# Patient Record
Sex: Male | Born: 1935 | Race: White | Hispanic: No | Marital: Married | Smoking: Former smoker
Health system: Southern US, Community
[De-identification: ages and names within clinical notes are randomized; demographics above are authoritative.]

## PROBLEM LIST (undated history)

## (undated) DIAGNOSIS — H5440 Blindness, one eye, unspecified eye: Secondary | ICD-10-CM

## (undated) DIAGNOSIS — I10 Essential (primary) hypertension: Secondary | ICD-10-CM

## (undated) DIAGNOSIS — I2510 Atherosclerotic heart disease of native coronary artery without angina pectoris: Secondary | ICD-10-CM

## (undated) DIAGNOSIS — S065X9A Traumatic subdural hemorrhage with loss of consciousness of unspecified duration, initial encounter: Secondary | ICD-10-CM

## (undated) DIAGNOSIS — R51 Headache: Secondary | ICD-10-CM

## (undated) DIAGNOSIS — D649 Anemia, unspecified: Secondary | ICD-10-CM

## (undated) DIAGNOSIS — R569 Unspecified convulsions: Secondary | ICD-10-CM

## (undated) DIAGNOSIS — S065XAA Traumatic subdural hemorrhage with loss of consciousness status unknown, initial encounter: Secondary | ICD-10-CM

## (undated) DIAGNOSIS — E785 Hyperlipidemia, unspecified: Secondary | ICD-10-CM

## (undated) DIAGNOSIS — M1990 Unspecified osteoarthritis, unspecified site: Secondary | ICD-10-CM

## (undated) HISTORY — PX: SHOULDER ARTHROSCOPY: SHX128

## (undated) HISTORY — PX: APPENDECTOMY: SHX54

## (undated) HISTORY — PX: REPLACEMENT TOTAL KNEE BILATERAL: SUR1225

---

## 1974-01-31 HISTORY — PX: OTHER SURGICAL HISTORY: SHX169

## 1979-02-01 HISTORY — PX: KNEE ARTHROSCOPY: SUR90

## 1987-02-01 HISTORY — PX: RETINAL DETACHMENT SURGERY: SHX105

## 1993-01-31 HISTORY — PX: CATARACT EXTRACTION: SUR2

## 1996-02-01 HISTORY — PX: KNEE ARTHROSCOPY: SUR90

## 1996-02-01 HISTORY — PX: KNEE SURGERY: SHX244

## 1997-05-01 ENCOUNTER — Encounter
Admission: RE | Admit: 1997-05-01 | Discharge: 1997-07-30 | Payer: Self-pay | Admitting: Physical Medicine and Rehabilitation

## 1998-10-10 ENCOUNTER — Ambulatory Visit (HOSPITAL_COMMUNITY): Admission: RE | Admit: 1998-10-10 | Discharge: 1998-10-10 | Payer: Self-pay | Admitting: *Deleted

## 1998-10-10 ENCOUNTER — Encounter: Payer: Self-pay | Admitting: *Deleted

## 1998-12-28 ENCOUNTER — Ambulatory Visit (HOSPITAL_BASED_OUTPATIENT_CLINIC_OR_DEPARTMENT_OTHER): Admission: RE | Admit: 1998-12-28 | Discharge: 1998-12-28 | Payer: Self-pay | Admitting: *Deleted

## 1999-02-01 HISTORY — PX: OTHER SURGICAL HISTORY: SHX169

## 1999-02-18 ENCOUNTER — Encounter: Admission: RE | Admit: 1999-02-18 | Discharge: 1999-03-15 | Payer: Self-pay | Admitting: *Deleted

## 1999-07-23 ENCOUNTER — Ambulatory Visit (HOSPITAL_COMMUNITY): Admission: RE | Admit: 1999-07-23 | Discharge: 1999-07-23 | Payer: Self-pay | Admitting: Cardiovascular Disease

## 1999-07-23 ENCOUNTER — Encounter: Payer: Self-pay | Admitting: Cardiovascular Disease

## 1999-12-24 ENCOUNTER — Encounter: Payer: Self-pay | Admitting: Orthopedic Surgery

## 1999-12-24 ENCOUNTER — Ambulatory Visit (HOSPITAL_COMMUNITY): Admission: RE | Admit: 1999-12-24 | Discharge: 1999-12-24 | Payer: Self-pay | Admitting: Orthopedic Surgery

## 2001-01-31 HISTORY — PX: KNEE SURGERY: SHX244

## 2001-07-30 ENCOUNTER — Inpatient Hospital Stay (HOSPITAL_COMMUNITY): Admission: RE | Admit: 2001-07-30 | Discharge: 2001-08-03 | Payer: Self-pay | Admitting: Orthopedic Surgery

## 2001-07-30 ENCOUNTER — Encounter: Payer: Self-pay | Admitting: Orthopedic Surgery

## 2003-02-01 HISTORY — PX: PENILE PROSTHESIS IMPLANT: SHX240

## 2003-04-28 ENCOUNTER — Observation Stay (HOSPITAL_COMMUNITY): Admission: RE | Admit: 2003-04-28 | Discharge: 2003-04-29 | Payer: Self-pay | Admitting: Urology

## 2003-11-12 ENCOUNTER — Observation Stay (HOSPITAL_COMMUNITY): Admission: RE | Admit: 2003-11-12 | Discharge: 2003-11-13 | Payer: Self-pay | Admitting: Orthopedic Surgery

## 2004-02-11 ENCOUNTER — Observation Stay (HOSPITAL_COMMUNITY): Admission: RE | Admit: 2004-02-11 | Discharge: 2004-02-12 | Payer: Self-pay | Admitting: Orthopedic Surgery

## 2004-11-26 ENCOUNTER — Encounter: Admission: RE | Admit: 2004-11-26 | Discharge: 2004-11-26 | Payer: Self-pay | Admitting: Oral Surgery

## 2005-01-04 ENCOUNTER — Ambulatory Visit (HOSPITAL_BASED_OUTPATIENT_CLINIC_OR_DEPARTMENT_OTHER): Admission: RE | Admit: 2005-01-04 | Discharge: 2005-01-04 | Payer: Self-pay | Admitting: Oral Surgery

## 2005-01-04 ENCOUNTER — Ambulatory Visit (HOSPITAL_COMMUNITY): Admission: RE | Admit: 2005-01-04 | Discharge: 2005-01-04 | Payer: Self-pay | Admitting: Oral Surgery

## 2006-05-29 ENCOUNTER — Ambulatory Visit: Payer: Self-pay | Admitting: Gastroenterology

## 2006-05-29 LAB — CONVERTED CEMR LAB
Basophils Absolute: 0 10*3/uL (ref 0.0–0.1)
Hemoglobin: 11 g/dL — ABNORMAL LOW (ref 13.0–17.0)
MCHC: 34.1 g/dL (ref 30.0–36.0)
Monocytes Absolute: 0.7 10*3/uL (ref 0.2–0.7)
Monocytes Relative: 10.5 % (ref 3.0–11.0)
RDW: 14.2 % (ref 11.5–14.6)
Transferrin: 371.6 mg/dL — ABNORMAL HIGH (ref >=212.0)
Vitamin B-12: 57 pg/mL — ABNORMAL LOW (ref 211–911)

## 2006-06-05 ENCOUNTER — Ambulatory Visit: Payer: Self-pay | Admitting: Gastroenterology

## 2006-06-12 ENCOUNTER — Ambulatory Visit: Payer: Self-pay | Admitting: Gastroenterology

## 2006-06-16 ENCOUNTER — Ambulatory Visit: Payer: Self-pay | Admitting: Gastroenterology

## 2006-06-19 ENCOUNTER — Ambulatory Visit: Payer: Self-pay | Admitting: Gastroenterology

## 2006-06-27 ENCOUNTER — Ambulatory Visit: Payer: Self-pay | Admitting: Gastroenterology

## 2006-07-13 ENCOUNTER — Encounter (INDEPENDENT_AMBULATORY_CARE_PROVIDER_SITE_OTHER): Payer: Self-pay | Admitting: Gastroenterology

## 2006-07-13 ENCOUNTER — Ambulatory Visit: Payer: Self-pay | Admitting: Gastroenterology

## 2006-07-28 ENCOUNTER — Ambulatory Visit: Payer: Self-pay | Admitting: Gastroenterology

## 2006-08-25 ENCOUNTER — Ambulatory Visit: Payer: Self-pay | Admitting: Internal Medicine

## 2006-09-25 ENCOUNTER — Ambulatory Visit: Payer: Self-pay | Admitting: Internal Medicine

## 2007-05-03 DIAGNOSIS — J301 Allergic rhinitis due to pollen: Secondary | ICD-10-CM

## 2007-05-03 DIAGNOSIS — J4489 Other specified chronic obstructive pulmonary disease: Secondary | ICD-10-CM | POA: Insufficient documentation

## 2007-05-03 DIAGNOSIS — J45909 Unspecified asthma, uncomplicated: Secondary | ICD-10-CM | POA: Insufficient documentation

## 2007-05-03 DIAGNOSIS — K449 Diaphragmatic hernia without obstruction or gangrene: Secondary | ICD-10-CM | POA: Insufficient documentation

## 2007-05-03 DIAGNOSIS — D649 Anemia, unspecified: Secondary | ICD-10-CM

## 2007-05-03 DIAGNOSIS — K222 Esophageal obstruction: Secondary | ICD-10-CM

## 2007-05-03 DIAGNOSIS — J449 Chronic obstructive pulmonary disease, unspecified: Secondary | ICD-10-CM

## 2007-05-03 DIAGNOSIS — E119 Type 2 diabetes mellitus without complications: Secondary | ICD-10-CM | POA: Insufficient documentation

## 2007-05-03 DIAGNOSIS — K5730 Diverticulosis of large intestine without perforation or abscess without bleeding: Secondary | ICD-10-CM | POA: Insufficient documentation

## 2007-05-03 DIAGNOSIS — R32 Unspecified urinary incontinence: Secondary | ICD-10-CM

## 2007-09-27 ENCOUNTER — Encounter: Payer: Self-pay | Admitting: Emergency Medicine

## 2007-09-27 ENCOUNTER — Inpatient Hospital Stay (HOSPITAL_COMMUNITY): Admission: AD | Admit: 2007-09-27 | Discharge: 2007-10-01 | Payer: Self-pay | Admitting: Cardiovascular Disease

## 2007-11-08 ENCOUNTER — Encounter (HOSPITAL_COMMUNITY): Admission: RE | Admit: 2007-11-08 | Discharge: 2008-01-30 | Payer: Self-pay | Admitting: Cardiovascular Disease

## 2008-02-01 ENCOUNTER — Encounter (HOSPITAL_COMMUNITY): Admission: RE | Admit: 2008-02-01 | Discharge: 2008-02-15 | Payer: Self-pay | Admitting: Cardiovascular Disease

## 2008-03-26 ENCOUNTER — Inpatient Hospital Stay (HOSPITAL_COMMUNITY): Admission: AD | Admit: 2008-03-26 | Discharge: 2008-04-14 | Payer: Self-pay | Admitting: Neurosurgery

## 2008-03-26 ENCOUNTER — Ambulatory Visit: Payer: Self-pay | Admitting: Critical Care Medicine

## 2008-03-26 ENCOUNTER — Encounter: Payer: Self-pay | Admitting: Emergency Medicine

## 2008-04-03 ENCOUNTER — Encounter (INDEPENDENT_AMBULATORY_CARE_PROVIDER_SITE_OTHER): Payer: Self-pay | Admitting: Neurosurgery

## 2008-04-10 ENCOUNTER — Ambulatory Visit: Payer: Self-pay | Admitting: Physical Medicine & Rehabilitation

## 2008-04-14 ENCOUNTER — Ambulatory Visit: Payer: Self-pay | Admitting: Physical Medicine & Rehabilitation

## 2008-04-14 ENCOUNTER — Inpatient Hospital Stay (HOSPITAL_COMMUNITY)
Admission: RE | Admit: 2008-04-14 | Discharge: 2008-04-30 | Payer: Self-pay | Admitting: Physical Medicine & Rehabilitation

## 2008-12-05 ENCOUNTER — Inpatient Hospital Stay (HOSPITAL_COMMUNITY): Admission: EM | Admit: 2008-12-05 | Discharge: 2008-12-09 | Payer: Self-pay | Admitting: Emergency Medicine

## 2008-12-05 ENCOUNTER — Encounter (INDEPENDENT_AMBULATORY_CARE_PROVIDER_SITE_OTHER): Payer: Self-pay | Admitting: Internal Medicine

## 2008-12-05 ENCOUNTER — Ambulatory Visit: Payer: Self-pay | Admitting: Vascular Surgery

## 2009-07-09 ENCOUNTER — Encounter (INDEPENDENT_AMBULATORY_CARE_PROVIDER_SITE_OTHER): Payer: Self-pay | Admitting: *Deleted

## 2010-03-02 NOTE — Letter (Signed)
Summary: Colonoscopy Letter  [facility]  [address] [place], [place] [address]   Phone: [phone]  Fax: [phone]      July 09, 2009 MRN: [id number]   [name] Walker [address] [place], [place]  [address]   Dear Mr. STOLZ,   According to your medical record, it is time for you to schedule a Colonoscopy. The American Cancer Society recommends this procedure as a method to detect early colon cancer. Patients with a family history of colon cancer, or a personal history of colon polyps or inflammatory bowel disease are at increased risk.  This letter has beeen generated based on the recommendations made at the time of your procedure. If you feel that in your particular situation this may no longer apply, please contact our office.  Please call our office at [phone] to schedule this appointment or to update your records at your earliest convenience.  Thank you for cooperating with [place] to provide you with the very best care possible.   Sincerely,   Iva Boop, M.D.  [facility] Gastroenterology Division [phone]

## 2010-03-16 IMAGING — CR DG THORACIC SPINE 2V
4 series · 4 of 4 positions shown · non-contrast
Comparison: CT chest 11/24/2008.

CLINICAL DATA: Seizure.  Pain.  Question fracture.

THORACIC SPINE - 2 VIEW

[t t-spine a.p.]
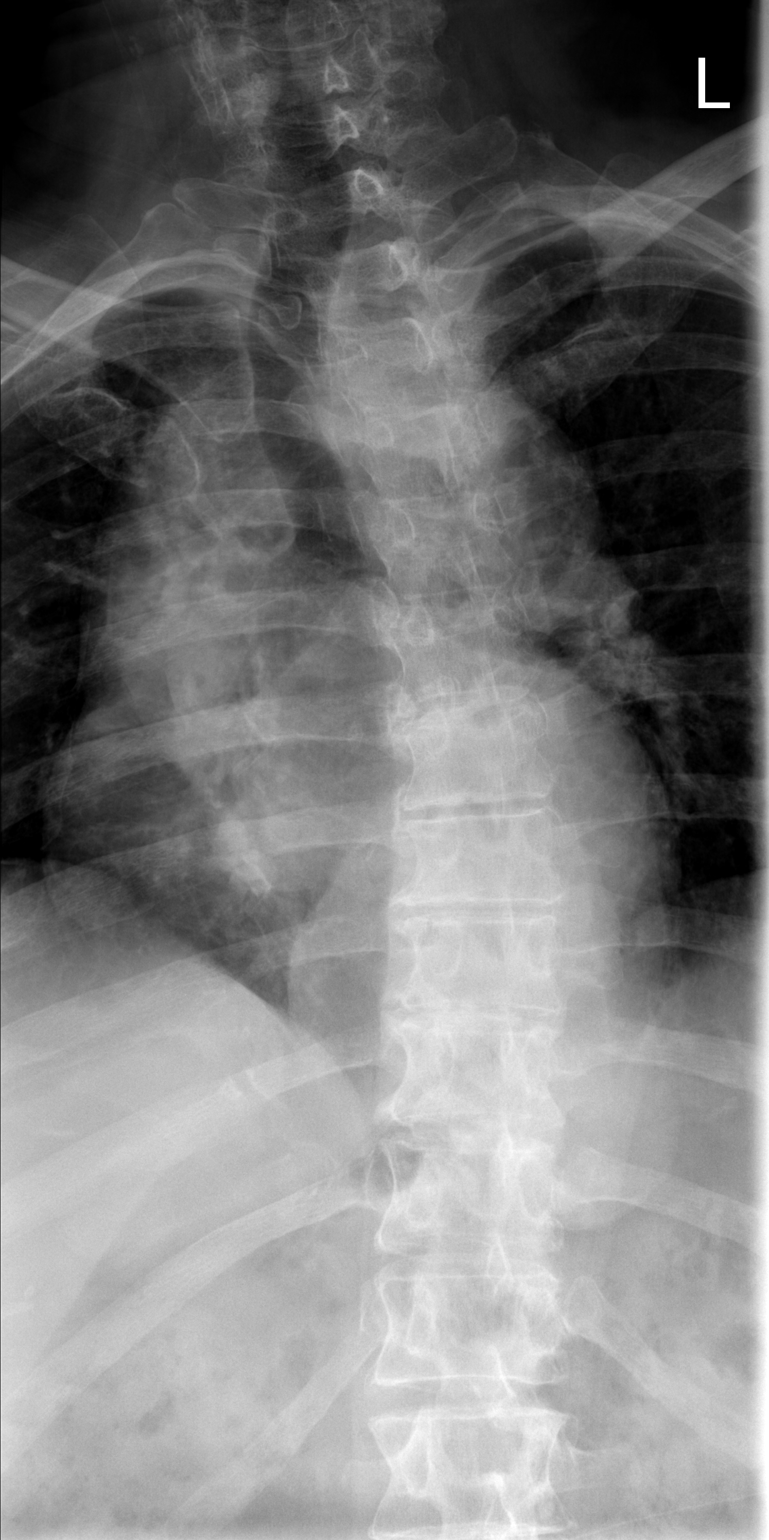

[t t-spine lat * (1 of 2)]
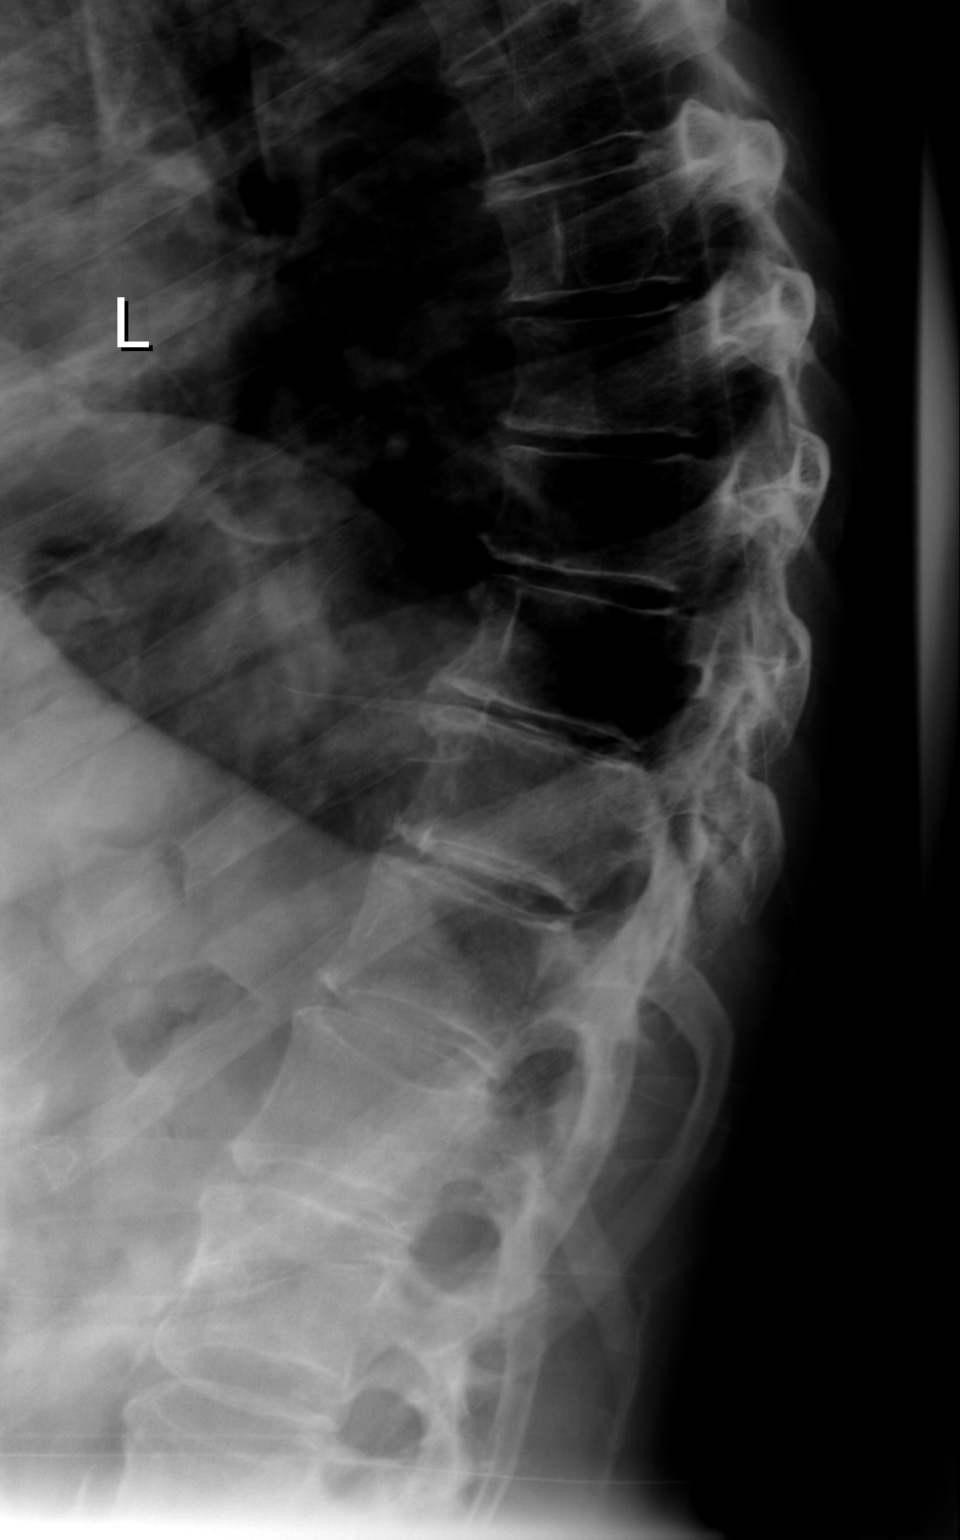

[t t-spine lat * (2 of 2)]
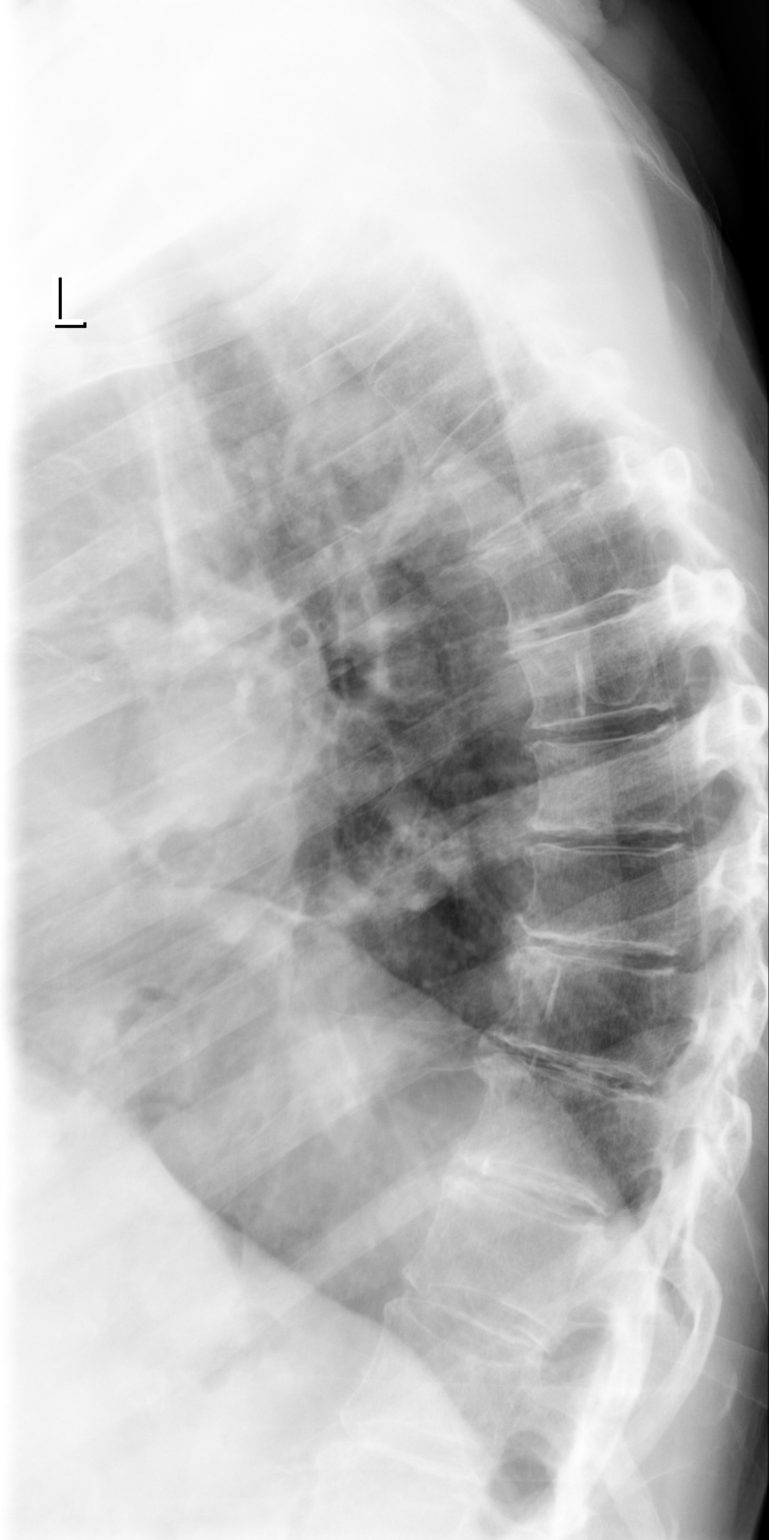

[t swimmers *]
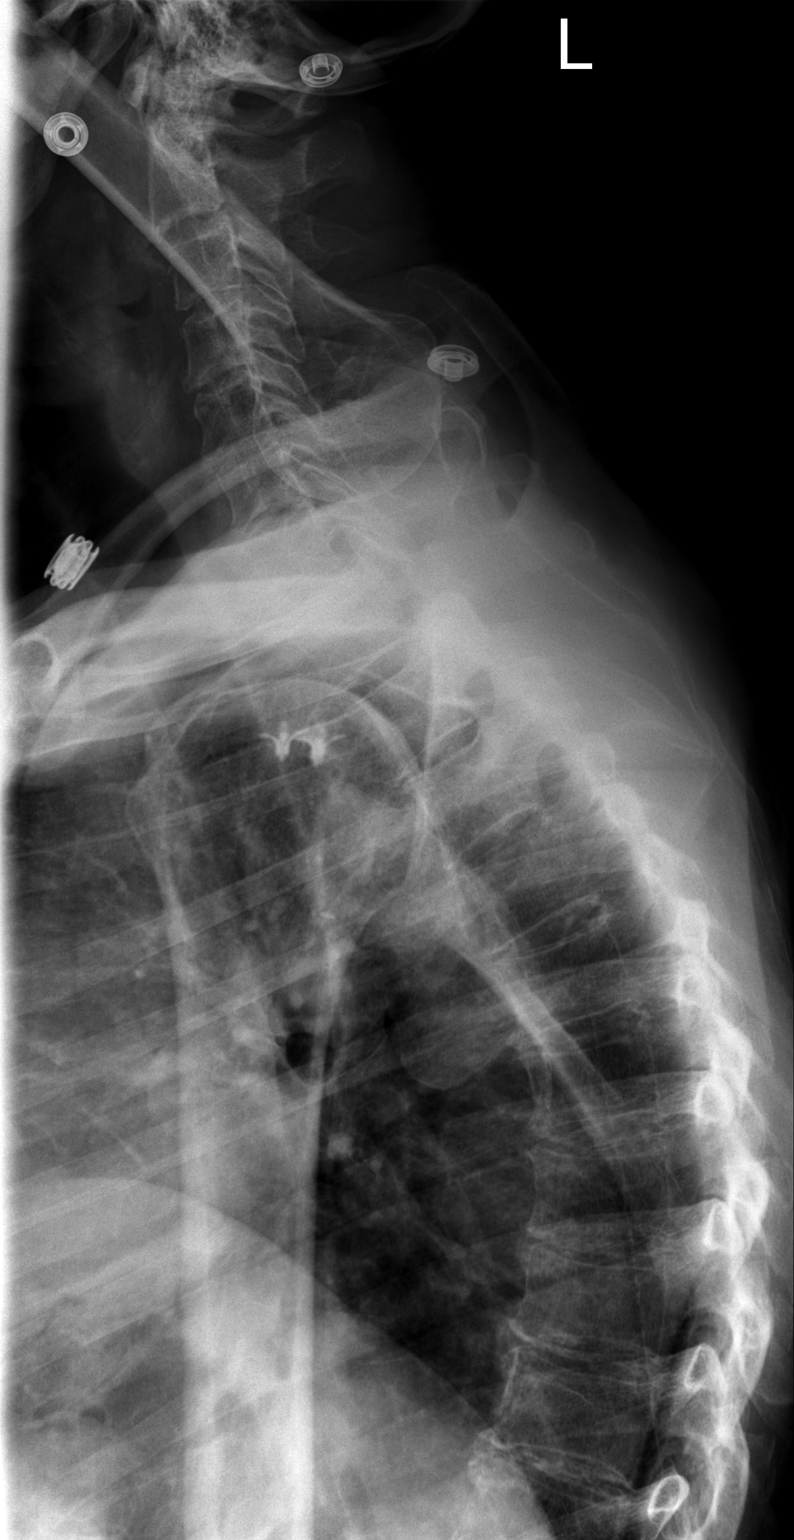

[4 of 4 positions shown; findings below may reference images not displayed]

FINDINGS: Scoliosis is noted.  There is no fracture or subluxation.
Multilevel anterior endplate spurring compatible with degenerative
disease noted.
IMPRESSION: 1.  Negative for fracture or focal abnormality.
2.  Scoliosis and spondylosis.

## 2010-05-05 LAB — COMPREHENSIVE METABOLIC PANEL
ALT: 13 U/L (ref 0–53)
ALT: 15 U/L (ref 0–53)
AST: 22 U/L (ref 0–37)
Albumin: 3.6 g/dL (ref 3.5–5.2)
Albumin: 4.1 g/dL (ref 3.5–5.2)
Alkaline Phosphatase: 51 U/L (ref 39–117)
Alkaline Phosphatase: 59 U/L (ref 39–117)
BUN: 13 mg/dL (ref 6–23)
CO2: 16 mEq/L — ABNORMAL LOW (ref 19–32)
Chloride: 102 mEq/L (ref 96–112)
Chloride: 95 mEq/L — ABNORMAL LOW (ref 96–112)
GFR calc Af Amer: >60 mL/min (ref >60)
GFR calc non Af Amer: >60 mL/min (ref >60)
Glucose, Bld: 590 mg/dL (ref 70–99)
Potassium: 3.9 mEq/L (ref 3.5–5.1)
Potassium: 4.8 mEq/L (ref 3.5–5.1)
Sodium: 132 mEq/L — ABNORMAL LOW (ref 135–145)
Total Bilirubin: 1.1 mg/dL (ref 0.3–1.2)
Total Bilirubin: 1.8 mg/dL — ABNORMAL HIGH (ref 0.3–1.2)

## 2010-05-05 LAB — POCT I-STAT, CHEM 8
BUN: 14 mg/dL (ref 6–23)
Calcium, Ion: 1.17 mmol/L (ref 1.12–1.32)
Glucose, Bld: 586 mg/dL (ref 70–99)
TCO2: 14 mmol/L (ref 0–100)

## 2010-05-05 LAB — URINE MICROSCOPIC-ADD ON

## 2010-05-05 LAB — GLUCOSE, CAPILLARY
Glucose-Capillary: 123 mg/dL — ABNORMAL HIGH (ref 70–99)
Glucose-Capillary: 124 mg/dL — ABNORMAL HIGH (ref 70–99)
Glucose-Capillary: 131 mg/dL — ABNORMAL HIGH (ref 70–99)
Glucose-Capillary: 147 mg/dL — ABNORMAL HIGH (ref 70–99)
Glucose-Capillary: 152 mg/dL — ABNORMAL HIGH (ref 70–99)
Glucose-Capillary: 164 mg/dL — ABNORMAL HIGH (ref 70–99)
Glucose-Capillary: 165 mg/dL — ABNORMAL HIGH (ref 70–99)
Glucose-Capillary: 202 mg/dL — ABNORMAL HIGH (ref 70–99)
Glucose-Capillary: 216 mg/dL — ABNORMAL HIGH (ref 70–99)
Glucose-Capillary: 254 mg/dL — ABNORMAL HIGH (ref 70–99)
Glucose-Capillary: 325 mg/dL — ABNORMAL HIGH (ref 70–99)
Glucose-Capillary: 339 mg/dL — ABNORMAL HIGH (ref 70–99)
Glucose-Capillary: 365 mg/dL — ABNORMAL HIGH (ref 70–99)
Glucose-Capillary: 565 mg/dL (ref 70–99)

## 2010-05-05 LAB — URINALYSIS, ROUTINE W REFLEX MICROSCOPIC
Leukocytes, UA: NEGATIVE
Nitrite: NEGATIVE
Specific Gravity, Urine: 1.035 — ABNORMAL HIGH (ref 1.005–1.030)
Urobilinogen, UA: 0.2 mg/dL (ref 0.0–1.0)

## 2010-05-05 LAB — BASIC METABOLIC PANEL
BUN: 7 mg/dL (ref 6–23)
CO2: 26 mEq/L (ref 19–32)
Chloride: 109 mEq/L (ref 96–112)
Creatinine, Ser: 0.92 mg/dL (ref 0.4–1.5)
GFR calc Af Amer: >60 mL/min (ref >60)

## 2010-05-05 LAB — APTT: aPTT: 26 seconds (ref 24–37)

## 2010-05-05 LAB — DIFFERENTIAL
Basophils Absolute: 0 10*3/uL (ref 0.0–0.1)
Basophils Relative: 0 % (ref 0–1)
Eosinophils Absolute: 0 10*3/uL (ref 0.0–0.7)
Monocytes Absolute: 1.1 10*3/uL — ABNORMAL HIGH (ref 0.1–1.0)
Neutro Abs: 9.6 10*3/uL — ABNORMAL HIGH (ref 1.7–7.7)
Neutrophils Relative %: 85 % — ABNORMAL HIGH (ref 43–77)

## 2010-05-05 LAB — CBC
HCT: 32.4 % — ABNORMAL LOW (ref 39.0–52.0)
HCT: 37.9 % — ABNORMAL LOW (ref 39.0–52.0)
Hemoglobin: 13.1 g/dL (ref 13.0–17.0)
MCHC: 34.8 g/dL (ref 30.0–36.0)
MCV: 93.9 fL (ref 78.0–100.0)
MCV: 94.5 fL (ref 78.0–100.0)
Platelets: 311 10*3/uL (ref 150–400)
RBC: 3.43 MIL/uL — ABNORMAL LOW (ref 4.22–5.81)
RBC: 3.76 MIL/uL — ABNORMAL LOW (ref 4.22–5.81)
WBC: 10.8 10*3/uL — ABNORMAL HIGH (ref 4.0–10.5)
WBC: 11.4 10*3/uL — ABNORMAL HIGH (ref 4.0–10.5)

## 2010-05-05 LAB — URINE CULTURE

## 2010-05-13 LAB — BASIC METABOLIC PANEL
BUN: 6 mg/dL (ref 6–23)
BUN: 11 mg/dL (ref 6–23)
BUN: 7 mg/dL (ref 6–23)
BUN: 8 mg/dL (ref 6–23)
BUN: 8 mg/dL (ref 6–23)
BUN: 9 mg/dL (ref 6–23)
CO2: 22 mEq/L (ref 19–32)
CO2: 18 mEq/L — ABNORMAL LOW (ref 19–32)
CO2: 21 mEq/L (ref 19–32)
CO2: 22 mEq/L (ref 19–32)
Calcium: 8.7 mg/dL (ref 8.4–10.5)
Calcium: 8.2 mg/dL — ABNORMAL LOW (ref 8.4–10.5)
Calcium: 8.5 mg/dL (ref 8.4–10.5)
Calcium: 8.6 mg/dL (ref 8.4–10.5)
Calcium: 8.7 mg/dL (ref 8.4–10.5)
Calcium: 9.3 mg/dL (ref 8.4–10.5)
Chloride: 102 mEq/L (ref 96–112)
Chloride: 105 mEq/L (ref 96–112)
Creatinine, Ser: 0.65 mg/dL (ref 0.4–1.5)
Creatinine, Ser: 0.96 mg/dL (ref 0.4–1.5)
Creatinine, Ser: 0.97 mg/dL (ref 0.4–1.5)
GFR calc Af Amer: >60 mL/min (ref >60)
GFR calc Af Amer: >60 mL/min (ref >60)
GFR calc Af Amer: >60 mL/min (ref >60)
GFR calc Af Amer: >60 mL/min (ref >60)
GFR calc Af Amer: >60 mL/min (ref >60)
GFR calc non Af Amer: >60 mL/min (ref >60)
GFR calc non Af Amer: >60 mL/min (ref >60)
GFR calc non Af Amer: >60 mL/min (ref >60)
GFR calc non Af Amer: >60 mL/min (ref >60)
GFR calc non Af Amer: >60 mL/min (ref >60)
GFR calc non Af Amer: >60 mL/min (ref >60)
GFR calc non Af Amer: >60 mL/min (ref >60)
GFR calc non Af Amer: >60 mL/min (ref >60)
GFR calc non Af Amer: >60 mL/min (ref >60)
Glucose, Bld: 167 mg/dL — ABNORMAL HIGH (ref 70–99)
Glucose, Bld: 192 mg/dL — ABNORMAL HIGH (ref 70–99)
Glucose, Bld: 100 mg/dL — ABNORMAL HIGH (ref 70–99)
Glucose, Bld: 142 mg/dL — ABNORMAL HIGH (ref 70–99)
Glucose, Bld: 164 mg/dL — ABNORMAL HIGH (ref 70–99)
Glucose, Bld: 199 mg/dL — ABNORMAL HIGH (ref 70–99)
Glucose, Bld: 232 mg/dL — ABNORMAL HIGH (ref 70–99)
Glucose, Bld: 239 mg/dL — ABNORMAL HIGH (ref 70–99)
Potassium: 3.6 mEq/L (ref 3.5–5.1)
Potassium: 4.3 mEq/L (ref 3.5–5.1)
Potassium: 3.1 mEq/L — ABNORMAL LOW (ref 3.5–5.1)
Potassium: 3.2 mEq/L — ABNORMAL LOW (ref 3.5–5.1)
Potassium: 3.5 mEq/L (ref 3.5–5.1)
Potassium: 3.5 mEq/L (ref 3.5–5.1)
Potassium: 3.9 mEq/L (ref 3.5–5.1)
Sodium: 137 mEq/L (ref 135–145)
Sodium: 135 mEq/L (ref 135–145)
Sodium: 136 mEq/L (ref 135–145)
Sodium: 140 mEq/L (ref 135–145)
Sodium: 141 mEq/L (ref 135–145)

## 2010-05-13 LAB — COMPREHENSIVE METABOLIC PANEL
ALT: 95 U/L — ABNORMAL HIGH (ref 0–53)
ALT: 21 U/L (ref 0–53)
AST: 85 U/L — ABNORMAL HIGH (ref 0–37)
AST: 22 U/L (ref 0–37)
Albumin: 3 g/dL — ABNORMAL LOW (ref 3.5–5.2)
Alkaline Phosphatase: 40 U/L (ref 39–117)
BUN: 9 mg/dL (ref 6–23)
CO2: 21 mEq/L (ref 19–32)
CO2: 15 mEq/L — ABNORMAL LOW (ref 19–32)
Calcium: 9.2 mg/dL (ref 8.4–10.5)
Chloride: 106 mEq/L (ref 96–112)
Chloride: 109 mEq/L (ref 96–112)
Creatinine, Ser: 0.87 mg/dL (ref 0.4–1.5)
GFR calc Af Amer: >60 mL/min (ref >60)
GFR calc Af Amer: >60 mL/min (ref >60)
GFR calc non Af Amer: >60 mL/min (ref >60)
GFR calc non Af Amer: >60 mL/min (ref >60)
Glucose, Bld: 238 mg/dL — ABNORMAL HIGH (ref 70–99)
Potassium: 4.7 mEq/L (ref 3.5–5.1)
Sodium: 140 mEq/L (ref 135–145)
Sodium: 137 mEq/L (ref 135–145)
Total Bilirubin: 0.8 mg/dL (ref 0.3–1.2)
Total Bilirubin: 1.1 mg/dL (ref 0.3–1.2)
Total Protein: 6.7 g/dL (ref 6.0–8.3)

## 2010-05-13 LAB — GLUCOSE, CAPILLARY
Glucose-Capillary: 114 mg/dL — ABNORMAL HIGH (ref 70–99)
Glucose-Capillary: 120 mg/dL — ABNORMAL HIGH (ref 70–99)
Glucose-Capillary: 126 mg/dL — ABNORMAL HIGH (ref 70–99)
Glucose-Capillary: 132 mg/dL — ABNORMAL HIGH (ref 70–99)
Glucose-Capillary: 133 mg/dL — ABNORMAL HIGH (ref 70–99)
Glucose-Capillary: 136 mg/dL — ABNORMAL HIGH (ref 70–99)
Glucose-Capillary: 137 mg/dL — ABNORMAL HIGH (ref 70–99)
Glucose-Capillary: 139 mg/dL — ABNORMAL HIGH (ref 70–99)
Glucose-Capillary: 143 mg/dL — ABNORMAL HIGH (ref 70–99)
Glucose-Capillary: 144 mg/dL — ABNORMAL HIGH (ref 70–99)
Glucose-Capillary: 149 mg/dL — ABNORMAL HIGH (ref 70–99)
Glucose-Capillary: 149 mg/dL — ABNORMAL HIGH (ref 70–99)
Glucose-Capillary: 151 mg/dL — ABNORMAL HIGH (ref 70–99)
Glucose-Capillary: 162 mg/dL — ABNORMAL HIGH (ref 70–99)
Glucose-Capillary: 164 mg/dL — ABNORMAL HIGH (ref 70–99)
Glucose-Capillary: 170 mg/dL — ABNORMAL HIGH (ref 70–99)
Glucose-Capillary: 170 mg/dL — ABNORMAL HIGH (ref 70–99)
Glucose-Capillary: 172 mg/dL — ABNORMAL HIGH (ref 70–99)
Glucose-Capillary: 174 mg/dL — ABNORMAL HIGH (ref 70–99)
Glucose-Capillary: 183 mg/dL — ABNORMAL HIGH (ref 70–99)
Glucose-Capillary: 185 mg/dL — ABNORMAL HIGH (ref 70–99)
Glucose-Capillary: 186 mg/dL — ABNORMAL HIGH (ref 70–99)
Glucose-Capillary: 187 mg/dL — ABNORMAL HIGH (ref 70–99)
Glucose-Capillary: 195 mg/dL — ABNORMAL HIGH (ref 70–99)
Glucose-Capillary: 196 mg/dL — ABNORMAL HIGH (ref 70–99)
Glucose-Capillary: 201 mg/dL — ABNORMAL HIGH (ref 70–99)
Glucose-Capillary: 253 mg/dL — ABNORMAL HIGH (ref 70–99)
Glucose-Capillary: 50 mg/dL — ABNORMAL LOW (ref 70–99)
Glucose-Capillary: 67 mg/dL — ABNORMAL LOW (ref 70–99)
Glucose-Capillary: 71 mg/dL (ref 70–99)
Glucose-Capillary: 76 mg/dL (ref 70–99)
Glucose-Capillary: 80 mg/dL (ref 70–99)
Glucose-Capillary: 95 mg/dL (ref 70–99)
Glucose-Capillary: 112 mg/dL — ABNORMAL HIGH (ref 70–99)
Glucose-Capillary: 113 mg/dL — ABNORMAL HIGH (ref 70–99)
Glucose-Capillary: 115 mg/dL — ABNORMAL HIGH (ref 70–99)
Glucose-Capillary: 115 mg/dL — ABNORMAL HIGH (ref 70–99)
Glucose-Capillary: 116 mg/dL — ABNORMAL HIGH (ref 70–99)
Glucose-Capillary: 116 mg/dL — ABNORMAL HIGH (ref 70–99)
Glucose-Capillary: 116 mg/dL — ABNORMAL HIGH (ref 70–99)
Glucose-Capillary: 117 mg/dL — ABNORMAL HIGH (ref 70–99)
Glucose-Capillary: 117 mg/dL — ABNORMAL HIGH (ref 70–99)
Glucose-Capillary: 118 mg/dL — ABNORMAL HIGH (ref 70–99)
Glucose-Capillary: 122 mg/dL — ABNORMAL HIGH (ref 70–99)
Glucose-Capillary: 122 mg/dL — ABNORMAL HIGH (ref 70–99)
Glucose-Capillary: 124 mg/dL — ABNORMAL HIGH (ref 70–99)
Glucose-Capillary: 125 mg/dL — ABNORMAL HIGH (ref 70–99)
Glucose-Capillary: 125 mg/dL — ABNORMAL HIGH (ref 70–99)
Glucose-Capillary: 125 mg/dL — ABNORMAL HIGH (ref 70–99)
Glucose-Capillary: 126 mg/dL — ABNORMAL HIGH (ref 70–99)
Glucose-Capillary: 126 mg/dL — ABNORMAL HIGH (ref 70–99)
Glucose-Capillary: 127 mg/dL — ABNORMAL HIGH (ref 70–99)
Glucose-Capillary: 128 mg/dL — ABNORMAL HIGH (ref 70–99)
Glucose-Capillary: 128 mg/dL — ABNORMAL HIGH (ref 70–99)
Glucose-Capillary: 129 mg/dL — ABNORMAL HIGH (ref 70–99)
Glucose-Capillary: 131 mg/dL — ABNORMAL HIGH (ref 70–99)
Glucose-Capillary: 134 mg/dL — ABNORMAL HIGH (ref 70–99)
Glucose-Capillary: 134 mg/dL — ABNORMAL HIGH (ref 70–99)
Glucose-Capillary: 134 mg/dL — ABNORMAL HIGH (ref 70–99)
Glucose-Capillary: 137 mg/dL — ABNORMAL HIGH (ref 70–99)
Glucose-Capillary: 137 mg/dL — ABNORMAL HIGH (ref 70–99)
Glucose-Capillary: 138 mg/dL — ABNORMAL HIGH (ref 70–99)
Glucose-Capillary: 139 mg/dL — ABNORMAL HIGH (ref 70–99)
Glucose-Capillary: 141 mg/dL — ABNORMAL HIGH (ref 70–99)
Glucose-Capillary: 142 mg/dL — ABNORMAL HIGH (ref 70–99)
Glucose-Capillary: 145 mg/dL — ABNORMAL HIGH (ref 70–99)
Glucose-Capillary: 147 mg/dL — ABNORMAL HIGH (ref 70–99)
Glucose-Capillary: 148 mg/dL — ABNORMAL HIGH (ref 70–99)
Glucose-Capillary: 151 mg/dL — ABNORMAL HIGH (ref 70–99)
Glucose-Capillary: 152 mg/dL — ABNORMAL HIGH (ref 70–99)
Glucose-Capillary: 153 mg/dL — ABNORMAL HIGH (ref 70–99)
Glucose-Capillary: 154 mg/dL — ABNORMAL HIGH (ref 70–99)
Glucose-Capillary: 154 mg/dL — ABNORMAL HIGH (ref 70–99)
Glucose-Capillary: 156 mg/dL — ABNORMAL HIGH (ref 70–99)
Glucose-Capillary: 158 mg/dL — ABNORMAL HIGH (ref 70–99)
Glucose-Capillary: 160 mg/dL — ABNORMAL HIGH (ref 70–99)
Glucose-Capillary: 161 mg/dL — ABNORMAL HIGH (ref 70–99)
Glucose-Capillary: 165 mg/dL — ABNORMAL HIGH (ref 70–99)
Glucose-Capillary: 166 mg/dL — ABNORMAL HIGH (ref 70–99)
Glucose-Capillary: 167 mg/dL — ABNORMAL HIGH (ref 70–99)
Glucose-Capillary: 167 mg/dL — ABNORMAL HIGH (ref 70–99)
Glucose-Capillary: 168 mg/dL — ABNORMAL HIGH (ref 70–99)
Glucose-Capillary: 168 mg/dL — ABNORMAL HIGH (ref 70–99)
Glucose-Capillary: 173 mg/dL — ABNORMAL HIGH (ref 70–99)
Glucose-Capillary: 174 mg/dL — ABNORMAL HIGH (ref 70–99)
Glucose-Capillary: 175 mg/dL — ABNORMAL HIGH (ref 70–99)
Glucose-Capillary: 178 mg/dL — ABNORMAL HIGH (ref 70–99)
Glucose-Capillary: 179 mg/dL — ABNORMAL HIGH (ref 70–99)
Glucose-Capillary: 180 mg/dL — ABNORMAL HIGH (ref 70–99)
Glucose-Capillary: 180 mg/dL — ABNORMAL HIGH (ref 70–99)
Glucose-Capillary: 182 mg/dL — ABNORMAL HIGH (ref 70–99)
Glucose-Capillary: 183 mg/dL — ABNORMAL HIGH (ref 70–99)
Glucose-Capillary: 183 mg/dL — ABNORMAL HIGH (ref 70–99)
Glucose-Capillary: 185 mg/dL — ABNORMAL HIGH (ref 70–99)
Glucose-Capillary: 187 mg/dL — ABNORMAL HIGH (ref 70–99)
Glucose-Capillary: 194 mg/dL — ABNORMAL HIGH (ref 70–99)
Glucose-Capillary: 199 mg/dL — ABNORMAL HIGH (ref 70–99)
Glucose-Capillary: 212 mg/dL — ABNORMAL HIGH (ref 70–99)
Glucose-Capillary: 215 mg/dL — ABNORMAL HIGH (ref 70–99)
Glucose-Capillary: 216 mg/dL — ABNORMAL HIGH (ref 70–99)
Glucose-Capillary: 218 mg/dL — ABNORMAL HIGH (ref 70–99)
Glucose-Capillary: 220 mg/dL — ABNORMAL HIGH (ref 70–99)
Glucose-Capillary: 223 mg/dL — ABNORMAL HIGH (ref 70–99)
Glucose-Capillary: 223 mg/dL — ABNORMAL HIGH (ref 70–99)
Glucose-Capillary: 226 mg/dL — ABNORMAL HIGH (ref 70–99)
Glucose-Capillary: 227 mg/dL — ABNORMAL HIGH (ref 70–99)
Glucose-Capillary: 227 mg/dL — ABNORMAL HIGH (ref 70–99)
Glucose-Capillary: 230 mg/dL — ABNORMAL HIGH (ref 70–99)
Glucose-Capillary: 232 mg/dL — ABNORMAL HIGH (ref 70–99)
Glucose-Capillary: 234 mg/dL — ABNORMAL HIGH (ref 70–99)
Glucose-Capillary: 237 mg/dL — ABNORMAL HIGH (ref 70–99)
Glucose-Capillary: 257 mg/dL — ABNORMAL HIGH (ref 70–99)
Glucose-Capillary: 270 mg/dL — ABNORMAL HIGH (ref 70–99)
Glucose-Capillary: 289 mg/dL — ABNORMAL HIGH (ref 70–99)
Glucose-Capillary: 385 mg/dL — ABNORMAL HIGH (ref 70–99)
Glucose-Capillary: 72 mg/dL (ref 70–99)
Glucose-Capillary: 79 mg/dL (ref 70–99)
Glucose-Capillary: 81 mg/dL (ref 70–99)
Glucose-Capillary: 87 mg/dL (ref 70–99)
Glucose-Capillary: 96 mg/dL (ref 70–99)
Glucose-Capillary: 98 mg/dL (ref 70–99)

## 2010-05-13 LAB — BLOOD GAS, ARTERIAL
Acid-base deficit: 10.5 mmol/L — ABNORMAL HIGH (ref 0.0–2.0)
Acid-base deficit: 4.6 mmol/L — ABNORMAL HIGH (ref 0.0–2.0)
Bicarbonate: 13.2 mEq/L — ABNORMAL LOW (ref 20.0–24.0)
Bicarbonate: 18.6 mEq/L — ABNORMAL LOW (ref 20.0–24.0)
Bicarbonate: 21.2 mEq/L (ref 20.0–24.0)
Drawn by: 31004
FIO2: 0.28 %
FIO2: 50 %
O2 Saturation: 97.8 %
O2 Saturation: 99.3 %
PEEP: 5 cmH2O
Patient temperature: 101.3
Patient temperature: 98.9
RATE: 14 resp/min
TCO2: 13.8 mmol/L (ref 0–100)
pCO2 arterial: 22.4 mmHg — ABNORMAL LOW (ref 35.0–45.0)
pCO2 arterial: 28.1 mmHg — ABNORMAL LOW (ref 35.0–45.0)
pCO2 arterial: 30.2 mmHg — ABNORMAL LOW (ref 35.0–45.0)
pH, Arterial: 7.395 (ref 7.350–7.450)
pH, Arterial: 7.461 — ABNORMAL HIGH (ref 7.350–7.450)
pO2, Arterial: 106 mmHg — ABNORMAL HIGH (ref 80.0–100.0)
pO2, Arterial: 153 mmHg — ABNORMAL HIGH (ref 80.0–100.0)

## 2010-05-13 LAB — CBC
HCT: 28.7 % — ABNORMAL LOW (ref 39.0–52.0)
HCT: 29 % — ABNORMAL LOW (ref 39.0–52.0)
HCT: 30.8 % — ABNORMAL LOW (ref 39.0–52.0)
HCT: 31.2 % — ABNORMAL LOW (ref 39.0–52.0)
HCT: 31.4 % — ABNORMAL LOW (ref 39.0–52.0)
HCT: 31.5 % — ABNORMAL LOW (ref 39.0–52.0)
Hemoglobin: 11.4 g/dL — ABNORMAL LOW (ref 13.0–17.0)
Hemoglobin: 10 g/dL — ABNORMAL LOW (ref 13.0–17.0)
Hemoglobin: 10.5 g/dL — ABNORMAL LOW (ref 13.0–17.0)
Hemoglobin: 10.7 g/dL — ABNORMAL LOW (ref 13.0–17.0)
Hemoglobin: 10.9 g/dL — ABNORMAL LOW (ref 13.0–17.0)
Hemoglobin: 11 g/dL — ABNORMAL LOW (ref 13.0–17.0)
MCHC: 34.2 g/dL (ref 30.0–36.0)
MCHC: 34.4 g/dL (ref 30.0–36.0)
MCHC: 34.4 g/dL (ref 30.0–36.0)
MCHC: 34.7 g/dL (ref 30.0–36.0)
MCV: 91.4 fL (ref 78.0–100.0)
MCV: 91.5 fL (ref 78.0–100.0)
MCV: 92.3 fL (ref 78.0–100.0)
MCV: 93.3 fL (ref 78.0–100.0)
Platelets: 358 10*3/uL (ref 150–400)
Platelets: 400 10*3/uL (ref 150–400)
Platelets: 435 10*3/uL — ABNORMAL HIGH (ref 150–400)
Platelets: 464 10*3/uL — ABNORMAL HIGH (ref 150–400)
Platelets: 556 10*3/uL — ABNORMAL HIGH (ref 150–400)
Platelets: 601 10*3/uL — ABNORMAL HIGH (ref 150–400)
RBC: 3.63 MIL/uL — ABNORMAL LOW (ref 4.22–5.81)
RBC: 3.16 MIL/uL — ABNORMAL LOW (ref 4.22–5.81)
RBC: 3.3 MIL/uL — ABNORMAL LOW (ref 4.22–5.81)
RBC: 3.38 MIL/uL — ABNORMAL LOW (ref 4.22–5.81)
RBC: 3.43 MIL/uL — ABNORMAL LOW (ref 4.22–5.81)
RBC: 3.46 MIL/uL — ABNORMAL LOW (ref 4.22–5.81)
RDW: 14.6 % (ref 11.5–15.5)
RDW: 13.7 % (ref 11.5–15.5)
RDW: 13.7 % (ref 11.5–15.5)
RDW: 13.8 % (ref 11.5–15.5)
RDW: 13.9 % (ref 11.5–15.5)
RDW: 14 % (ref 11.5–15.5)
RDW: 14.2 % (ref 11.5–15.5)
WBC: 4.4 10*3/uL (ref 4.0–10.5)
WBC: 9.5 10*3/uL (ref 4.0–10.5)
WBC: 11.7 10*3/uL — ABNORMAL HIGH (ref 4.0–10.5)
WBC: 5.6 10*3/uL (ref 4.0–10.5)
WBC: 5.9 10*3/uL (ref 4.0–10.5)
WBC: 6.3 10*3/uL (ref 4.0–10.5)
WBC: 7.2 10*3/uL (ref 4.0–10.5)
WBC: 7.5 10*3/uL (ref 4.0–10.5)

## 2010-05-13 LAB — DIFFERENTIAL
Basophils Absolute: 0 10*3/uL (ref 0.0–0.1)
Basophils Absolute: 0 10*3/uL (ref 0.0–0.1)
Basophils Absolute: 0 10*3/uL (ref 0.0–0.1)
Basophils Relative: 0 % (ref 0–1)
Basophils Relative: 1 % (ref 0–1)
Eosinophils Absolute: 0.4 10*3/uL (ref 0.0–0.7)
Eosinophils Absolute: 0.1 10*3/uL (ref 0.0–0.7)
Eosinophils Absolute: 0.3 10*3/uL (ref 0.0–0.7)
Eosinophils Relative: 4 % (ref 0–5)
Eosinophils Relative: 2 % (ref 0–5)
Eosinophils Relative: 4 % (ref 0–5)
Lymphocytes Relative: 15 % (ref 12–46)
Lymphocytes Relative: 18 % (ref 12–46)
Lymphs Abs: 1.1 10*3/uL (ref 0.7–4.0)
Lymphs Abs: 1.3 10*3/uL (ref 0.7–4.0)
Monocytes Absolute: 1 10*3/uL (ref 0.1–1.0)
Monocytes Absolute: 1 10*3/uL (ref 0.1–1.0)
Monocytes Relative: 13 % — ABNORMAL HIGH (ref 3–12)
Monocytes Relative: 14 % — ABNORMAL HIGH (ref 3–12)
Neutro Abs: 4.6 10*3/uL (ref 1.7–7.7)
Neutro Abs: 5.2 10*3/uL (ref 1.7–7.7)
Neutrophils Relative %: 64 % (ref 43–77)
Neutrophils Relative %: 70 % (ref 43–77)

## 2010-05-13 LAB — CROSSMATCH
ABO/RH(D): O NEG
Antibody Screen: NEGATIVE

## 2010-05-13 LAB — POCT I-STAT 7, (LYTES, BLD GAS, ICA,H+H)
Acid-base deficit: 10 mmol/L — ABNORMAL HIGH (ref 0.0–2.0)
Bicarbonate: 15.7 mEq/L — ABNORMAL LOW (ref 20.0–24.0)
Calcium, Ion: 1.21 mmol/L (ref 1.12–1.32)
HCT: 24 % — ABNORMAL LOW (ref 39.0–52.0)
Hemoglobin: 8.2 g/dL — ABNORMAL LOW (ref 13.0–17.0)
O2 Saturation: 100 %
Patient temperature: 36.6
Potassium: 4.9 mEq/L (ref 3.5–5.1)
Sodium: 138 mEq/L (ref 135–145)
TCO2: 17 mmol/L (ref 0–100)
pCO2 arterial: 33.5 mmHg — ABNORMAL LOW (ref 35.0–45.0)
pH, Arterial: 7.278 — ABNORMAL LOW (ref 7.350–7.450)
pO2, Arterial: 346 mmHg — ABNORMAL HIGH (ref 80.0–100.0)

## 2010-05-13 LAB — TYPE AND SCREEN
ABO/RH(D): O NEG
Antibody Screen: NEGATIVE

## 2010-05-13 LAB — PREPARE PLATELETS

## 2010-05-13 LAB — HEMOGLOBIN A1C
Hgb A1c MFr Bld: 6.8 % — ABNORMAL HIGH (ref 4.6–6.1)
Mean Plasma Glucose: 148 mg/dL

## 2010-05-13 LAB — POCT I-STAT GLUCOSE
Glucose, Bld: 216 mg/dL — ABNORMAL HIGH (ref 70–99)
Operator id: 153281

## 2010-05-13 LAB — SODIUM: Sodium: 136 mEq/L (ref 135–145)

## 2010-05-18 LAB — DIFFERENTIAL
Basophils Absolute: 0.1 10*3/uL (ref 0.0–0.1)
Basophils Relative: 1 % (ref 0–1)
Eosinophils Absolute: 0.2 10*3/uL (ref 0.0–0.7)
Eosinophils Absolute: 0 10*3/uL (ref 0.0–0.7)
Eosinophils Relative: 2 % (ref 0–5)
Eosinophils Relative: 0 % (ref 0–5)
Lymphocytes Relative: 9 % — ABNORMAL LOW (ref 12–46)
Lymphocytes Relative: 9 % — ABNORMAL LOW (ref 12–46)
Lymphs Abs: 1 10*3/uL (ref 0.7–4.0)
Lymphs Abs: 0.9 10*3/uL (ref 0.7–4.0)
Monocytes Absolute: 1.3 10*3/uL — ABNORMAL HIGH (ref 0.1–1.0)
Monocytes Absolute: 1.3 10*3/uL — ABNORMAL HIGH (ref 0.1–1.0)
Monocytes Relative: 11 % (ref 3–12)
Monocytes Relative: 14 % — ABNORMAL HIGH (ref 3–12)
Neutro Abs: 7.2 10*3/uL (ref 1.7–7.7)
Neutrophils Relative %: 76 % (ref 43–77)

## 2010-05-18 LAB — CBC
HCT: 31.9 % — ABNORMAL LOW (ref 39.0–52.0)
Hemoglobin: 11 g/dL — ABNORMAL LOW (ref 13.0–17.0)
MCHC: 33.4 g/dL (ref 30.0–36.0)
MCHC: 34.4 g/dL (ref 30.0–36.0)
MCV: 92.7 fL (ref 78.0–100.0)
MCV: 93.1 fL (ref 78.0–100.0)
Platelets: 278 10*3/uL (ref 150–400)
Platelets: 264 10*3/uL (ref 150–400)
RBC: 3.53 MIL/uL — ABNORMAL LOW (ref 4.22–5.81)
RBC: 3.43 MIL/uL — ABNORMAL LOW (ref 4.22–5.81)
RDW: 14.2 % (ref 11.5–15.5)
RDW: 14.1 % (ref 11.5–15.5)
WBC: 9.5 10*3/uL (ref 4.0–10.5)

## 2010-05-18 LAB — URINALYSIS, ROUTINE W REFLEX MICROSCOPIC
Glucose, UA: NEGATIVE mg/dL
Leukocytes, UA: NEGATIVE
Protein, ur: 100 mg/dL — AB
Specific Gravity, Urine: 1.024 (ref 1.005–1.030)
Urobilinogen, UA: 0.2 mg/dL (ref 0.0–1.0)

## 2010-05-18 LAB — BASIC METABOLIC PANEL
BUN: 6 mg/dL (ref 6–23)
BUN: 6 mg/dL (ref 6–23)
BUN: 6 mg/dL (ref 6–23)
CO2: 20 mEq/L (ref 19–32)
CO2: 20 mEq/L (ref 19–32)
CO2: 21 mEq/L (ref 19–32)
Calcium: 7.7 mg/dL — ABNORMAL LOW (ref 8.4–10.5)
Calcium: 7.8 mg/dL — ABNORMAL LOW (ref 8.4–10.5)
Calcium: 8.3 mg/dL — ABNORMAL LOW (ref 8.4–10.5)
Chloride: 101 mEq/L (ref 96–112)
Chloride: 97 mEq/L (ref 96–112)
Chloride: 99 mEq/L (ref 96–112)
Creatinine, Ser: 0.82 mg/dL (ref 0.4–1.5)
Creatinine, Ser: 0.83 mg/dL (ref 0.4–1.5)
Creatinine, Ser: 0.89 mg/dL (ref 0.4–1.5)
GFR calc Af Amer: >60 mL/min (ref >60)
GFR calc Af Amer: >60 mL/min (ref >60)
GFR calc Af Amer: >60 mL/min (ref >60)
GFR calc non Af Amer: >60 mL/min (ref >60)
GFR calc non Af Amer: >60 mL/min (ref >60)
GFR calc non Af Amer: >60 mL/min (ref >60)
Glucose, Bld: 187 mg/dL — ABNORMAL HIGH (ref 70–99)
Glucose, Bld: 207 mg/dL — ABNORMAL HIGH (ref 70–99)
Glucose, Bld: 220 mg/dL — ABNORMAL HIGH (ref 70–99)
Potassium: 4.2 mEq/L (ref 3.5–5.1)
Potassium: 4.2 mEq/L (ref 3.5–5.1)
Potassium: 4.2 mEq/L (ref 3.5–5.1)
Sodium: 127 mEq/L — ABNORMAL LOW (ref 135–145)
Sodium: 129 mEq/L — ABNORMAL LOW (ref 135–145)
Sodium: 129 mEq/L — ABNORMAL LOW (ref 135–145)

## 2010-05-18 LAB — GLUCOSE, CAPILLARY
Glucose-Capillary: 126 mg/dL — ABNORMAL HIGH (ref 70–99)
Glucose-Capillary: 135 mg/dL — ABNORMAL HIGH (ref 70–99)
Glucose-Capillary: 138 mg/dL — ABNORMAL HIGH (ref 70–99)
Glucose-Capillary: 144 mg/dL — ABNORMAL HIGH (ref 70–99)
Glucose-Capillary: 149 mg/dL — ABNORMAL HIGH (ref 70–99)
Glucose-Capillary: 151 mg/dL — ABNORMAL HIGH (ref 70–99)
Glucose-Capillary: 152 mg/dL — ABNORMAL HIGH (ref 70–99)
Glucose-Capillary: 154 mg/dL — ABNORMAL HIGH (ref 70–99)
Glucose-Capillary: 163 mg/dL — ABNORMAL HIGH (ref 70–99)
Glucose-Capillary: 163 mg/dL — ABNORMAL HIGH (ref 70–99)
Glucose-Capillary: 165 mg/dL — ABNORMAL HIGH (ref 70–99)
Glucose-Capillary: 169 mg/dL — ABNORMAL HIGH (ref 70–99)
Glucose-Capillary: 170 mg/dL — ABNORMAL HIGH (ref 70–99)
Glucose-Capillary: 171 mg/dL — ABNORMAL HIGH (ref 70–99)
Glucose-Capillary: 173 mg/dL — ABNORMAL HIGH (ref 70–99)
Glucose-Capillary: 174 mg/dL — ABNORMAL HIGH (ref 70–99)
Glucose-Capillary: 174 mg/dL — ABNORMAL HIGH (ref 70–99)
Glucose-Capillary: 180 mg/dL — ABNORMAL HIGH (ref 70–99)
Glucose-Capillary: 184 mg/dL — ABNORMAL HIGH (ref 70–99)
Glucose-Capillary: 185 mg/dL — ABNORMAL HIGH (ref 70–99)
Glucose-Capillary: 207 mg/dL — ABNORMAL HIGH (ref 70–99)
Glucose-Capillary: 210 mg/dL — ABNORMAL HIGH (ref 70–99)
Glucose-Capillary: 224 mg/dL — ABNORMAL HIGH (ref 70–99)
Glucose-Capillary: 252 mg/dL — ABNORMAL HIGH (ref 70–99)
Glucose-Capillary: 256 mg/dL — ABNORMAL HIGH (ref 70–99)

## 2010-05-18 LAB — COMPREHENSIVE METABOLIC PANEL
ALT: 13 U/L (ref 0–53)
AST: 20 U/L (ref 0–37)
Albumin: 3.7 g/dL (ref 3.5–5.2)
Calcium: 9.1 mg/dL (ref 8.4–10.5)
Creatinine, Ser: 0.89 mg/dL (ref 0.4–1.5)
GFR calc Af Amer: >60 mL/min (ref >60)
GFR calc non Af Amer: >60 mL/min (ref >60)
Sodium: 133 mEq/L — ABNORMAL LOW (ref 135–145)
Total Protein: 6.8 g/dL (ref 6.0–8.3)

## 2010-05-18 LAB — HEMOGLOBIN A1C
Hgb A1c MFr Bld: 6.8 % — ABNORMAL HIGH (ref 4.6–6.1)
Mean Plasma Glucose: 148 mg/dL

## 2010-05-18 LAB — ABO/RH: ABO/RH(D): O NEG

## 2010-05-18 LAB — PROTIME-INR: INR: 1 (ref 0.00–1.49)

## 2010-05-18 LAB — TYPE AND SCREEN
ABO/RH(D): O NEG
Antibody Screen: NEGATIVE

## 2010-05-18 LAB — APTT: aPTT: 30 seconds (ref 24–37)

## 2010-05-18 LAB — URINE MICROSCOPIC-ADD ON

## 2010-05-18 LAB — LIPASE, BLOOD: Lipase: 13 U/L (ref 11–59)

## 2010-06-15 NOTE — Consult Note (Signed)
NAME:  [name] [name], [name] [name]            ACCOUNT NO.:  [id number]   MEDICAL RECORD NO.:  [id number]          PATIENT TYPE:  INP   LOCATION:  3106                         FACILITY:  [facility]   PHYSICIAN:  Levert Feinstein, MD          DATE OF BIRTH:  Oct 17, 1931   DATE OF CONSULTATION:  DATE OF DISCHARGE:                                 CONSULTATION   CHIEF COMPLAINT:  Seizure.   HISTORY OF PRESENT ILLNESS:  The patient is a 77 year old male and was  admitted on March 26, 2008 to neurosurgeon, Dr. Earl Gala service,  for increased confusion, vomiting.  Apparently the patient had fell two  days prior to admission on March 24, 2008.  He had a history of  coronary artery disease, had suffered acute myocardial infarction on  August 2009, underwent angioplasty, stenting, placed on Effient 10 mg  every day.  It was antiplatelet agent, inhibit platelet activation and  aggravation through irreversible inhibition of platelets and adenosine  diphosphate (ADP receptor).   Upon admission, CAT scan has demonstrated right subdural hematoma, the  patient was under close monitoring in the following days, there  developed some mass effect, but relatively stable.  However, on April 03, 2008, the patient had waxing and waning mental status, underwent right  frontotemporal parietal craniotomy for subdural hematoma evacuation.  The patient apparently worsened on the following days, repeat CAT scan  has showed recurrent of subdural hematoma, underwent a second surgery on  April 05, 2008.   The patient has been remained intubated since first surgery April 03, 2008 and was noticed to have left facial, arm jerking, seizure activity  last about couple minutes.  Most recent episode was early this morning,  remain to the left face and arm, short lasting.   REVIEW OF SYSTEMS:  nonobtainable.   PAST MEDICAL HISTORY:  1. Coronary artery disease.  2. Hypertension.  3. Diabetes.   PAST SURGICAL HISTORY:  Bilateral  AKA, knee replacement, cataract  surgery, and right shoulder surgery.   ALLERGIES:  No known drug allergies.   CURRENT MEDICATIONS:  1. Keppra 500 mg q.8 h.  2. Norvasc.  3. Unasyn.  4. Klonopin.  5. Amaryl.  6. Lantus.  7. Metoprolol.  8. Protonix.  9. Zocor.   PHYSICAL EXAMINATION:  VITAL SIGNS:  Temperature 98.7, heart rate of 75,  blood pressure is 143/66.  CARDIAC:  Regular rate and rhythm.  PULMONARY:  Clear to auscultation.  NECK:  Supple.  No carotid bruits.  NEUROLOGIC:  He is intubated, in PRVC mode, on Diprivan, but stopped 2  minutes prior to current examination and he move his right arm and leg  spontaneously.  Grip with his right hand by commands.  Pupils were  irregular.  Doll's eye were present.  Bilateral corneal reflex were  present, breathing over the vent and has spontaneous right upper and  lower extremity movement, but dense left hemiparesis involving left  upper and lower extremity, deep tendon reflex hypoactive and symmetric.  Plantar responses were flexor.  CT of the brain without contrast  reviewed, most recent being on  April 06, 2008, status post evacuation of  the right subdural hematoma, there extra-axial pneumocephalus, and there  are minimum mass effect.   ASSESSMENT AND PLAN:  A 77 year old with partial seizure in the setting  of right subdural hematoma, increase Keppra dose to 1000 mg IV b.i.d.  and EEG.      Levert Feinstein, MD  Electronically Signed     YY/MEDQ  D:  04/07/2008  T:  04/08/2008  Job:  [id number]

## 2010-06-15 NOTE — Discharge Summary (Signed)
NAME:  [name] [name], [name] [name]            ACCOUNT NO.:  [id number]   MEDICAL RECORD NO.:  [id number]          PATIENT TYPE:  INP   LOCATION:  3003                         FACILITY:  [facility]   PHYSICIAN:  Hewitt Shorts, M.D.DATE OF BIRTH:  1931-04-27   DATE OF ADMISSION:  03/26/2008  DATE OF DISCHARGE:  04/14/2008                               DISCHARGE SUMMARY   ADMISSION HISTORY AND PHYSICAL EXAMINATION:  The patient is a 76-year-  old man who is transferred from the [facility] Emergency Room  with a diagnosis of an acute right hemispheric subdural hematoma.  History was notable for having increasing difficulties for several weeks  with subtle mental status changes for 2-3 weeks with some confusion,  beginning on March 23, 2008, and then on March 24, 2008, he fell  between his bed and the bedside table.  He noted some continued  confusion.  On the day of admission, he had some vomiting and he was  brought to the emergency room by EMS.  Dr. Effie Shy obtained the CT of the  brain, which showed a large right hemispheric acute subdural hematoma.  He was, therefore, transferred.   PAST MEDICAL HISTORY:  History is notable for being on an antiplatelet  aggregation medication called Effient, which causes permanent  irreversible platelet dysfunction.  History was notable for myocardial  infarction in August 2009.  He underwent angioplasty and stenting.  He  was placed on the Effient.  He also has history of diabetes and  hypertension.  Primary care physician is Dr. Geoffry Paradise.   PREVIOUS SURGERIES:  Bilateral knee replacements, penile implant, and  right shoulder surgeries and numerous surgeries in the left eye for  retinal detachment and further retinal problems.  He had a left eye  cataract removed as well.   ALLERGIES:  He had no allergies to medications.   MEDICATIONS:  At the time of admission include Effient, metformin,  simvastatin, glimepiride, clonazepam, aspirin,  metoprolol, and Norvasc.   PHYSICAL EXAMINATION:  GENERAL:  A well-developed, well-nourished man.  VITAL SIGNS:  Stable.  CARDIOPULMONARY:  Unremarkable.  ABDOMINAL:  Some ecchymosis over the left flank.  NEUROLOGIC:  He was oriented to his name, hospital in February 2010.  He  follows commands.  Speech was full and he had good comprehension.  Motor  exam showed 5/5 strength throughout the upper and lower extremities.  No  drift to the upper extremities.   HOSPITAL COURSE:  The patient was admitted.  The Effient was stopped.  His neurologic condition was felt to be stable and therefore we felt it  best to allow him to have an opportunity to produce functional platelets  and therefore he was closely monitored in the Intensive Care Unit.  Medical consultation was obtained from Dr. Geoffry Paradise who followed  the patient throughout the hospitalization.  Followup CT scan of the  brain showed the subdural hematoma was stable with mild midline shift.  Physical therapy and occupational therapy were consulted.  CT of the  brain was again obtained and again the subdural remained stable.  The  patient did have some hyponatremia that  resolved.  Dr. Jacky Kindle assisted  in the management of his diabetes and hypertension throughout the  hospitalization.  Despite, a reported history of extensive alcohol  consumption for years prior to hospitalization, the patient had no  withdrawal symptoms throughout the hospitalization.  He was continued on  his clonazepam now and after about 8 days, it was planned to take the  patient to surgery for evacuation of the subdural hematoma.  He became  less cooperative, not following commands but moving all 4 extremities.  He underwent a right frontotemporal-parietal craniectomy and evacuation  of the subdural hematoma.  A good evacuation was achieved.  The brain  was pulsating well.  There was no oozing and no evidence of platelet  dysfunction.  Following day, the  patient was opening his eyes, following  commands.  Dr. Lovell Sheehan, Dr. Franky Macho, and Dr. Jeral Fruit follow the patient  over a week and a half period while he is out of town.  Dr. Lovell Sheehan  obtained a CT for followup, which showed mild mass effect and shift.  The patient apparently, according to Dr. Lovell Sheehan, had some seizures and  he felt best to go ahead and have the patient return to the operating  room.  On the second postoperative day for evacuation of recurrent right  subdural hematoma, he did give the patient platelets at the time of that  surgery and on subsequent CT, he felt looked improved.  Critical care  medicine consultation was obtained subsequently in assist with the  patient's care in the ICU along with Dr. Lanell Matar assistance and Dr.  Terrace Arabia from Neurology was consulted regarding his seizure management.  He  had been started on Keppra 500 mg b.i.d. prophylactically.  He did have  some further seizures and Dr. Terrace Arabia increased the dose to 1000 mg b.i.d.  The patient has made steady progress.  Physical therapy and occupational  therapy were reconsulted.  Wound is healed nicely.  At this point, he is  walking a limited distance with PT.  He will follow commands.  Speech  therapy also has seen him and is continuing to follow the patient.  At  this time, the physical medicine rehabilitation service was consulted  regarding comprehensive inpatient rehabilitation.  The patient was felt  to be a good candidate for comprehensive inpatient rehabilitation and is  being transferred to the Rehabilitation Service.  We have requested to  follow up CT of the head.  He is awake, fully oriented, following  commands.  No drift in the upper extremities.  He had no further seizure  activities.  Blood sugars were well controlled with a morning Accu-Chek  of 139.   DISCHARGE DIAGNOSES:  1. Acute subdural hematoma contributed to by treatment with an      antiplatelet aggregation drug (Effient).  2. Seizures  now controlled with Keppra 1000 mg b.i.d., being followed      by Dr. Terrace Arabia from Neurology.  3. Diabetes and hypertension.  Continued to be managed and treated by      patient's primary care physician, Dr. Geoffry Paradise.      Hewitt Shorts, M.D.  Electronically Signed     RWN/MEDQ  D:  04/14/2008  T:  04/15/2008  Job:  [id number]   cc:   Geoffry Paradise, M.D.

## 2010-06-15 NOTE — Assessment & Plan Note (Signed)
[facility]                         GASTROENTEROLOGY OFFICE NOTE   NAME:[name] [name], [name] [name]                   MRN:          [id number]  DATE:05/29/2006                            DOB:          1931-03-30    [name] [name] is referred by Dr. Jacky Kindle because of recent findings of anemia.  He does have Dr. Lanell Matar notes, which are very helpful.  Unfortunately, he is a work-in and I do not have at the time of his  appointment his chart.  His last colonoscopic examination, I do see now  that the chart is available at the time of dictation, reveals some mild  resolved diverticulitis and mild to moderate diverticular disease,  stenosis and some __________ in the mid sigmoid colon.  The largest  polyp that we did see in his colon was approximately 0.5 cm, and it was  removed.  There were two smaller polyps, approximately 2-3 mm in size,  and these were also cauterized to destruction.  All these polyps were  noted in the rectosigmoid colon.  [name] [name] has had evidence of  diverticulitis in the past.  In 1997 he also had a colonoscopic  examination, which revealed some mild diverticulitis.  In 1989 when I  saw him, he had an upper endoscopic examination revealing a 3 cm hiatal  hernia with some mild stenosis secondary to stricture.  He was dilated  at that time.  There were no other pathologic findings in the upper  gastrointestinal tract.  He has been on medication for his reflux over  the years.  He said he has been taking Prilosec over-the-counter 2 a day  for years.  He also had been on some Propulsid but for whatever reasons  I had not seen [name] since he was in here on December 01, 1997.   Dictation ended at this point.     Ulyess Mort, MD  Electronically Signed    SML/MedQ  DD: 05/29/2006  DT: 05/30/2006  Job #: [id number]

## 2010-06-15 NOTE — Discharge Summary (Signed)
NAMESALAM, MICUCCI            ACCOUNT NO.:  [id number]   MEDICAL RECORD NO.:  [id number]          PATIENT TYPE:  INP   LOCATION:  2040                         FACILITY:  [facility]   PHYSICIAN:  Nanetta Batty, M.D.   DATE OF BIRTH:  November 07, 1931   DATE OF ADMISSION:  09/27/2007  DATE OF DISCHARGE:  10/01/2007                               DISCHARGE SUMMARY   DISCHARGE DIAGNOSES:  1. ST elevation myocardial infarction with urgent catheterization      revealing total left anterior descending artery 100%.  He had a      residual side branch of his circumflex 80%.  He had 80%-90% in      diagonal 1, 30% proximal left anterior descending artery.  His      ejection fraction was 50%.  He underwent catheterization by Dr.      Allyson Sabal.  He was subsequently put in a Promus DES stent 2.75 x 23 mm.  2. Insulin-dependent diabetes mellitus.  3. Hypertension.  4. Dyslipidemia.  5. Postprocedure confusion, questionable secondary to alcohol      withdrawal.  6. History of erectile dysfunction with penile implant.  7. Paroxysmal atrial fibrillation with transient atrial fibrillation      post myocardial infarction.   LABS:  Hemoglobin 11.8, hematocrit 34.7, platelets 289.  Sodium 136,  potassium 4.9, chloride 103, CO2 25, BUN 11, creatinine 1.29, glucose  173, INR is 1.1, AST was 80, ALT was 21, alkaline phosphatase 24 on  September 27, 2007.   CK-MB:  1. 162/12.6 with troponin of 0.52.  2. 950/128, troponin of 25.39.  3. 1023/97.3, troponin of 40.85.  4. 844/64, troponin of 18.72.   Total cholesterol is 135, LDL 74, HDL 32, and triglycerides 161.  TSH  was 3.425, glycosylated  hemoglobin was 7.3.  His last EKG showed  evolving anterior wall MI with T-wave depression  in V1 through V5.  Chest x-ray showed no active disease on November 26, 2004.   HOSPITAL COURSE:  Mr. Laws is 77 year old who presented to [facility] ER by private vehicle secondary to chest pain.  He apparently had 3  distinct  episodes of chest pain over the last 3 weeks.  On the day of  admission, he developed chest pain with rest and at sleep.  10/10 at its  worst.  He had associated nausea with it.  He took a narcotic at home  and went to the emergency room.  His troponin 1 was elevated 0.17.  We  were called and he was brought to [facility] for cardiac  catheterization.  He did have some minor ST elevation on his second EKG.  He went on to have a cath.  He had a 100% LAD.  He also had a 80%-90%  diagonal 1, 30% proximal LAD, 80% of a side branch of the circumflex,  and his EF was 50%.  Dr. Nanetta Batty then placed a Promus stent 2.75  x 23 mm.  Post procedure, the patient did well.  He had a few episodes  of atrial fib, but was mainly in sinus rhythm.  He was  seen by cardiac  rehab.  He was elevated.  He got a little confused and Ativan was  started.  He apparently drinks alcohol on a daily basis.  It was thought  possibly due to alcohol withdrawal or possibly to ICU psychosis.  He was  seen by cardiac rehab and by October 01, 2007, he was able to walk with  minimal assistance in the hall and he was seen by Dr. Allyson Sabal, considered  stable for discharge home.   DISCHARGE MEDICATIONS:  1. Metformin 1000 mg twice a day.  2. Cymbalta 60 mg a day.  3. Glyburide 2 mg a day.  4. Norvasc 5 mg at night.  5. Clonazepam 0.5 to 1 mg at night.  6. Toprol-XL 25 mg a day.  7. Vitamin B6 daily.  8. Zocor 40 mg a day.  9. Aspirin, he is asked to take two 81 mg tablets a day.  10.Tricor 145 mg a day.  11.Plavix 75 mg a day, he was told not to stop.  12.Lisinopril 5 mg a day.  13.Pepcid 40 mg a day.  14.Nitroglycerin as he can use p.r.n.  15.Lantus insulin 40 units daily.  16.Darvocet as needed for his pain that he was on previously.   He also has a followup appointment with Dr. Allyson Sabal per Dr. Hazle Coca  request on the October 09, 2007, at 12 noon.  He was told not to do any  strenuous activity, lifting,  pushing, or driving until he is seen by Dr.  Allyson Sabal.      Lezlie Octave, N.P.      Nanetta Batty, M.D.  Electronically Signed    BB/MEDQ  D:  10/01/2007  T:  10/02/2007  Job:  [id number]   cc:   Geoffry Paradise, M.D.

## 2010-06-15 NOTE — Discharge Summary (Signed)
NAME:  [name] [name], [name] [name]            ACCOUNT NO.:  [id number]   MEDICAL RECORD NO.:  [id number]          PATIENT TYPE:  IPS   LOCATION:  4003                         FACILITY:  [facility]   PHYSICIAN:  Ranelle Oyster, M.D.DATE OF BIRTH:  Nov 08, 1931   DATE OF ADMISSION:  04/14/2008  DATE OF DISCHARGE:  04/30/2008                               DISCHARGE SUMMARY   DISCHARGE DIAGNOSES:  1. Recurrent right subdural hematoma.  2. Seizure disorder.  3. Hypertension.  4. Diabetes mellitus.  5. Hyperlipidemia.  6. History of alcohol abuse.  7. Gastroesophageal reflux disease.  8. Coronary artery disease with percutaneous transluminal coronary      angioplasty.  9. Bilateral total knee replacement.   PROCEDURES:  1. Status post right frontal temporal parietal craniotomy evacuation      of hematoma on April 03, 2008.  2. Status post recurrent subdural hematoma, status post redo      craniotomy with JP drain on April 05, 2008.   This is a 77 year old white male, history of coronary artery disease  with PTCA, on Effient for blood thinner per Cardiology Services.  He was  admitted on March 26, 2008, with altered mental status, decreased  gait, repeated fall on March 24, 2008, with nausea and vomiting.  Cranial CT scan showed a large right acute subdural hematoma,  preoperative platelet transfusion for reversal while on Effient.  Underwent right frontal temporal parietal craniotomy evacuation of  hematoma on April 03, 2008, repeat craniotomy on April 05, 2008, and  recurrent right subdural hematoma and placement of JP drain.  He  remained intubated, noted seizure on April 07, 2008.  Neurology consulted  Dr. Terrace Arabia, placed on Keppra, EEG negative, extubated on April 10, 2008,  with slow progress, noted bouts of confusion and altered mental status.  Followup cranial CT scan without contrast was unchanged.  Monitoring of  safety with limited awareness.  The patient was seen by Inpatient Rehab  Services, felt to be a good candidate for inpatient comprehensive rehab  program.   PAST MEDICAL HISTORY:  1. Coronary artery disease with PTCA.  2. Non-insulin-dependent diabetes mellitus.  3. Hyperlipidemia.  4. Hypertension.  5. Right shoulder surgery.  6. Retinal detachment repair.  7. 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  8. He does have a history of alcohol use, questionable amount.  9. Negative tobacco.   ALLERGIES:  None.   SOCIAL HISTORY:  Lives with his wife and son.  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   FUNCTIONAL HISTORY PRIOR TO ADMISSION:  Independent.   FUNCTIONAL STATUS UPON ADMISSION TO REHAB SERVICES:  Moderate assist 15  feet with a rolling walker, total assist secondary to posterior lean, +2  total assist for transfers, +2 max assist bed mobility with cues.   MEDICATIONS PRIOR TO ADMISSION:  1. Prilosec 20 mg daily.  2. Vitamin D daily.  3. Zocor 40 mg daily.  4. Amaryl 2 mg daily.  5. Clonazepam 0.5 mg 3 times daily.  6. Aspirin 81 mg daily.  7. Glucophage 1000 mg twice daily.  8. Norvasc 5 mg daily.  9. Effient 10 mg daily.  10.Metoprolol 12.5 mg twice daily.   PHYSICAL EXAMINATION:  VITAL SIGNS:  Blood pressure 156/82, pulse 88,  temperature 98.3, and respirations 18.  NEUROLOGIC:  This was an alert male, somewhat impulsive, follow two-step  commands, flat affect.  Needs some encouragement to participate with  exam, names person, place, and year.  Needed cues for situation and  recall.  Deep tendon reflexes 2+.  Calves remain cool without any  swelling, erythema, nontender.  Craniotomy site was healing nicely.  LUNGS:  Clear to auscultation.  CARDIAC:  Regular rate and rhythm.  ABDOMEN: Soft and nontender.  Good bowel sounds.   REHABILITATION HOSPITAL COURSE:  The patient was admitted to Inpatient  Rehab Services with therapies initiated on a 75-hour daily basis  consisting of physical therapy, occupational  therapy, speech therapy,  and rehabilitation nursing.  The following issues were addressed during  the patient's rehabilitation stay.  Pertaining to [name] [name]'  recurrent right subdural hematoma, he had undergone right frontal  temporal parietal craniotomy evacuation of hematoma on April 03, 2008,  with recurrent subdural hematoma with repeat craniotomy, JP drain on  April 05, 2008.  He would follow up with Neurosurgery, Dr. Newell Coral.  During his hospital course, he had been placed on Keppra for seizure  disorder.  He remained on 1000 mg twice daily.  Noting altered mental  status related to his subdural hematoma, also noted history of alcohol  abuse and monitored for any withdrawal symptoms.  A followup cranial CT  scan on April 14, 2008, showed stable appearance of the head without  acute findings.  He initially needed restraints for safety.  He was  somewhat reluctant to participate at times with his therapies, but this  did improve with time.  He was placed on trazodone 50 mg at bedtime to  help aid in his sleep again with good results, Klonopin increased to 0.5  mg twice daily on April 22, 2008, as well as Lexapro being added 5 mg at  bedtime with Klonopin changed to 1 mg twice daily on April 24, 2008,  which did help in his overall attention and being able to rest.  He was  placed in a low boy bed for his safety.  His restraints were  discontinued.  He was followed by Dr. Eula Flax from  Neuropsychology, who notes on latest followup April 28, 2008, the  patient appears more alert, no apparent distress, response when spoken  to, speaks slowly with some word-finding difficulties.  His affect  appeared mildly blunted and range.  He was alert and oriented x3.  He  denied any emotional distress.  He continues to display some underlying  depressive reactions marked by some withdrawal.  He still had some poor  insight into his overall disability.  His blood pressures remained well   controlled throughout his course on Norvasc 5 mg daily.  Blood sugars  overall with Amaryl, Glucophage, and Lantus insulin of 8 units twice  daily with latest blood sugars of 137, 144, and 201 as he remained on a  diabetic diet.  He would remain on his Zocor for history of  hyperlipidemia.  Noted the patient did have a penile prosthesis that had  not worked properly in approximately 1-1/2 years, it remained inflated  with no discomfort noted to the patient.  The patient refused any care  for this prosthesis.   Weekly collaborative interdisciplinary team conferences were held to  discuss the patient's estimated length  of stay, any barriers to  discharge.  He was minimal assist to close supervision for mobility,  minimal assist for activities of daily living, again poor safety  awareness with his judgment with a low bed in place for his safety.  Routine toileting was provided.   LATEST LABORATORIES:  Sodium 137, potassium 4.3, BUN 17, and creatinine  1.14.  Hemoglobin 11, hematocrit 33.1, and platelet 307,000.  Due to  limited assistance at home, it was felt skilled nursing facility would  be needed with bed becoming available on April 30, 2008, and discharge  taking place at that time.   DISCHARGE MEDICATIONS:  1. Glucophage 250 mg twice daily.  2. Norvasc 5 mg p.o. daily.  3. Amaryl 2 mg p.o. daily.  4. Keppra 1000 mg p.o. every 12 hours.  5. Lopressor 12.5 mg twice daily.  6. Protonix 40 mg p.o. daily.  7. Zocor 40 mg p.o. daily.  8. Desyrel 50 mg p.o. at bedtime.  9. Lantus insulin 8 units subcutaneously twice daily.  10.Lexapro 5 mg p.o. nightly.  11.Klonopin 1 mg p.o. b.i.d.  12.Tylenol 650 mg p.o. every 4 hours as needed for pain.   DIET:  Diabetic diet.   SPECIAL INSTRUCTIONS:  Continue therapies to promote overall mobility  and well-being.  The patient should follow up with Dr. Newell Coral,  Neurosurgery and Dr. Jacky Kindle, Medical Management.      Mariam Dollar,  P.A.      Ranelle Oyster, M.D.  Electronically Signed    DA/MEDQ  D:  04/29/2008  T:  04/30/2008  Job:  [id number]   cc:   Hewitt Shorts, M.D.  Nanetta Batty, M.D.  Geoffry Paradise, M.D.  Levert Feinstein, MD

## 2010-06-15 NOTE — Op Note (Signed)
NAME:  [name] [name], STAFFA NO.:  [id number]   MEDICAL RECORD NO.:  [id number]          PATIENT TYPE:  INP   LOCATION:  3106                         FACILITY:  [facility]   PHYSICIAN:  Cristi Loron, M.D.DATE OF BIRTH:  1931-03-25   DATE OF PROCEDURE:  04/05/2008  DATE OF DISCHARGE:                               OPERATIVE REPORT   BRIEF HISTORY:  The patient is a 77 year old white male who presented  with a right subdural hematoma.  The patient is on antiplatelet therapy  secondary to coronary artery disease.  Dr. Newell Coral performed a right  craniotomy on him 2 days ago for evacuation of the subdural hematoma.  The patient has worsened and has had multiple sutures.  I worked him up  with a followup cranial CT scan which demonstrated the patient had a  recurrent subdural hematoma.  I discussed the situation with the  patient's wife and I recommended he undergo a repeat craniotomy to  evacuate his subdural hematoma.  I explained the procedure as well as  the risk.  I have answered all her questions.  She has consented on  behalf of the patient.   PREOPERATIVE DIAGNOSIS:  Recurrent right subdural hematoma.   POSTOPERATIVE DIAGNOSIS:  Recurrent right subdural hematoma.   PROCEDURE:  Right frontotemporal parietal craniotomy for evacuation of  subdural hematoma and placement of subdural Jackson-Pratt drain.   SURGEON:  Cristi Loron, MD   ASSISTANT:  None.   ANESTHESIA:  General endotracheal.   ESTIMATED BLOOD LOSS:  75 mL.   SPECIMENS:  None.   DRAINS:  One subdural Jackson-Pratt drain.   COMPLICATIONS:  None.   DESCRIPTION OF PROCEDURE:  The patient was brought to the operating room  by the Anesthesia Team.  General endotracheal anesthesia was induced.  The patient remained in the supine position.  A roll was placed over his  right shoulder.  His calvarium was supported in the Mayfield three-point  headrest.  His head was turned to the left exposing his  right scalp.  The staples were then removed from his previous incision.  His right  scalp was then prepared with Betadine scrub and Betadine solution.  Sterile drapes were applied.  I then used a scalpel to incise through  the patient's fresh surgical scar/wound.  I then exposed the underlying  scalp and there was a subgaleal hematoma.  I then used the cerebellar  retractors to expose the previous craniotomy flap.  We used the  screwdriver to remove the plate and screws, then elevated the craniotomy  flap.  There was a small epidural hematoma which were removed with  suction irrigation.  I then used Metzenbaum scissors to cut the sutures  in the reapproximated patient's dura.  We encountered a large subdural  hematoma.  I removed a large diffuse subdural hematoma.  We removed it  with suction and irrigation.  We did not find any active bleeding  points.  After we had removed all the subdural hematoma, we irrigated  the wound out copiously with bacitracin saline solution and thoroughly  irrigated, it came back crystal clear.  We placed the  Avitene powder in  the subdural space.  I let it sit for a while and rinsed it out.  Again,  the irrigant came back crystal clear.  We then placed a 10-mm flat  Jackson-Pratt drain in the subdural space and tunneled out through a  separate stab wound.  We then reapproximated the patient's dura with  interrupted 4-0 Nurolon suture.  We placed a large piece of Gelfoam over  the exposed dura and then reapproximated the patient's craniotomy flap  with titanium miniplates and screws.  We then reapproximated the  patient's galea with interrupted 2-0 Vicryl suture and the skin with  stainless steel staples.  We then sutured in the drain.  We coated the  wound with bacitracin ointment and then applied a sterile dressing.  The  drapes were removed, and the Mayfield three-point headrest was removed  from the patient's calvarium.  The patient was subsequently  transported  to the ICU in critical condition on a ventilator.  All sponge,  instrument, and needle counts were correct at the end of this case.       Cristi Loron, M.D.  Electronically Signed     JDJ/MEDQ  D:  04/05/2008  T:  04/06/2008  Job:  [id number]

## 2010-06-15 NOTE — Cardiovascular Report (Signed)
NAME:  [name] [name], [name] [name] NO.:  [id number]   MEDICAL RECORD NO.:  [id number]          PATIENT TYPE:  INP   LOCATION:  2902                         FACILITY:  [facility]   PHYSICIAN:  Nanetta Batty, M.D.   DATE OF BIRTH:  1931-06-04   DATE OF PROCEDURE:  09/27/2007  DATE OF DISCHARGE:                            CARDIAC CATHETERIZATION   [name] [name] is a 77 year old married white male patient of Dr.  Alanda Amass and Dr. Jacky Kindle.  He has a history of hypertension, insulin-  dependent diabetes, tobacco abuse and GERD.  He was cathed in the OR and  found to have noncritical CAD.  In the last few weeks, he has had three  episodes of chest pain, the worst pain last night which awoke him from  sleep.  He presented to [facility] ER where he was found to have  positive enzymes.  He was placed on IV nitro, but heparin was not  started.  His EKGs apparently were reported as normal, however, upon  transfer and arrival at [facility], further review of the EKGs showed  ST-segment elevation and he was called a STEMI.  He was brought  urgently into the Cath Lab for cath and possible intervention.   DESCRIPTION OF PROCEDURE:  The patient was brought to the Second Floor  [facility] Cardiac Cath Lab urgently in a postabsorptive state.  He was  premedicated with IV Versed and fentanyl.  His right groin was prepped  and shaved in the usual sterile fashion.  Xylocaine 1% was used for  local anesthesia.  A 6-French sheath was inserted into the right femoral  artery using standard Seldinger technique.  A 6-French right and left  Judkins diagnostic catheter as well a 6-French pigtail catheter were  used for selective coronary angiography, left ventriculography,  subselective left internal mammary artery angiography, and distal  abdominal aortography.  Visipaque dye was used for the entirety of the  case.  Retrograde aortic, ventricular and pullback pressures were  recorded.   HEMODYNAMICS:  1. Aortic systolic pressure 137, diastolic pressure is 76.  2. Left ventricular systolic pressure 136, end-diastolic pressure 18.   SELECTIVE CORONARY ANGIOGRAPHY:  1. Left main normal.  2. LAD; LAD was occluded after the small first diagonal branch which      had a high-grade ostial lesion.  3. Left circumflex; large vessel with 80% stenosis in the distal AV      groove circumflex unchanged by report from a prior cath 8 years      ago.  4. Right coronary artery; dominant and free of significant disease.      There were no right-to-left collaterals noted.  5. Left internal mammary artery; this vessel was subselectively      visualized and was widely patent.  This was suitable for use during      coronary artery bypass grafting if required.  6. Left ventriculography; RAO left ventriculogram was performed using      25 mL of Visipaque dye at 12 mL per second.  The overall LVEF was      estimated at approximately 50% with mild anteroapical  hypokinesia.  7. Distal abdominal aortography; distal abdominal aortogram was      performed using 20 mL of Visipaque dye at 20 mL per second.  The      renal arteries were widely patent.  The infrarenal abdominal aorta      and iliac bifurcation appeared free of significant atherosclerotic      changes.   IMPRESSION:  [name] [name] has occluded left anterior descending after  the first small diagonal branch.  We will proceed with percutaneous  coronary intervention and stenting using Angiomax and drug-eluting  stent.   DESCRIPTION OF PROCEDURE:  Using a 6-French XB 3.5 LAD guide catheter  along with 4190 [employer]ahi soft wire and a 2.0 x 12 Sprinter angioplasty was  performed.  The patient had received 325 mg of aspirin at [facility], and he received 600 mg of p.o. Plavix as well as 20 mg of IV  Pepcid.  He received Angiomax bolus with an ACT of 328.   The wire crossed the lesion which was best seen in the spider view.  The lesion was dilated  with a 2.0 Sprinter establishing antegrade flow  with a door-to-balloon time of 49 minutes.  Following this, a 2.75 x 23  PROMUS drug-eluting stent was then deployed at 12-14 atmospheres  (approximately 3 mm) with excellent TIMI III antegrade flow.  The diag  was spared but jailed with residual ostial disease.  Attempts were made  to post dilate with both an [place] Sprinter and Brenham Voyager, however, because  the lesion was on a bend and because of the wire bias, the post-  dilatation balloons were not able to cross the stented segment.  However, there appeared to be good apposition angiographically.   OVERALL IMPRESSION:  Successful direct percutaneous coronary  intervention and stenting of an occluded left anterior descending in the  setting of a STEMI  with a PROMUS drug-eluting stent, and with a door-  to-balloon time of 49 minutes.  The patient tolerated the procedure  well.  There were no hemodynamic or electrocardiographic sequelae.  He  will be treated with aspirin and Plavix, beta-blocker, statin and ACE  inhibitor.  The guidewire and catheter removed.  The sheath was then  secured in place.  The patient left the lab in stable condition.  Sheaths will be removed in approximately 2 hours.  His enzymes will be  cycled and he will be discharged home in approximately 2-3 days if he  remains clinically stable.      Nanetta Batty, M.D.  Electronically Signed     JB/MEDQ  D:  09/27/2007  T:  09/28/2007  Job:  [id number]   cc:   Second [facility]  [facility]  Richard A. Alanda Amass, M.D.  Geoffry Paradise, M.D.

## 2010-06-15 NOTE — Op Note (Signed)
NAME:  [name] [name], [name] [name]            ACCOUNT NO.:  [id number]   MEDICAL RECORD NO.:  [id number]           PATIENT TYPE:   LOCATION:                                 FACILITY:   PHYSICIAN:  Hewitt Shorts, M.D.DATE OF BIRTH:  [birth year]   DATE OF PROCEDURE:  04/03/2008  DATE OF DISCHARGE:                               OPERATIVE REPORT   PREOPERATIVE DIAGNOSES:  Subacute right hemispheric subdural hematoma  and altered mental status.   POSTOPERATIVE DIAGNOSES:  Subacute right hemispheric subdural hematoma  and altered mental status.   PROCEDURES:  1. Right frontotemporal parietal craniotomy.  2. Evacuation of subdural hematoma with microdissection.   SURGEON:  Hewitt Shorts, MD   ASSISTANT:  Hilda Lias, MD   ANESTHESIA:  General endotracheal.   INDICATIONS:  The patient is a 77 year old man who had been on Effient  and irreversible antiplatelet aggregation agent, presented with  increasing difficulties over several weeks, and was found by CT scan to  have a large and acute right hemisphere subdural hematoma 8 days ago.  He was admitted to the Neurosurgical Intensive Care Unit.  The Effient  was discontinued and we monitored the subdural hematoma and his  neurologic function.  The hematoma has been stable in size and mass  effect, and his mental status has waxed and waned.  It was felt at this  time feeling to go ahead with craniotomy for evacuation of the subdural  hematoma.   PROCEDURE:  The patient was brought to the operating room, and placed  under general anesthesia.  The patient was placed on three pronged  Mayfield head holder.  A roll was placed beneath the right shoulder, and  the patient's head and neck were turned gently to the left.  The scalp  was shaved, prepped with Betadine soap and solution, and draped in a  sterile fashion.  A right parasagittal incision was made over the  frontoparietal boss.  The line of incision was infiltrated with local  anesthetic with epinephrine, and remaining cups were applied to the  scalp just to maintain hemostasis.  Self-retaining retractor was placed  and two-burr holes were made.  We dissected the dura from the overlying  skull, and then a craniotome attachment was used to turn a bone flap and  the bone flap was set aside to be later returned in place.   The dura was opened in a U-shaped fashion, hinged towards the midline,  and the subacute subdural hematoma was immediately identified.  We began  to gently irrigate the hematoma off the brain surface with warm saline  irrigation, and we were able to evacuate the great majority of the  hematoma.  Some of the most distant portions of the hematoma from the  exposure were left given that the benefit for removing that was less, it  was outweighed by the potential risk of creating bleeding.  In the end,  we irrigated the subdural space extensively with warm saline until  clear, and good decompression of the right hemisphere was achieved.  The  brain was pulsating nicely throughout the procedure.  We then  approximated the dura with interrupted 4-0 Nurolon suture.  The dura was  tacked up to the margins of the craniotomy as well as to the bone flap  itself with 4-0 Nurolon suture, and the bone flap was secured to the  skull with four straight Lorenz cranial plates and 4 mm screws.   Once the bone flap was in place, we proceeded with closure of the scalp.  The galea was closed with interrupted and inverted 2-0 undyed Vicryl  sutures, skin edges were closed with surgical staples, and the wound was  dressed with Adaptic and sterile gauze and having retracted with 2  Kerlix and the Kling.  The procedure was tolerated well, and the  estimated blood loss was 50 mL.  Sponge and needle count correct.  Following surgery, the patient was taken out of the three pronged  Mayfield Head holder to be reversed from the anesthetic, extubated, and  transferred to the  recovery room for further care.      Hewitt Shorts, M.D.  Electronically Signed     RWN/MEDQ  D:  04/03/2008  T:  04/04/2008  Job:  [id number]

## 2010-06-15 NOTE — Assessment & Plan Note (Signed)
[facility]                         GASTROENTEROLOGY OFFICE NOTE   NAME:[name], [name] SCHLOSSER                   MRN:          [id number]  DATE:06/16/2006                            DOB:          12-25-1931    [name] [name] comes back in for followup of his anemia.  Unfortunately, for  unknown reasons, the dictation I did on him when he was here before only  was partially obtained.  It indicates the dictation ended a third of the  way through the comments.  I am not quite sure what it was and I will do  the best with it.  He was referred by Dr. Jacky Kindle because of findings of  anemia.  He had excellent notes from Dr. Jacky Kindle and he was worked in  and I did not have his chart at the time.  He has had some mild  diverticulosis in the past with moderately severe diverticular disease  and stenosis of the mid-sigmoid colon.  He has had polyps of his colon.  His last procedure was done approximately ten years previously.  He did  not have a followup.  He has had an upper endoscopic examination, as  well.  This was in 1989 and revealed a 3 cm hiatal hernia with some mild  stenosis, secondary to the stricture.  He was dilated at the time.  He  has been taking Prilosec over-the-counter.  He says that, as long as he  takes one twice a day, he does pretty well.  He says his only problem,  as far as his upper gastrointestinal tract is concerned is that he has  to clear his throat at times and I wondered about a Plummer-Vincent type  syndrome with iron deficiency and so on.   MEDICATIONS INCLUDE:  1. Metformin.  2. Prilosec.  3. Avandia.  4. Glimepiride.  5. Norvasc.  6. Clonazepam.  7. Toprol.   PHYSICAL EXAMINATION:  He is pale and I repeated it today.  His weight  was 210, blood pressure 130/70, pulse 80 and regular.  NECK:  Unremarkable.  HEART:  Unremarkable.  EXTREMITIES:  Unremarkable.  ABDOMEN:  Soft, no mass or organomegaly.  Nontender.  Inguinal area  and  supraclavicular areas negative.   IMPRESSION:  Mixed anemia with marked B12 deficiency and iron  deficiency.  We did get blood test on him, which revealed his B12 at 57,  which is markedly decreased.  It is amazing he tells me he has no  symptomatology.  He indicates that he thinks all his problems resulted  from the surgery that he had on his mouth, which did not allow him to  eat solid food of any sort for almost a year.  This certainly could be  an explanation for this anemia, but I would not assume this.  He does  have diabetes.  He takes Metformin, Avandia, glimepiride, Norvasc,  Toprol, thusly has hypertension.  He also has some anxiety depression,  history of chronic headaches and appendectomy.  He drinks alcohol  occasionally, as he indicated in his review of systems.  He has had some  shortness of breath and  voice change.  It is interesting, on review of  his chart, I saw him on January 29, 1974.  At that time, he was  describing heartburn and I diagnosed him with esophagitis.   His other medications include Cymbalta, Zocor, Toprol, Diovan, Lantus  insulin, Tricor, Avelox, baby aspirin.   My recommendation is for him to continue his B12 shots and continue on  his iron b.i.d., follow up with blood tests in the next several weeks,  checking his B12 and irons again and CBC.  Hopefully, he will have  improvement in this and then, I have certainly advised him strongly to  have a colonoscopic examination and an upper endoscopy at the same time  with his long history and so on and his anemia.  I think it is  interesting that his MCV being normal indicates a mixed picture and that  is why it could be difficult to diagnosis his B12 deficiency.     Ulyess Mort, MD  Electronically Signed    SML/MedQ  DD: 06/16/2006  DT: 06/16/2006  Job #: [id number]   cc:   Geoffry Paradise, M.D.

## 2010-06-15 NOTE — H&P (Signed)
NAME:  [name] [name], [name] [name] NO.:  [id number]   MEDICAL RECORD NO.:  [id number]          PATIENT TYPE:  EMS   LOCATION:  ED                           FACILITY:  [facility]   PHYSICIAN:  Hewitt Shorts, M.D.DATE OF BIRTH:  12/11/1931   DATE OF ADMISSION:  03/26/2008  DATE OF DISCHARGE:  03/26/2008                              HISTORY & PHYSICAL   HISTORY OF PRESENT ILLNESS:  The patient is a 77 year old white male who  was transferred from the [facility] Emergency Room  with a diagnosis of acute right hemispheric subdural hematoma.   The patient's history is notable for increasing difficulties over the  past several weeks.  His son notes that he has had some subtle mental  status changes for the past 2 to 3 weeks and his wife has noticed some  confusion beginning on Sunday, March 23, 2008.  On the morning of  Monday, March 24, 2008, the patient fell between his bed and the  bedside table and she has noticed some continued confusion over the past  2 days.  Today, he had an episode of vomiting and because of these  increasing difficulties, the patient was brought by EMS to the [facility] Emergency Room for evaluation.  The patient was  evaluated by Dr. Gray Bernhardt, the emergency room physician.  CT of the  brain without contrast was obtained and revealed a large right  hemispheric acute subdural hematoma and neurosurgical consultation was  requested.  The patient was therefore transferred by [facility] from  [facility] Emergency Room to the neurosurgical intensive care unit at  [facility].   The patient's wife and son noticed subtle changes such as forgetting  conversations and events on a short-term basis.   The patient does complain of headache, but does not note weakness or  seizures.   His history is notable for being on a powerful antiplatelet aggregation  drug that is relatively new, similar in action to  Plavix called Effient.  We have obtained a pharmacy consultation and the information regarding  this drugs indicates that it causes permanent and irreversible platelet  dysfunction and the only action that can be done to reverse its action  is platelet transfusions.   PAST MEDICAL HISTORY:  Notable for history of myocardial infarction in  August 2009.  He underwent angioplasty and stenting and then was placed  on Effient 10 mg daily.  Also a history of diabetes and hypertension.   PREVIOUS SURGERY:  1. Bilateral knee replacements.  2. Right shoulder surgeries and numerous surgeries of the left eye for      retinal detachment and further retinal problems.  He did have      cataract removed from the left eye as well.   ALLERGIES:  He has no allergies to medications.   MEDICATIONS:  1. Effient 10 mg daily.  2. Metformin 1000 mg b.i.d.  3. Prilosec 20 mg daily.  4. Vitamin D 1000 mg daily.  5. Simvastatin 40 mg daily.  6. Glimepiride 2 mg daily.  7. Clonazepam 0.5 mg t.i.d.  8. Aspirin 81 mg daily.  9. Metoprolol 25 mg daily.  10.Norvasc 5 mg daily.   FAMILY HISTORY:  Parents have passed on.  His father and his sister are  both alcoholics.   SOCIAL HISTORY:  The patient is married.  He has 2 children.  He is  retired.  He does not smoke.  He has been drinking heavily.  Previously,  he used to drink beer, now he drinks a large amount of jug wine and  Scotch.  He does not have a history of drug abuse.   REVIEW OF SYSTEMS:  Notable for those described in his history of  present illness and past medical history, was otherwise unremarkable.   PHYSICAL EXAMINATION:  GENERAL:  The patient is a well-developed, well-  nourished white male, who appears his stated age and is in no acute  distress.  VITAL SIGNS:  Temperature 97.4, pulse 80, and blood pressure 157/78.  LUNGS:  Clear to auscultation.  He has symmetric respiratory excursions.  HEART:  Regular rate and rhythm.  No S1 and  S2.  There is no murmur.  ABDOMEN:  Soft, nontender, and nondistended.  Bowel sounds are present.  He does have ecchymosis over the left flank. EXTREMITIES:  No clubbing,  cyanosis, or edema.  Vascular examination shows good pulses in the  distal upper and lower extremities.  NEUROLOGIC:  The patient is awake, he is alert.  He is oriented to his  name, hospital, and February 2010.  He follows commands.  Speech is  fluent.  He has good comprehension.  Cranial nerves show the left pupil  is irregular and status post iridectomy.  Right pupils 2-1/2 mm, round  and reactive to light.  Extraocular movements are intact.  Facial  movement is symmetrical.  Hearing is diminished bilaterally.  Shoulder  shrug was symmetrical.  Palate movement was symmetrical.  Tongue is  midline.  Motor examination shows 5/5 strength to the upper and lower  extremities.  He has no drift in the upper extremities.  Sensation is  intact to pinprick to the extremities.  Reflexes are diminished  throughout the upper and lower extremities.  Toes are downgoing.  Gait  and stance were not tested due to the nature of his condition.   IMPRESSION:  Large right hemispheric acute subdural hematoma in a  patient who is having difficulties with some confusion, but is otherwise  neurologically intact.  He has been taking an irreversible antiplatelet  aggregation agent (Effient) the effects of which will take 2 weeks to be  eliminated fully.  In fact, he took the medication this morning.   PLAN:  The patient will be admitted to neurosurgical intensive care unit  for close observation including neuro checks with a followup CT scan on  the morning of Friday, March 28, 2008, or sooner if he has increased  difficulties.  For now with him being neurologically intact, it is felt  better to allow him to begin to develop healthy functional platelets.  None of his home medications will be continued.  He does have a history  of diabetes  and a sliding scale insulin schedule will be ordered as well  as prn meds for increased blood pressure.  I have also requested  consultation from his primary physician, Dr. Geoffry Paradise for  assistance with his medical issues during this hospitalization.  I have  had an opportunity to speak with the patient's wife regarding his  condition and guarded prognosis, her questions were  answered.      Hewitt Shorts, M.D.  Electronically Signed     RWN/MEDQ  D:  03/26/2008  T:  03/27/2008  Job:  [id number]   cc:   Geoffry Paradise, M.D.  Nanetta Batty, M.D.

## 2010-06-15 NOTE — Assessment & Plan Note (Signed)
[facility]                         GASTROENTEROLOGY OFFICE NOTE   NAME:Taulbee, [name] [name]                   MRN:          [id number]  DATE:05/29/2006                            DOB:          [birth year]    REFERRING PHYSICIAN:  Geoffry Paradise, M.D.   The patient comes in and is referred by Dr. Jacky Kindle, because of recent  findings of anemia.  His blood tests were sent over with him.  He says  it is really been asymptomatic except he has been quite sleepy and tired  recently, and he constantly has to clear his throat which could be a  slight Plummer-Vinson-type syndrome, but not necessarily since it is  usually and women in usually has to do with chewing ice, but it is also  related to iron deficiency anemia.   MEDICATIONS INCLUDE:  1. Metformin for his diabetes.  2. Prilosec 20 mg daily morning and I come if he misses some he gets      recurrent symptoms.  3. Avandia.  4. Glimepiride.  5. Norvasc.  6. Clonazepam.  7. Toprol.   Dictation ended at this point.     Ulyess Mort, MD  Electronically Signed    SML/MedQ  DD: 05/29/2006  DT: 05/30/2006  Job #: [id number]

## 2010-06-15 NOTE — H&P (Signed)
NAME:  [name] [name], [name] [name]            ACCOUNT NO.:  [id number]   MEDICAL RECORD NO.:  [id number]          PATIENT TYPE:  IPS   LOCATION:  4003                         FACILITY:  [facility]   PHYSICIAN:  Ranelle Oyster, M.D.DATE OF BIRTH:  07-Jan-1932   DATE OF ADMISSION:  04/14/2008  DATE OF DISCHARGE:                              HISTORY & PHYSICAL   CHIEF COMPLAINTS:  Left-sided weakness and confusion.   HISTORY OF PRESENT ILLNESS:  This is a 77 year old white male with  history of diabetes and bilateral total knee replacements as well as  CAD, who is on Effient as blood thinner per Cardiology.  He was admitted  on March 26, 2008, with altered mental status and decreased gait with  repeated falls.  CT showed a large right acute subdural hematoma.  Preoperatively, the patient received platelets to reverse the effect of  Effient.  The patient underwent right frontotemporoparietal craniotomy  and evacuation of hematoma on April 03, 2008, with a repeat craniotomy on  April 05, 2008, for recurrent right subdural blood and placement of JP  drain by Dr. Newell Coral.  He had a seizure on April 07, 2008.  Neurology  placed the patient on Keppra.  EEG was negative.  The patient was  extubated on April 10, 2008, and has had slow functional return  subsequently.  His diet was advanced to a D3 thin.  Neurosurgery plans  to follow up a head CT at some point without contrast to assess  intracranial status.  The patient has had problems with safety and  limited awareness.  Rehab was consulted on April 11, 2008, and felt the  patient could benefit from an inpatient rehab stay.  Also the patient  was brought today after medical clearance.   REVIEW OF SYSTEMS:  Notable for weakness, palpitations, decreased  affect, and safety awareness.  Other pertinent positives are above and  full review is in the written H&P.   PAST MEDICAL HISTORY:  Positive for CAD with PTCA, insulin-requiring  diabetes,  hyperlipidemia, hypertension, right shoulder surgery, retinal  detachment repair, and penile prosthesis in 2005 which is not functional  now for 1-1/2 years.  He has a history of alcohol, but no tobacco.   FAMILY HISTORY:  Positive for CAD.   SOCIAL HISTORY:  The patient lives with his wife and son, and is  retired.  Wife can assist at discharge.  Other local family work.   ALLERGIES:  None.   HOME MEDICATIONS:  Prilosec, vitamin D, simvastatin, Amaryl, clonazepam,  aspirin, metformin, Norvasc, Effient, and metoprolol.   LABORATORY DATA:  Hemoglobin 11, white count 6.3, and platelets 419.  Sodium 121, potassium 3.2, BUN 6, and creatinine 0.7.   PHYSICAL EXAMINATION:  VITAL SIGNS:  Blood pressure is 156/82, pulse is  88, respiratory rate is 18, and temperature 98.3.  GENERAL:  The patient is generally alert, oriented to place and reason  he is here, but not to date.  Affect is flat.  HEENT:  Pupils equally round and reactive to light.  Ears, nose, and  throat exam notable for an intact mucosa and fair dentition.  NECK:  Supple without JVD or lymphadenopathy.  CHEST:  Clear to auscultation bilaterally without wheezes, rales, or  rhonchi.  HEART:  Regular rate and rhythm without murmur, rubs, or gallops.  ABDOMEN:  Soft and nontender.  Bowel sounds are positive.  SKIN:  Notable for intact cranial incision.  Otherwise, no breakdown was  seen.  EXTREMITIES:  His shoulder has a bit decreased range of motion on the  right, but generally functional.  NEUROLOGIC:  Cranial nerves II-XII revealed decreased vision to the  right with some diplopia that appears there.  Reflexes are 1+ really  throughout.  Sensation is grossly intact.  The patient has a left  pronator drift today.  Decreased fine motor coordination in the left  hand more than right side as well as left leg more than right leg.  Strength was generally 4 to 4+/5 left upper extremity and 5/5 right  upper extremity.  Left lower  extremity strength was 3/5 to 4/5 left, 3+  to 5/5 on the right.  Judgment was poor as was overall orientation as  noted above.  Memory was poor.  He had poor insight and awareness  overall and mood was very flat.   POST ADMISSION PHYSICIAN EVALUATION:  1. Functional deficit secondary to recurrent right subdural hematoma      with left hemiparesis and gait issues as well as impaired      cognition.  2. The patient is admitted to receive collaborative interdisciplinary      care between the physiatrist, rehab nursing staff, and therapy      team.  3. The patient's level of medical complexity and substantial therapy      needs in context of that medical necessity cannot be provided at a      lesser intensity care.  4. The patient has experienced substantial functional loss from his      baseline.  Prior to arrival, the patient is independent with all      aspects of care.  As of rehab consult on April 11, 2008, the      patient had not received therapy post his most recent observation.      Prior to the last operation, the patient had been at a min assist      level.  As of the last 24 hours, the patient is requiring mod      assist rolling walker for 15 feet with posterior lean and loss of      balance.  He has been plus to total assistance with the patient      performing 30% of transfers.  He has been plus to max assist for      bed mobility with cues and problems occasionally with      participation.  He has been mod to max with ADLs depending on      participation as well.  Based on the patient's diagnoses, physical      exam, and functional history, the patient has potential for      functional progress which will result in measurable gains while in      inpatient rehab.  These gains will be of substantial practical use      upon discharge to home in facilitating mobility, self-care, etc.      He will be with his wife at home who will assist with care.      Interim changes in  medical status since preadmission screening are      detailed in history of  present illness above.  5. Physiatrist will provide 24-hour management of medical needs as      well as oversight of the therapy plan/treatment and provide      guidance as appropriate regarding interaction of the two.  Medical      problem list and plan is listed below.  6. 24-hour rehab nursing will assist in the management of safety      issues as well as skin care, bowel and bladder function, nutrition,      appropriate medication, safety awareness, and integration of      therapy concepts and techniques.  7. PT will assess and treat for balance, lower extremity      strengthening, neuromuscular reeducation, visual spatial awareness,      adaptive equipment, safety awareness, and family education with      goals supervision.  8. OT will assess and treat for upper extremity use, ADLs, adaptive      equipment, neuromuscular reeducation, and visual spatial awareness      as well as some cognitive perceptual issues, family education, etc.      Goals are supervision to perhaps occasional min assist.  9. Speech and language pathology will assess and treat for insight and      cognitive deficits with goals supervision.  10.Case management and social worker will assess and treat for      psychosocial issues and discharge planning.  11.Team conferences will be held weekly to assess progress towards      goals and to determine barriers to discharge.  12.The patient has demonstrated sufficient medical stability and      exercise capacity to tolerate at least 3 hours therapy per day at      least 5 days per week.  13.Estimated length of stay is approximately 10-14 days depending on      neurological recovery.  Prognosis is good.   MEDICAL PROBLEM LIST AND PLAN:  1. Seizure disorder:  Continue Keppra 1000 mg q.12 h.  Watch closely      for cognitive side effects with this high dose of medication.  2. Hypertension:   Maintain Norvasc and Lopressor current doses with      close observation.  Blood pressure remains borderline today.  We      will follow for appropriate control.  3. Diabetes:  Continue Amaryl 2 mg daily and Lantus 10 units b.i.d.      His sugars are borderline to control currently.  We will follow      with fluctuating diet and activity levels.  4. History of alcohol abuse:  Continue counseling.  5. Coronary artery disease:  Continue medications above.  The patient      is off Effient currently due to the bleeding complications.  6. Mood:  Consider Ritalin trial versus antidepressant.  May benefit      from neuropsychological evaluation as well.  7. Deep vein thrombosis prophylaxis with ambulation at this point as      well as SCDs and TEDs.      Ranelle Oyster, M.D.  Electronically Signed     ZTS/MEDQ  D:  04/14/2008  T:  04/15/2008  Job:  [id number]

## 2010-06-15 NOTE — Procedures (Signed)
EEG NUMBER:  [id number]   CLINICAL HISTORY:  The patient is a 77 year old admitted March 26, 2008, for subdural hematoma and had a right craniotomy for ongoing  bleeding April 03, 2008.  The patient had increased confusion and fell 2  days prior to admission.  He had vomiting.  He had a history of acute  myocardial infarction in August 2009 with angioplasty and stenting.  He  was placed on Effient, an antiplatelet agent. (293.0)   PROCEDURE:  The tracing is carried out on a 32-channel digital Cadwell  recorder reformatted into 16 channel montages with one devoted to EKG.  The patient was unresponsive.   MEDICATION:  Zocor, Amaryl, Norvasc, NovoLog, Klonopin, Toprol-XL,  Protonix, Lantus, Unasyn, Keppra, Apresoline, Atarax, Vicodin, Ativan,  Zofran, Normodyne, morphine sulfate, and Diprivan was started prior to  the study because of agitation.   DESCRIPTION OF FINDINGS:  Dominant frequency is a 1-2 Hz, 30 microvolt  delta range activity with occasional intermittent 25 microvolt, 7 Hz  theta range components.  During portions of the record, the right  hemisphere seems somewhat suppressed in comparison with the left.  The  patient's craniotomy defect, however is on the left side.  I do not see  a true breach artifact.   There was no interictal epileptiform activity in the form of spikes or  sharp waves.   EKG showed a regular sinus rhythm with ventricular response of 96 beats  per minute.   IMPRESSION:  Abnormal EEG on the basis of mild diffuse background  slowing.  This is a nonspecific indicator of neuronal dysfunction that  maybe on a primary degenerative basis or secondary to a variety of toxic  or metabolic etiologies.  No seizure activity was seen in the record,  and there was no definite focality other than the initial suppression of  the right brain, which did not persist throughout the record.      Deanna Artis. Sharene Skeans, M.D.  Electronically Signed     EAV:WUJW  D:   04/08/2008 21:25:05  T:  04/09/2008 04:35:54  Job #:  [id number]   cc:   Levert Feinstein, MD

## 2010-06-18 NOTE — Op Note (Signed)
NAME:  [name] Walker, [name] Walker                      ACCOUNT NO.:  [id number]   MEDICAL RECORD NO.:  [id number]                   PATIENT TYPE:  OBV   LOCATION:  0098                                 FACILITY:  [facility]   PHYSICIAN:  Mark C. Vernie Ammons, M.D.               DATE OF BIRTH:  1931-03-21   DATE OF PROCEDURE:  04/28/2003  DATE OF DISCHARGE:                                 OPERATIVE REPORT   PREOPERATIVE DIAGNOSIS:  Organic erectile dysfunction and Peyronie's  disease.   POSTOPERATIVE DIAGNOSIS:  Organic erectile dysfunction and Peyronie's  disease.   PROCEDURE:  Inflatable penile prosthesis ([place]).   SURGEON:  Mark C. Vernie Ammons, M.D.   ANESTHESIA:  General endotracheal.   ESTIMATED BLOOD LOSS:  Less than 25 mL.   DRAINS:  16 French Foley catheter.   SPECIMENS:  None.   COMPLICATIONS:  None.   DEVICE USED:  Bilateral 18 cm cylinders with 3 cm rear-tip extenders and a  75 mL reservoir.   INDICATIONS:  The patient is a 77 year old white male who has organic  erectile dysfunction and Peyronie's disease.  We have tried oral agents in  the past, but this has been ineffective.  His Peyronie's disease also has  resulted in dorsal penile curvature.  Because of the presence of Peyronie's  disease and multifactorial organic erectile dysfunction, he has elected to  proceed with inflatable penile prosthesis placement and understands the  risks, complications, alternatives, and limitations.   DESCRIPTION OF OPERATION:  After informed consent, the patient brought to  the major OR and placed on the table and administered general anesthesia.  Genitalia was then scrubbed for 10 minutes, then sterilely prepped with  Betadine and draped.  A 16 French Foley catheter was then inserted in the  bladder.  The bladder was drained and a median raphe scrotal incision was  then made, carried down to expose the corpora on each side.  A ring  retractor was used to afford exposure and the overlying  tissue over the  corpora was dissected, exposing the corpora lateral to the urethra on each  side.  Stay sutures were then placed in the corpora and corporotomies were  made between the stay sutures.  Metzenbaum scissors were then passed on the  lateral aspect of the corpora distally and proximally on both sides, and  then each side was dilated with the Hegar dilators from #7 to #14 mm.  Each  corpora was irrigated with antibiotic solution and the measuring device was  then used with both corpora measuring 9 cm proximally and 12 cm distally.   An 18 cm cylinder was then chosen and was then placed using the [facility]  inserter into the distal aspect of the corpora on both right and left sides.  Using the suture that pierced the glans and was affixed to the tip of the  prosthesis, these were pulled into the corpora partially.  The 3 cm rear-tip  extender was then placed on each cylinder and these were placed proximally.  I then seated the device and then attached sterile saline to the tubing and  pumped the device.  There was excellent distal extent of the device with  both cylinders felt in the glans region.  The erection was nearly straight  with just some slight dorsal curvature and I used some mild modeling to  achieve completely straight erection with the device completely inflated.  I  then deflated the device.  The 75 mL reservoir was chosen and through the  scrotal incision I was able to palpate the external inguinal ring, cleared  off overlying tissue bluntly, and used a Kelly clamp to pierce the medial  aspect of the external inguinal ring after completely emptying the bladder.  I then developed this space further digitally and placed the reservoir in  this location, filled it with 75 mL of sterile saline, and placed a shod  clamp on the tubing.  The excess tubing was then excised after placing shod  clamps on the tubing proximal, and the reservoir tubing was connected to the  pump  tubing with the supplied connector.  I then cycled the device again,  noting that it cycled properly.  I developed a scrotal pocket in the right  hemiscrotum beneath the scrotal skin and placed the pump in this location.  I then secured it to there with a single 3-0 silk suture by incorporating  the tissue above the pump in order to maintain it in its dependent location.  I irrigated copiously with antibiotic solution and then closed the  corporotomies with running 2-0 Vicryl suture.  The tubing was then buried  beneath the deep scrotal tissue and 3-0 chromic suture was used to secure  this in that location.  I then closed the scrotal incision in three layers  with running 3-0 chromic after irrigating with antibiotic solution.  The  suture attached to each of the cylinders was cut and removed from the glans  and the device was partially inflated.  Neosporin was applied to the scrotal  incision as well as fluffs, Kerlix, and a scrotal support.  The patient was  awakened and taken to the recovery room in stable, satisfactory condition.  He tolerated the procedure well with no intraoperative complication.  He did  receive 1 g of Ancef as well as 100 mg of gentamicin preoperatively and will  be maintained on these antibiotics postoperatively and observed overnight.                                               Mark C. Vernie Ammons, M.D.    MCO/MEDQ  D:  04/28/2003  T:  04/28/2003  Job:  [id number]

## 2010-06-18 NOTE — Cardiovascular Report (Signed)
[place]  Patient:    [name] Walker, [name] Walker                   MRN: [id number] Proc. Date: 07/23/99 Adm. Date:  [id number] Disc. Date: [id number] Attending:  Ruta Hinds CC:         Richard A. Alanda Amass, M.D.             Richard A. Jacky Kindle, M.D.             Cardiopulmonary Lab             Gilman Schmidt, M.D.                        Cardiac Catheterization  PROCEDURES: 1. Retrograde central aortic catheterization. 2. Selective coronary angiography, by Judkins technique 3. Left ventriculogram, RAO and LAO projections. 4. Intracoronary nitroglycerin administration. 5. Subselective LIMA and RIMA. 6. Abdominal aortogram, midstream PA projection.  DESCRIPTION OF PROCEDURE:  The patient was brought to the second floor CP lab in the postabsorptive state after 5 mg of Valium premedication.   On the same day admission he had normal preoperative laboratory; creatinine 1.4, glucose 169 (history of AODM).  The right groin was prepped and draped in the usual manner.  Xylocaine 1% was used for local anesthesia.  Hexabrix dye was used throughout the procedure.  The RFA was entered with a single anterior puncture using an 18-gauge thin-walled needle, and 6-French short Daig sidearm sheath was inserted without difficulty.  Catheterization was performed with 6-French 4 cm tapered preformed coronary pigtail catheters.  Intracoronary nitroglycerin (200 mcg) was given into the left coronary artery, with repeat injections obtained.  The right coronary catheter was used for subselective LIMA and RIMA.  Left ventriculogram was done in the RAO and LAO projections at 25 cc, 14 cc/sec; 20 cc, 12 cc/sec respectively.  Pullback pressures of the CA was performed.  There was no gradient across the aortic valve.  Abdominal aortic angiogram was done in the midstream PA projection, at 30 cc, 20 cc/sec.  The catheters were removed.  Sidearm sheath was  flushed. Underwent view of the patients cineangiograms.  He tolerated the procedure well.  He was transferred to the holding area for postoperative care and sheath removal with pressure hemostasis.  PRESSURES: 1. Left ventricular pressure:  153/08. 2. Left ventricular end-diastolic pressure:  16 mmHg. 3. Central aortic pressure:  153/79 mmHg.  There is no gradient across the aortic valve on catheter pullback.  CORONARY FLUOROSCOPY:  Did not reveal any coronary, intracardiac or valvular calcification. 1. LEFT MAIN CORONARY ARTERY:  Normal. 2. LEFT ANTERIOR DESCENDING ARTERY:   Had 40% smooth narrowing just beyond the    ostia, for approximately 15-20 mm.  There was good residual movement and    mild eccentricity to this narrowing. Normal TIMI-3 flow throughout the LAD.    The LAD coursed to the apex to the part where it bifurcated.  There was    another 20-30% smooth narrowing of the LAD at the junction of the proximal    mid-third at the level just after the second septal perforator and second    diagonal branch.  There was good flow through this, with no instance of    residual lumen.  The first diagonal branch was normal and bifurcated in    moderate size to the proximal side of the LAD.  The second diagonal branch    was normal, bifurcated  from the proximal just mid LAD, and there were two    other small diagonals that were normal. 3. CIRCUMFLEX CORONARY ARTERY: Composed of a large marginal branch that    trifurcated distally.  There was a normal atrial branch proximally.  The    PAVG branch was small, arose secondary mid-distal third of the circumflex.    There was an 80-90% segmental lesion in the mid portion of the PAVG branch    before its distal bifurcation; this was a small, thin vessel of    approximately 1.5 mm. 4. RIGHT CORONARY ARTERY: A dominant vessel; giving off a conus branch    proximally, two large RV branches in the mid portion.  It was widely    patent and  smooth throughout its course; bifurcated distally to two    normal branches.  LEFT VENTRICULOGRAM:  Obtained in the RAO and LAO projections.  Showed a normally contracting ventricle, with no segmental wall motion abnormalities. There was normal ______ ______  and no mitral regurgitation.  Angiographic LVH was present.  The LIMA and RIMA were widely patent.  There was no subclavian or brachial cephalic stenosis.  ABDOMINAL AORTOGRAM:  Showed normal single renal arteries bilaterally.  No significant infrarenal atherosclerotic disease, with normal runoff.  DISCUSSION:  This is very pleasant, married father of two with one grandchild is active (exercises on a regular basis).  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  He has occasional ETOH use.  Stopped smoking in 1968.  He does have a history of ARDF, hypertension and recent history of palpitations (suggesting APCs).  He had Cardiolite ordered by Dr. Jacky Kindle, which showed inferior ischemia (May 13, 1999), with normal EF of 59%.  There is some vague history of dyspnea on walking up a hill, but no other hidden symptoms to suggest angina.  Catheterization shows a single lesion in a very small PAVG branch.  He has smooth disease in the proximal LAD and proximal-mid third of the LAD that is not high-grade.  He does have a strong family history for coronary disease.  Fortunately, he has no critical coronary artery disease.  He does have mild, smooth disease of the ostium of the proximal LAD, proximal to mid-third of the LAD.  The PAVG branch lesion is in the small, thin vessel.  I would recommend medically therapy for this, with normal LV function.  The patient has been started on beta blockers pre-catheterization, which I would continue for noncritical coronary disease; along with his ACE inhibitors for diabetes.  He is a candidate for primary therapy with lipid-lowering agents,  depending on his levels.  CATHETERIZATION DIAGNOSES:  1. False positive Cardiolite, inferior ischemia. 2. Strong family history of coronary disease. 3. May 13, 1999 revealed noncritical coronary artery disease.    a. 40% smooth narrowing of the proximal LAD.    b. 20-30% proximal to mid LAD narrowing, smooth.    c. 80-90% mid small VG branch.    d. Normal LV. 4. Adult-onset diabetes mellitus. 5. Possible hyperlipidemia. 6. Systemic hypertension. 7. Remote cigarette abuse. 8. History of headaches, under the care of Dr. Gilman Schmidt. DD:  07/23/99 TD:  07/26/99 Job: [id number]

## 2010-06-18 NOTE — Op Note (Signed)
NAME:  [name] Walker, [name] Walker            ACCOUNT NO.:  [id number]   MEDICAL RECORD NO.:  [id number]          PATIENT TYPE:  AMB   LOCATION:  DAY                          FACILITY:  [facility]   PHYSICIAN:  Ollen Gross, M.D.    DATE OF BIRTH:  August 30, 1931   DATE OF PROCEDURE:  11/12/2003  DATE OF DISCHARGE:                                 OPERATIVE REPORT   PREOPERATIVE DIAGNOSIS:  Right shoulder impingement, acromioclavicular  arthrosis, rotator cuff tear.   POSTOPERATIVE DIAGNOSIS:  Right shoulder impingement, acromioclavicular  arthrosis, rotator cuff tear, plus labral tear.   PROCEDURE:  Right shoulder arthroscopy, with labral debridement, subacromial  decompression, distal clavicle resection, and rotator cuff repair.   SURGEON:  Ollen Gross, M.D.   ASSISTANT:  Alexzandrew L. Julien Girt, P.A.   ANESTHESIA:  Interscalene block plus general.   ESTIMATED BLOOD LOSS:  Minimal.   DRAIN:  None.   COMPLICATIONS:  None.   CONDITION:  Stable to recovery.   BRIEF CLINICAL NOTE:  Mr. Woodcox is a 77 year old male who has had a long  history of right shoulder pain and dysfunction.  Pain has worsened recently.  An MRI did show a small rotator cuff tear as well as impingement type  morphology.  He presents now for the above-mentioned procedure.   PROCEDURE IN DETAIL:  After successful administration of interscalene block  and general anesthetic, the patient was placed sitting upright in a beach  chair position, and his right upper extremity and shoulder girdle were  isolated from his trunk with plastic drapes and prepped and draped in the  usual sterile fashion.  The arthroscopic landmarks were marked and a small  incision made for the posterior portal.  Camera and cannula were passed into  the joint.  Arthroscopic visualization proceeded.  The glenohumeral joint  did show some full-thickness cartilage loss in the glenoid, very small focal  areas in the superior aspect of the glenoid.  The  humeral head just showed  some chondromalacia but no full-thickness cartilage loss.  The biceps tendon  appears to be subluxing inferiorly.  The undersurface of the supraspinatus  has a very high-grade partial tear, but it is not completely through.  It is  about a 0.5 x 1 cm area at the insertion.  A spinal needle was then used to  localize the anterior portal, a small incision made, and a cannula placed.  The labrum was debrided superiorly and anteriorly back to a stable base with  the shaver and an ArthroCare.  I probed the biceps tendon, and it was very  unstable.  Every time we abducted the shoulder, it would sublux inferiorly  into the joint.  I thus released the biceps tendon with the ArthroCare  device and ensured that the stump had exited the shoulder joint.  We also  probed down on the surface of the rotator cuff, and it was very close to  being a full-thickness tear.   The glenohumeral joint was exited and subacromial space entered.  Soft  tissue decompression was performed through the anterior portal as well as  the lateral portal.  We established  the lateral portal by localizing with a  needle, making a small incision.  Soft tissue decompression was completed  with the ArthroCare.  A small subacromial spur, and a 4 mm bur was used to  do the acromioplasty to create a flat undersurface of the acromion.  With  the anterior portal, we then resected about 1 cm of distal clavicle.  The  bursal surface of the rotator cuff was visualized, and there was no full-  thickness tear noted.   Given that it was a high-grade partial tear, I wanted to better visualize  and I extended the lateral portal incision for a total of about 2 cm.  The  skin was cut with a 10 blade through subcutaneous tissue, and then a split  was made between the fibers of the deltoid to get down to the proximal  humerus.  There was significant thinning in that one focal area of the  supraspinatus insertion.  I  probed and was easily able to pass through this  spot.  We then made a small incision in this area and debrided the edge of  the supraspinatus.  A small trough was created and a Mitek anchor passed  into the proximal humerus.  The free ends of the suture were passed through  the supraspinatus, and it was sewn back to the bone and advanced.  It was  felt to be a very stable repair.  The deltoid fascia was then closed with  interrupted 2-0 Vicryl, subcutaneous closed with interrupted 2-0 Vicryl,  subcuticular with a running 4-0 Monocryl.  The anterior and posterior  portals were closed with interrupted 4-0 nylon.  A bulky sterile dressing  was applied.  He was placed into a sling, awakened, and transported to  recovery in stable condition.      FA/MEDQ  D:  11/12/2003  T:  11/12/2003  Job:  [id number]

## 2010-06-18 NOTE — Op Note (Signed)
NAME:  [name] Walker, [name] Walker            ACCOUNT NO.:  [id number]   MEDICAL RECORD NO.:  [id number]          PATIENT TYPE:  AMB   LOCATION:  DAY                          FACILITY:  [facility]   PHYSICIAN:  Ollen Gross, M.D.    DATE OF BIRTH:  [birth year]   DATE OF PROCEDURE:  02/11/2004  DATE OF DISCHARGE:                                 OPERATIVE REPORT   PREOPERATIVE DIAGNOSIS:  Recurrent right rotator cuff tear.   POSTOPERATIVE DIAGNOSIS:  Recurrent right rotator cuff tear.   PROCEDURE:  Repair of recurrent right rotator cuff tear.   SURGEON:  Gus Rankin. Aluisio, M.D.   ASSISTANT:  Avel Peace, PA-C   ANESTHESIA:  General plus interscalene block.   ESTIMATED BLOOD LOSS:  Minimal.   DRAIN:  None.   COMPLICATIONS:  None.   CONDITION:  Stable to recovery.   BRIEF CLINICAL NOTE:  Mr. Loux is a 77 year old male, had a right  rotator cuff repair, subacromial decompression, distal clavicle resection  performed approximately 3-1/2 months ago.  During his rehab, he was  progressing extremely well and then attempted to lift something in an  abducted position and felt a pop in his shoulder.  He has had recurrent pain  and popping since.  MRI revealed a recurrent tear.  He presents now for  repair of the recurrent tear.   PROCEDURE IN DETAIL:  After the successful administration of interscalene  block and general anesthetic, he was placed sitting upright in a beach chair  position.  A longitudinal incision was made along the skin lines from  posterior to anterior just lateral to the [facility] joint.  The skin was cut with a  10 blade through subcutaneous tissue to the level of the deltoid fascia.  The subcu flaps are elevated circumferentially.  Deltoid is peeled off the  acromion anteriorly.  This revealed the subacromial space.  The  acromioplasty revealed a flat undersurface, and the [facility] joint had been  adequately resected last procedure.  He does have evidence of a recurrent  tear at the  site of his previous repair.  The suture was popped and loose in  the subacromial space.  The free edge of the tendon was debrided back to  healthy-appearing tissue, creating about a 1 cm tear altogether.  I created  a small trough and placed the Mitek anchor which was very stable in the  bone.  We then placed the sutures through the free edges of the tendon and  advanced into the trough and then oversewed it, tied the suture down.  This  was a very stable repair.  We then reattached the deltoid to the acromion  with a #1 Ethibond suture.  The wound was copiously irrigated and the subcu  closed with interrupted 2-0 Vicryl, subcuticular with running 4-0 Monocryl.  Incision is cleaned and dried and Steri-Strips and a bulky sterile dressing  applied.  He is then placed into a shoulder immobilizer, awakened, and  transported to recovery in stable condition.     Drenda Freeze   FA/MEDQ  D:  02/11/2004  T:  02/11/2004  Job:  [id number]

## 2010-06-18 NOTE — Op Note (Signed)
NAME:  [name] Walker, [name] Walker            ACCOUNT NO.:  [id number]   MEDICAL RECORD NO.:  [id number]          PATIENT TYPE:  AMB   LOCATION:  DSC                          FACILITY:  [facility]   PHYSICIAN:  Hinton Dyer, D.D.S.DATE OF BIRTH:  May 19, 1931   DATE OF PROCEDURE:  01/04/2005  DATE OF DISCHARGE:                                 OPERATIVE REPORT   PREOPERATIVE DIAGNOSIS:  Failing ramus frame of mandible.   POSTOPERATIVE DIAGNOSIS:  Failing ramus frame of mandible.   OPERATION/PROCEDURE:  Surgical removal of ramus frame.   ANESTHESIA:  General.   SURGEON:  Hinton Dyer, D.D.S.   ASSISTANT:  1.  Angelia Mould.  2.  Montel Culver.   ESTIMATED BLOOD LOSS:  50 mL.   CONDITION:  Good.   DESCRIPTION OF PROCEDURE:  Following preoperative medication, the patient  was brought to the operating room and placed in a supine position where she  remained throughout the whole procedure.  He was intubated by a right nasal  endotracheal tube and prepped and draped in the usual fashion for an  intraoral procedure. The mouth was packed off and prepped with Betadine  scrub and the face was also prepared with Betadine paint.  Two carpules of  2% Xylocaine with 1:100,000 epinephrine was given as bilateral blocks and  infiltration of the anterior mandible.  Total of 3 mL was given.  A 15-blade  was used to make an incision around the ramus on the upper right side to  expose the where the ramus entered the mandible.  A round bur and copious  irrigation were used to cut the frame in the anterior region on the right  side and once the frame was cut free, the rod entering the ramus was torqued  and began loosening up.  Bone was then removed from between the ramus frame  and the mandible and the ramus frame was ultimately came out.  The defect  was irrigated and closed with multiple 3-0 chromic sutures.  The ramus frame  was cut on the left side using a round bur and copious irrigation, and the  frame  was manipulated with a rongeur and then slipped out of the defect.  This defect was irrigated but not sutured.  A 15-blade made an incision  around the ramus frame where it entered the anterior mandible and a full-  thickness mucoperiosteal flap was elevated with the periosteal elevator.  The frame could be seen in several places emerging from the mandible and,  therefore, the mandible was cut away using the round bur and copious  irrigation.  Once enough of the frame was visible, it was cut on the left  side and the left segment was removed with a periosteal elevator.  The frame  was exposed on the right side and again once it was completely exposed and  free from the mandible, it was lifted off with a periosteal elevator.  The  mandible was checked.  No fractures were noted.  The area was irrigated and  the soft tissues were closed with multiple 3-0 chromic sutures.  The patient  tolerated the  procedure well and was returned to the recovery room in good  condition.   He was attended to and then discharged to home when ready.  Prescriptions  going home with him include Keflex 500 mg #28 one q.i.d. and Vicodin #20,  one or two q.4h. p.r.n. pain.  He will be checked by me in one week for  observation and further treatment.           ______________________________  Hinton Dyer, D.D.S.     JLM/MEDQ  D:  01/04/2005  T:  01/04/2005  Job:  [id number]

## 2010-06-18 NOTE — Discharge Summary (Signed)
NAME:  [name] Walker, [name] Walker                      ACCOUNT NO.:  [id number]   MEDICAL RECORD NO.:  [id number]                   PATIENT TYPE:  INP   LOCATION:  0471                                 FACILITY:  [facility]   PHYSICIAN:  Gus Rankin. Aluisio, M.D.              DATE OF BIRTH:  09-Jun-1931   DATE OF ADMISSION:  07/30/2001  DATE OF DISCHARGE:  08/03/2001                                 DISCHARGE SUMMARY   ADMISSION DIAGNOSES:  1. Loose left total knee arthroplasty.  2. Diabetes mellitus.  3. Hypertension.  4. Gastroesophageal reflux disease.  5. Headaches.  6. Depression.   DISCHARGE DIAGNOSES:  1. 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  2. Postoperative mild hyponatremia.  3. Postoperative positive fluid balance.  4. Mild postoperative anemia (did not require transfusion).  5. Diabetes mellitus.  6. Hypertension.  7. Gastroesophageal reflux disease.  8. Headaches.  9. Depression.   PROCEDURE:  The patient was taken to the OR on July 30, 2001 and underwent a  revision of a left total knee arthroplasty; surgeon was Dr. Trudee Grip,  assistant Avel Peace, P.A.C.  Surgery done under general anesthesia and a  femoral block.  Hemovac drain x1.  Tourniquet time was 67 minutes at 350  mmHg, then down for 10 minutes, then up an additional 26 minutes at 350  mmHg.   CONSULTS:  None.   BRIEF HISTORY:  The patient is a 77 year old male who has had a painful,  stiff left total knee that was initially done back in 1996 by Dr. Criss Alvine but  never quite led to full pain relief.  He had a lot of stiffness and never  achieved more than 80% of flexion.  He had a lateral release done in 1998  but did not feel that this was much benefit.  He had a bone scan in 2001  which did show increased uptake in the tibial components consistent with  probable loosening.  It was felt he would benefit from undergoing surgery,  and risks and benefits  discussed.  He is subsequently admitted to the  hospital.   LABORATORY DATA:  CBC on admission:  Hemoglobin 13.0, hematocrit 37.9, white  cell count 5.6, red cell count 3.98.  Postoperative H&H 10.4 and 30.0.  Last  noted H&H stabilized at 9.8 and 27.7.  Differential on the admission CBC all  within normal limits.  PT/PTT on admission 13.2 and 32 respectively.  Serial  protimes followed per Coumadin protocol.  Last noted PT/INR 23.4 and 2.4.  Chem panel on admission:  Elevated glucose 145, did increase up to 199.  Sodium started out at 137 and dropped down to 133; it stabilized at 133  prior to his discharge.  Calcium dropped from 8.7 to 7.8, was back up to  8.0.  Urinalysis on admission negative with the exception of just trace  leukocyte esterase.  Blood group and type O negative.   EKG dated July 25, 2001:  Normal sinus rhythm, no significant change since  last tracing of February 19, 1997 - confirmed by Dr. Roger Shelter.  Two-  views of the knee of July 30, 2001 showed good position alignment following  a left total knee replacement.   HOSPITAL COURSE:  The patient was admitted to [facility], was  taken to the OR and underwent the above-stated without complications.  The  patient tolerated the procedure well and was later discharged to the  recovery and then to the orthopedic floor for continued postoperative care.  The patient was placed on 24 hours of postoperative antibiotics and also PC  analgesia for pain control following surgery.  Hemovac drain which was  placed at the time of surgery was pulled on postoperative day #1.  The  patient did have some positive fluid balance and underwent some mild  diuresis.  One of his diabetic medications was held; once his BUN and  creatinine had been established as normal, his Amaryl was restarted.  The  patient did have a low-grade temperature postoperatively, encouraged  incentive spirometer.  PT/OT was consulted for gait  training ambulation and  ADLs.  The patient did progress well with physical therapy.  Dressing change  initiated on postoperative day #2.  Incision was healing well.  By  postoperative day #3 he was up doing more ambulation.  Discharge planning  had been consulted to assist with post hospital care.  The patient was to be  set up with [facility].  By postoperative day #4 the patient had been  weaned over to p.o. analgesics, was up ambulating greater than 250 feet,  pain was under control, and was discharged home.   DISCHARGE MEDICATIONS/PLAN:  1. The patient was discharged home on August 03, 2001.  2. Discharge diagnoses:  Please see above.  3. Discharge medications:  Percocet for pain, Trinsicon for two additional     weeks, Robaxin for spasm, Coumadin as per pharmacy protocol.  4. Diet:  Diabetic diet.  5. Activity:  Weightbearing as tolerated.  PT and home health nursing per     [facility]; total knee protocol.  6. Follow up two weeks from surgery; call the office for an appointment.   CONDITION ON DISCHARGE:  Improved.         Alexzandrew L. Julien Girt, P.A.              Gus Rankin Aluisio, M.D.    ALP/MEDQ  D:  09/10/2001  T:  09/13/2001  Job:  [id number]

## 2010-06-18 NOTE — Op Note (Signed)
[facility]  Patient:    [name] Walker, [name] Walker Visit Number: [id number] MRN: [id number]          Service Type: SUR Location: 4W 0471 01 Attending Physician:  Loanne Drilling Dictated by:   Ollen Gross, M.D. Proc. Date: 07/30/01 Admit Date:  07/30/2001                             Operative Report  PREOPERATIVE DIAGNOSIS:  Failed left total knee arthroplasty.  POSTOPERATIVE DIAGNOSIS:  Failed left total knee arthroplasty.  PROCEDURE:  Left total knee arthroplasty and revision.  SURGEON:  Ollen Gross, M.D.  ASSISTANT:  Alexzandrew L. Perkins, P.A.-C.  ANESTHESIA:  General plus femoral block to the anesthesia.  ESTIMATED BLOOD LOSS:  Minimal.  ESTIMATED BLOOD LOSS:  Minimal.  DRAINS:  Hemovac x 1.  COMPLICATIONS:  None.  TOURNIQUET TIME:  About 67 minutes at 350 mmHg and then down ten minutes and then up an additional 26 minutes at 350 mmHg.  BRIEF CLINICAL NOTE:  [name] Walker is a 77 year old male who had a left total knee arthroplasty approximately six years ago.  He has had pain and stiffness since the procedure.  Work up is all negative.  Range of motion preop 10-75. Radiographs suggest loosening of the tibial tray and bone scan also suggested this.  He presents now for left total knee arthroplasty revision.  PROCEDURE IN DETAIL:  After the successful administration of general anesthesia the tourniquet was placed high on the left thigh and left thigh and left upper extremity were prepped and draped in the usual sterile fashion. Extremities were wrapped with Esmarch, knee flexed, tourniquet inflated to 300 mmHg.  A midline incision was made.  Fresh blade was used to make a medial parapatellar arthrotomy.  He had a dense thickened layer of scar tissue about one inch thick which was all excised in the suprapatellar area.  We then recreated suprapatellar pouches.  The soft tissue over the proximal and medial tibia subperiosteally  elevated to the joint line with a knife.  Any other scar was removed there.  MCLs were protected.  I decided to do a quadriceps snip proximally in order to safely evert the patella.  This was performed and the patella was easily everted.  All the wear debris was excised to give it clean appearing joint.  As stated, he only had flexion to 75 preoperatively.  Once we got the joint open, then did all these maneuvers, I can get him to 90.  I then released a PCL and we got him to 100.  We removed the PCL.  He had LCS meniscal bearing made and meniscal bearings removed.  The femur was then removed by disrupting the interface between the bone and prosthesis.  Minimal bone loss occurred with removal of the prosthesis.  The tibial side we were able to remove but it was interesting that we found bone overgrowing the posterior tracts of both the meniscal bearings.  Thus, it seems like this posteriorly had subsided it and bone overgrew it possibly contributing to the stiffening.  Once we removed that bone, we fully exposed the tray and then the tray was easily removed.  We then irrigated the joint and repaired the canals reaming up to 16 mmHg on the tibial side in order to place the 15 mm cemented stem and up to 20 x 125 on the femoral side for Pressfit stem.  Once the stems were  reamed, then we put the dummy stent down the tibial canal and made the tibial cut proximally removing about two more mm of bone.  This was a nice tibial surface with no bone deficiency.  I did the trial 15 x 60 stem with a size 3 tibia and we had great curvature of the JVO disk.  We removed the trial and then put the 20 mmHg dummy stem in the femoral side. Distal femoral cut showed there was a slight deficiency distally with a 5 degree left valgus and we put a 4 mm disk along the medial and lateral sides. The AP cutting block was then placed with the +2 position so as to effectively raise the stem and lower the anterior  prosthesis so we would be up against the anterior cortex of the femur.  We put a 15 mm spacer intact for rotation.  The rotation did correspond to the epicondylar axis.  Anterior and posterior cuts were made without deficiencies.  The Chamfer cuts were made over the Chamfer block.  The trial tibial stem was placed once again.  The trial femur which was a size 3 femur with a 20 x 125 stem and 4 mm distal medial and lateral augments placed.  I put a 17.5 mm spacer first and then full extension was achieved with slight toggle and varus valgus plane was up to 20.  We still achieved full extension and had great balance in flexion and extension with absolutely no instability.  We used a stabilized plush prosthesis.  The trials were then kept in place and the patella was removed.  He had a metal backed patella but removed it without any bone loss.  It was 12 mm thick and we cut it down to 10 to create a flat surface and then drilled the lug holes for the trial 38.  We put a towel clip over the quads and patellar tract normally. The trial left in place and the tourniquet then released.  The total time was 67 minutes.  I removed the poly spacer.  The trial spacer was left in the back of the knee and there was no evidence of any significant bleeding.  Minor bleeding was stopped anteriorly with electrocautery.  Trial was left in place while the components are put together on the back table.  We thoroughly irrigated the joint.  The components are as follows:  On the tibial side it was a 15 x 60 cemented stem with a size #3 tibia and on the femoral side a 20 x 125 stem with a +2 position on the size 3 posterior stabilized femur with 4 mm distal medial and lateral augments.  Once these are put together then the leg is rewrapped with Esmarch after the tourniquet was down for ten minutes.  The tourniquet reinflated to 300 mmHg and all trials were removed.  We prepared the proximal tibia with the Kiel  punch.  The cut bone surfaces were prepared with pulsatile lavage and cement mixed.  I put the cement restrictor size 5 to the  appropriate level in the tibial canal.  We then injected the cement in the tibial canal and placed the tibial component.  Extruded cements were removed. Trial spacers were placed in the femoral side and cemented distally pressfit in the stem.  We then cemented a 38 patella and held with a clamp.  All extruded cements were removed.  Once the cement was fully hardened and the permanent 20 mm posterior stabilized plus inserts  placed in the tibial tray, the knee was reduced and there was great balance throughout the entire range of motion.  The wounds were copiously irrigated with antibiotic solution and extensor mechanism closed over Hemovac drain with #1 PDS.  Wide snip was also closed.  Flexion against gravity after good snip in retinaculum were closed 105 degrees.  The subcutaneous was closed with interrupted 2-0 Vicryl.  The tourniquet was let down and staples applied.  Drain hooked to suction, incision clean and dry and bulky sterile dressing applied.  Patient then awakened and transferred to the recovery room in stable condition. Dictated by:   Ollen Gross, M.D. Attending Physician:  Loanne Drilling DD:  07/30/01 TD:  08/01/01 Job: [id number]

## 2010-11-02 LAB — GLUCOSE, CAPILLARY
Glucose-Capillary: 187 — ABNORMAL HIGH
Glucose-Capillary: 208 — ABNORMAL HIGH

## 2010-11-03 LAB — GLUCOSE, CAPILLARY: Glucose-Capillary: 178 — ABNORMAL HIGH

## 2011-02-07 ENCOUNTER — Other Ambulatory Visit: Payer: Self-pay | Admitting: Internal Medicine

## 2011-02-07 ENCOUNTER — Encounter: Payer: Self-pay | Admitting: Neurology

## 2011-02-07 DIAGNOSIS — R413 Other amnesia: Secondary | ICD-10-CM

## 2011-02-10 ENCOUNTER — Ambulatory Visit
Admission: RE | Admit: 2011-02-10 | Discharge: 2011-02-10 | Disposition: A | Discharge status: Home / Self Care | Payer: Medicare Other | Source: Ambulatory Visit | Attending: Internal Medicine | Admitting: Internal Medicine

## 2011-02-10 DIAGNOSIS — R413 Other amnesia: Secondary | ICD-10-CM

## 2011-02-21 ENCOUNTER — Telehealth: Payer: Self-pay | Admitting: Gastroenterology

## 2011-02-21 NOTE — Telephone Encounter (Signed)
Talked to [name] Walker and she stated that they are going to have to postpone colon for right now because patient is having other health problems right now. She stated that blood sugars are running in the 800's and patient having diarrhea. Have a  PCP appointment soon. Will call back when they can schedule Colon.

## 2011-02-22 ENCOUNTER — Emergency Department (HOSPITAL_COMMUNITY): Payer: Medicare Other

## 2011-02-22 ENCOUNTER — Encounter (HOSPITAL_COMMUNITY): Payer: Self-pay | Admitting: Emergency Medicine

## 2011-02-22 ENCOUNTER — Inpatient Hospital Stay (HOSPITAL_COMMUNITY)
Admission: EM | Admit: 2011-02-22 | Discharge: 2011-02-28 | DRG: 638 | Disposition: A | Discharge status: Skilled Nursing Facility | Payer: Medicare Other | Attending: Internal Medicine | Admitting: Internal Medicine

## 2011-02-22 DIAGNOSIS — I69998 Other sequelae following unspecified cerebrovascular disease: Secondary | ICD-10-CM

## 2011-02-22 DIAGNOSIS — Z79899 Other long term (current) drug therapy: Secondary | ICD-10-CM

## 2011-02-22 DIAGNOSIS — R05 Cough: Secondary | ICD-10-CM | POA: Diagnosis not present

## 2011-02-22 DIAGNOSIS — Z7982 Long term (current) use of aspirin: Secondary | ICD-10-CM

## 2011-02-22 DIAGNOSIS — Z9119 Patient's noncompliance with other medical treatment and regimen: Secondary | ICD-10-CM

## 2011-02-22 DIAGNOSIS — I10 Essential (primary) hypertension: Secondary | ICD-10-CM | POA: Diagnosis present

## 2011-02-22 DIAGNOSIS — Z9861 Coronary angioplasty status: Secondary | ICD-10-CM

## 2011-02-22 DIAGNOSIS — R413 Other amnesia: Secondary | ICD-10-CM

## 2011-02-22 DIAGNOSIS — I639 Cerebral infarction, unspecified: Secondary | ICD-10-CM

## 2011-02-22 DIAGNOSIS — R059 Cough, unspecified: Secondary | ICD-10-CM | POA: Diagnosis not present

## 2011-02-22 DIAGNOSIS — I999 Unspecified disorder of circulatory system: Secondary | ICD-10-CM | POA: Diagnosis present

## 2011-02-22 DIAGNOSIS — N179 Acute kidney failure, unspecified: Secondary | ICD-10-CM | POA: Diagnosis present

## 2011-02-22 DIAGNOSIS — E1101 Type 2 diabetes mellitus with hyperosmolarity with coma: Principal | ICD-10-CM | POA: Diagnosis present

## 2011-02-22 DIAGNOSIS — F0789 Other personality and behavioral disorders due to known physiological condition: Secondary | ICD-10-CM | POA: Diagnosis present

## 2011-02-22 DIAGNOSIS — E1100 Type 2 diabetes mellitus with hyperosmolarity without nonketotic hyperglycemic-hyperosmolar coma (NKHHC): Secondary | ICD-10-CM

## 2011-02-22 DIAGNOSIS — E869 Volume depletion, unspecified: Secondary | ICD-10-CM | POA: Diagnosis present

## 2011-02-22 DIAGNOSIS — G40909 Epilepsy, unspecified, not intractable, without status epilepticus: Secondary | ICD-10-CM | POA: Diagnosis present

## 2011-02-22 DIAGNOSIS — E871 Hypo-osmolality and hyponatremia: Secondary | ICD-10-CM | POA: Diagnosis present

## 2011-02-22 DIAGNOSIS — K5900 Constipation, unspecified: Secondary | ICD-10-CM | POA: Diagnosis present

## 2011-02-22 DIAGNOSIS — I2510 Atherosclerotic heart disease of native coronary artery without angina pectoris: Secondary | ICD-10-CM | POA: Diagnosis present

## 2011-02-22 DIAGNOSIS — Z794 Long term (current) use of insulin: Secondary | ICD-10-CM

## 2011-02-22 DIAGNOSIS — Z91199 Patient's noncompliance with other medical treatment and regimen due to unspecified reason: Secondary | ICD-10-CM

## 2011-02-22 DIAGNOSIS — R5381 Other malaise: Secondary | ICD-10-CM | POA: Diagnosis present

## 2011-02-22 DIAGNOSIS — F1011 Alcohol abuse, in remission: Secondary | ICD-10-CM | POA: Diagnosis present

## 2011-02-22 DIAGNOSIS — R918 Other nonspecific abnormal finding of lung field: Secondary | ICD-10-CM | POA: Diagnosis present

## 2011-02-22 HISTORY — DX: Traumatic subdural hemorrhage with loss of consciousness status unknown, initial encounter: S06.5XAA

## 2011-02-22 HISTORY — DX: Unspecified osteoarthritis, unspecified site: M19.90

## 2011-02-22 HISTORY — DX: Traumatic subdural hemorrhage with loss of consciousness of unspecified duration, initial encounter: S06.5X9A

## 2011-02-22 HISTORY — DX: Headache: R51

## 2011-02-22 HISTORY — DX: Blindness, one eye, unspecified eye: H54.40

## 2011-02-22 HISTORY — DX: Anemia, unspecified: D64.9

## 2011-02-22 HISTORY — DX: Hyperlipidemia, unspecified: E78.5

## 2011-02-22 HISTORY — DX: Unspecified convulsions: R56.9

## 2011-02-22 HISTORY — DX: Atherosclerotic heart disease of native coronary artery without angina pectoris: I25.10

## 2011-02-22 HISTORY — DX: Essential (primary) hypertension: I10

## 2011-02-22 LAB — URINALYSIS, ROUTINE W REFLEX MICROSCOPIC
Glucose, UA: >1000 mg/dL — AB
Hgb urine dipstick: NEGATIVE
Leukocytes, UA: NEGATIVE
Protein, ur: NEGATIVE mg/dL
Specific Gravity, Urine: 1.043 — ABNORMAL HIGH (ref 1.005–1.030)
pH: 5 (ref 5.0–8.0)

## 2011-02-22 LAB — GLUCOSE, CAPILLARY: Glucose-Capillary: 525 mg/dL — ABNORMAL HIGH (ref 70–99)

## 2011-02-22 LAB — CBC
HCT: 34.8 % — ABNORMAL LOW (ref 39.0–52.0)
MCH: 29.7 pg (ref 26.0–34.0)
MCHC: 33.6 g/dL (ref 30.0–36.0)
RDW: 13.3 % (ref 11.5–15.5)

## 2011-02-22 LAB — COMPREHENSIVE METABOLIC PANEL
AST: 8 U/L (ref 0–37)
Albumin: 3.3 g/dL — ABNORMAL LOW (ref 3.5–5.2)
BUN: 25 mg/dL — ABNORMAL HIGH (ref 6–23)
Chloride: 89 mEq/L — ABNORMAL LOW (ref 96–112)
Creatinine, Ser: 1.37 mg/dL — ABNORMAL HIGH (ref 0.50–1.35)
Total Bilirubin: 0.4 mg/dL (ref 0.3–1.2)
Total Protein: 7.8 g/dL (ref 6.0–8.3)

## 2011-02-22 LAB — DIFFERENTIAL
Basophils Absolute: 0.1 10*3/uL (ref 0.0–0.1)
Basophils Relative: 0 % (ref 0–1)
Eosinophils Absolute: 0.1 10*3/uL (ref 0.0–0.7)
Monocytes Absolute: 1.1 10*3/uL — ABNORMAL HIGH (ref 0.1–1.0)
Neutro Abs: 10.8 10*3/uL — ABNORMAL HIGH (ref 1.7–7.7)
Neutrophils Relative %: 82 % — ABNORMAL HIGH (ref 43–77)

## 2011-02-22 LAB — URINE MICROSCOPIC-ADD ON

## 2011-02-22 MED ORDER — INSULIN ASPART 100 UNIT/ML ~~LOC~~ SOLN
0.0000 [IU] | Freq: Three times a day (TID) | SUBCUTANEOUS | Status: AC
  Administered 2011-02-23: 3 [IU] via SUBCUTANEOUS
  Administered 2011-02-23 (×2): 15 [IU] via SUBCUTANEOUS
  Filled 2011-02-22: qty 3

## 2011-02-22 MED ORDER — ACETAMINOPHEN 500 MG PO TABS
1000.0000 mg | ORAL_TABLET | Freq: Four times a day (QID) | ORAL | Status: DC | PRN
  Administered 2011-02-24 – 2011-02-25 (×2): 1000 mg via ORAL
  Filled 2011-02-22 (×2): qty 2

## 2011-02-22 MED ORDER — ASPIRIN EC 81 MG PO TBEC
81.0000 mg | DELAYED_RELEASE_TABLET | Freq: Every day | ORAL | Status: DC
  Administered 2011-02-22 – 2011-02-27 (×6): 81 mg via ORAL
  Filled 2011-02-22 (×9): qty 1

## 2011-02-22 MED ORDER — SODIUM CHLORIDE 0.9 % IV BOLUS (SEPSIS)
500.0000 mL | Freq: Once | INTRAVENOUS | Status: AC
  Administered 2011-02-22: 500 mL via INTRAVENOUS

## 2011-02-22 MED ORDER — GUAIFENESIN 100 MG/5ML PO SOLN
5.0000 mL | ORAL | Status: DC | PRN
  Administered 2011-02-22 – 2011-02-25 (×7): 100 mg via ORAL
  Filled 2011-02-22 (×7): qty 10

## 2011-02-22 MED ORDER — PANTOPRAZOLE SODIUM 40 MG PO TBEC
40.0000 mg | DELAYED_RELEASE_TABLET | Freq: Every day | ORAL | Status: DC
  Administered 2011-02-22 – 2011-02-28 (×8): 40 mg via ORAL
  Filled 2011-02-22 (×11): qty 1

## 2011-02-22 MED ORDER — ENOXAPARIN SODIUM 40 MG/0.4ML ~~LOC~~ SOLN
40.0000 mg | SUBCUTANEOUS | Status: DC
  Administered 2011-02-22 – 2011-02-26 (×5): 40 mg via SUBCUTANEOUS
  Filled 2011-02-22 (×8): qty 0.4

## 2011-02-22 MED ORDER — INSULIN ASPART 100 UNIT/ML ~~LOC~~ SOLN
10.0000 [IU] | Freq: Once | SUBCUTANEOUS | Status: AC
  Administered 2011-02-22: 10 [IU] via INTRAVENOUS
  Filled 2011-02-22: qty 1

## 2011-02-22 MED ORDER — INSULIN GLARGINE 100 UNIT/ML ~~LOC~~ SOLN
20.0000 [IU] | Freq: Every day | SUBCUTANEOUS | Status: DC
  Administered 2011-02-22 – 2011-02-23 (×2): 20 [IU] via SUBCUTANEOUS
  Filled 2011-02-22: qty 3

## 2011-02-22 MED ORDER — LISINOPRIL 5 MG PO TABS
5.0000 mg | ORAL_TABLET | Freq: Every day | ORAL | Status: DC
  Administered 2011-02-22 – 2011-02-28 (×7): 5 mg via ORAL
  Filled 2011-02-22 (×8): qty 1

## 2011-02-22 MED ORDER — INSULIN REGULAR HUMAN 100 UNIT/ML IJ SOLN: 10.0000 [IU] | Freq: Once | INTRAMUSCULAR | Status: DC

## 2011-02-22 MED ORDER — IOHEXOL 300 MG/ML  SOLN
80.0000 mL | Freq: Once | INTRAMUSCULAR | Status: AC | PRN
  Administered 2011-02-22: 80 mL via INTRAVENOUS

## 2011-02-22 MED ORDER — LEVETIRACETAM 500 MG PO TABS
500.0000 mg | ORAL_TABLET | Freq: Every day | ORAL | Status: DC
  Administered 2011-02-22 – 2011-02-28 (×7): 500 mg via ORAL
  Filled 2011-02-22 (×8): qty 1

## 2011-02-22 MED ORDER — VITAMIN D3 25 MCG (1000 UNIT) PO TABS
1000.0000 [IU] | ORAL_TABLET | Freq: Every day | ORAL | Status: DC
  Administered 2011-02-23 – 2011-02-28 (×6): 1000 [IU] via ORAL
  Filled 2011-02-22 (×7): qty 1

## 2011-02-22 MED ORDER — METOPROLOL SUCCINATE ER 50 MG PO TB24
50.0000 mg | ORAL_TABLET | Freq: Every day | ORAL | Status: DC
  Administered 2011-02-22 – 2011-02-28 (×7): 50 mg via ORAL
  Filled 2011-02-22 (×8): qty 1

## 2011-02-22 MED ORDER — SODIUM CHLORIDE 0.9 % IV SOLN
INTRAVENOUS | Status: DC
  Administered 2011-02-22 – 2011-02-23 (×4): via INTRAVENOUS

## 2011-02-22 NOTE — ED Notes (Signed)
Per ems blood sugar was 464 upon arrival. ems sts that house in which pt lives is disheveled. Wife accompaying pt to hospital. Per secretary, pt's medical MD Dr. Jacky Kindle called this ed informing that pt should come due to pt's diabetic condition and lack of appropriate care for diabetes at home.

## 2011-02-22 NOTE — ED Notes (Signed)
WUJ:[id number]<JY> Expected date:02/22/11<BR> Expected time: 2:10 PM<BR> Means of arrival:Ambulance<BR> Comments:<BR> EMS 10 GC - weakness

## 2011-02-22 NOTE — ED Notes (Signed)
With the information Dr. Jacky Kindle provided, regarding ongoing attempts to have pt's spouse and son assist with administration of medication, and Adult Protective Services report has been made in order to have further investigation of pt's needs. Inpatient CSW to continue following to ensure discharge plan of patient.

## 2011-02-22 NOTE — ED Notes (Signed)
Patient aware of need for urine specimen. Patient unable to void at this time. Patient given urinal. Encouraged to call for assistance if needed.   

## 2011-02-22 NOTE — ED Provider Notes (Addendum)
History     CSN: [id number]  Arrival date & time 02/22/11  1410   First MD Initiated Contact with Patient 02/22/11 1507      Chief Complaint  Patient presents with  . Hyperglycemia    (Consider location/radiation/quality/duration/timing/severity/associated sxs/prior treatment) HPI... patient has no history of diabetes. Takes both insulin and oral hypoglycemics. Patient's primary care physician is concerned the patient has not get appropriate care at home. Patient has no specific complaints. No chest pain, shortness of breath, fever, chills, dysuria, stiff neck.  Nothing makes his sugar better or worse.  Duration is unknown. Level V caveat for urgent need for intervention  Past Medical History  Diagnosis Date  . Coronary artery disease     No past surgical history on file.  No family history on file.  History  Substance Use Topics  . Smoking status: Not on file  . Smokeless tobacco: Not on file  . Alcohol Use:       Review of Systems  Unable to perform ROS   Allergies  Review of patient's allergies indicates no known allergies.  Home Medications   Current Outpatient Rx  Name Route Sig Dispense Refill  . ACETAMINOPHEN 500 MG PO TABS Oral Take 1,000 mg by mouth every 6 (six) hours as needed. For pain/headache    . ASPIRIN EC 81 MG PO TBEC Oral Take 81 mg by mouth at bedtime.    Marland Kitchen VITAMIN D 1000 UNITS PO TABS Oral Take 1,000 Units by mouth daily.    . DULOXETINE HCL 60 MG PO CPEP Oral Take 60 mg by mouth daily.    . INSULIN LISPRO (HUMAN) 100 UNIT/ML Collins SOLN Subcutaneous Inject 10 Units into the skin as needed. Inject 10 units sq for blood sugar over 200 as directed.    Marland Kitchen LEVETIRACETAM 500 MG PO TABS Oral Take 500 mg by mouth daily.    Marland Kitchen LISINOPRIL 5 MG PO TABS Oral Take 5 mg by mouth daily.    Marland Kitchen METFORMIN HCL 1000 MG PO TABS Oral Take 1,000 mg by mouth 2 (two) times daily with a meal.    . METOPROLOL SUCCINATE ER 50 MG PO TB24 Oral Take 50 mg by mouth daily. Take  with or immediately following a meal.    . OVER THE COUNTER MEDICATION Oral Take 1 capsule by mouth at bedtime. [employer] brand sleep aid      BP 127/68  Pulse 95  Temp(Src) 97.9 F (36.6 C) (Oral)  Resp 20  SpO2 95%  Physical Exam  Nursing note and vitals reviewed. Constitutional: He is oriented to person, place, and time. He appears well-developed and well-nourished.       Slightly dehydrated, alert  HENT:  Head: Normocephalic and atraumatic.  Eyes: Conjunctivae and EOM are normal. Pupils are equal, round, and reactive to light.  Neck: Normal range of motion. Neck supple.  Cardiovascular: Normal rate and regular rhythm.   Pulmonary/Chest: Effort normal and breath sounds normal.  Abdominal: Soft. Bowel sounds are normal.  Musculoskeletal: Normal range of motion.  Neurological: He is alert and oriented to person, place, and time.  Skin: Skin is warm and dry.  Psychiatric: He has a normal mood and affect.    ED Course  Procedures (including critical care time)  Labs Reviewed  GLUCOSE, CAPILLARY - Abnormal; Notable for the following:    Glucose-Capillary 516 (*)    All other components within normal limits  GLUCOSE, CAPILLARY - Abnormal; Notable for the following:    Glucose-Capillary  525 (*)    All other components within normal limits  POCT CBG MONITORING  CBC  DIFFERENTIAL  COMPREHENSIVE METABOLIC PANEL  URINALYSIS, ROUTINE W REFLEX MICROSCOPIC  POCT CBG MONITORING   Dg Chest 2 View  02/22/2011  *RADIOLOGY REPORT*  Clinical Data: Cough  CHEST - 2 VIEW  Comparison: 04/10/2008  Findings: Normal heart size. Linear atelectasis at the left base. On the lateral view, at the posterior costophrenic angle, there is a 1.5 cm spiculated opacity.  Pulmonary nodule is not excluded.  No pneumothorax and no pleural effusion.  The aorta remains tortuous. Thorax is rotated to the right limiting visualization of the mediastinum.  IMPRESSION: Possible posterior basilar spiculated nodule as  described.  CT chest is recommended to further characterize.  See lateral view.  Original Report Authenticated By: Donavan Burnet, M.D.     No diagnosis found.    MDM  Will obtain a social work consult at primary care physician's request..  screening labs.  discussed with Dr. Jacky Kindle  1830:   Discussed with Dr. Evlyn Kanner .  He will let Dr. Jacky Kindle not this patient needs to be admitted     Donnetta Hutching, MD 02/22/11 1641  Donnetta Hutching, MD 02/22/11 [phone]

## 2011-02-22 NOTE — Plan of Care (Signed)
Problem: Consults Goal: Diagnosis-Diabetes Mellitus Outcome: Completed/Met Date Met:  02/22/11 Hyperglycemia     

## 2011-02-22 NOTE — H&P (Signed)
PCP:   Geoffry Paradise M.D.   Chief Complaint:  Confusion and elevated blood sugars  HPI: [name] Walker is a very pleasant long-standing patient of mine who is brought in today upon my phone call to the wife telling her to call 911 and have him transported to the emergency room. The current illness goes back at least 2 weeks and he spent seen in my office at least 3-4 times when the initial presentation was memory decline and it was obvious at that evaluation he had significant polyuria and polydipsia. Workup did include an MRI which revealed a remote frontal lobe stroke clearly not causative in the current condition. More importantly he had multiple metabolic arrangements to include a blood sugar approaching 1000 and significant hyponatremia. Wife and INR to have a discussion on at least 2 prior visits that with a rising hemoglobin A1c that he was not getting his insulin medications as instructed, review of pharmacy records demonstrated meds were not being filled as instructed therefore there were compliance issues leading up to even this event under the best of circumstances. Wife was asked to monitor and give insulin but this had not happened. With his son present at this point stay were firmly instructed to give all insulin and provide with sliding scale and Lantus with subsequent followup the patient was improving. On at least a second and third visit similar findings were undertaken and I did threaten Adult Protective Services with the reassurance from son that insulin and blood sugars would be given and monitored again my reinforcement even prior to this he was not getting what he needed. Despite all of this he continues to have high blood sugars, confusion, and ultimately with an inability to get any type of cooperation or compliance from both son and wife with the threat of my calling the police department wife did summon EMS to transport my to the emergency room. In the emergency room he is awake pleasant  conversant knows me knows that he is here and blood sugar certainly was elevated but he was not acidotic. He denies any complaints at this time and is eating a full meal. He is admitted with basically a hyperosmolar state for stabilization with transition to institutionalization at least short-term.  Review of Systems:  The patient denies anorexia, fever, weight loss,, vision loss, decreased hearing, hoarseness, chest pain, syncope, dyspnea on exertion, peripheral edema, balance deficits, hemoptysis, abdominal pain, melena, hematochezia, severe indigestion/heartburn, hematuria, incontinence, genital sores, muscle weakness, suspicious skin lesions, transient blindness, difficulty walking, depression, unusual weight change, abnormal bleeding, enlarged lymph nodes, angioedema, and breast masses.  Past Medical History: Past Medical History  Diagnosis Date  . Coronary artery disease    Past Surgical History  Procedure Date  . Appendectomy     AGE 78    Medications: Prior to Admission medications   Medication Sig Start Date End Date Taking? Authorizing Provider  acetaminophen (TYLENOL) 500 MG tablet Take 1,000 mg by mouth every 6 (six) hours as needed. For pain/headache   Yes Historical Provider, MD  aspirin EC 81 MG tablet Take 81 mg by mouth at bedtime.   Yes Historical Provider, MD  cholecalciferol (VITAMIN D) 1000 UNITS tablet Take 1,000 Units by mouth daily.   Yes Historical Provider, MD  DULoxetine (CYMBALTA) 60 MG capsule Take 60 mg by mouth daily.   Yes Historical Provider, MD  insulin lispro (HUMALOG) 100 UNIT/ML injection Inject 10 Units into the skin as needed. Inject 10 units sq for blood sugar over 200  as directed.   Yes Historical Provider, MD  levETIRAcetam (KEPPRA) 500 MG tablet Take 500 mg by mouth daily.   Yes Historical Provider, MD  lisinopril (PRINIVIL,ZESTRIL) 5 MG tablet Take 5 mg by mouth daily.   Yes Historical Provider, MD  metFORMIN (GLUCOPHAGE) 1000 MG tablet Take  1,000 mg by mouth 2 (two) times daily with a meal.   Yes Historical Provider, MD  metoprolol succinate (TOPROL-XL) 50 MG 24 hr tablet Take 50 mg by mouth daily. Take with or immediately following a meal.   Yes Historical Provider, MD  OVER THE COUNTER MEDICATION Take 1 capsule by mouth at bedtime. [employer] brand sleep aid   Yes Historical Provider, MD    Allergies:  No Known Allergies  Social History:  reports that he quit smoking about 45 years ago. He has never used smokeless tobacco. He reports that he does not drink alcohol or use illicit drugs.  Family History: History reviewed. No pertinent family history.  Physical Exam: Filed Vitals:   02/22/11 1414 02/22/11 1859  BP: 127/68 143/96  Pulse: 95   Temp: 97.9 F (36.6 C)   TempSrc: Oral   Resp: 20 18  SpO2: 95% 99%   General appearance: alert, cooperative and no distress Head: Normocephalic, without obvious abnormality, atraumatic Eyes: conjunctivae/corneas clear. PERRL, EOM's intact.  Nose: Nares normal. Septum midline. Mucosa normal. No drainage or sinus tenderness. Throat: lips, mucosa, and tongue normal; teeth and gums normal Neck: no adenopathy, no carotid bruit, no JVD and thyroid not enlarged, symmetric, no tenderness/mass/nodules Resp: clear to auscultation bilaterally Cardio: regular rate and rhythm, S1, S2 normal, no murmur, click, rub or gallop GI: soft, non-tender; bowel sounds normal; no masses,  no organomegaly Extremities: extremities normal, atraumatic, no cyanosis or edema Pulses: 2+ and symmetric Lymph nodes: Cervical adenopathy: no cervical lymphadenopathy Neurologic: Alert and oriented X 3, normal strength and tone. Normal symmetric reflexes. He is confused regarding circumstances but reorients and certainly follows commands.    Labs on Admission:   Basename 02/22/11 1500  NA 124*  K 5.2*  CL 89*  CO2 15*  GLUCOSE 510*  BUN 25*  CREATININE 1.37*  CALCIUM 10.0  MG --  PHOS --    Basename  02/22/11 1500  AST 8  ALT 8  ALKPHOS 81  BILITOT 0.4  PROT 7.8  ALBUMIN 3.3*   No results found for this basename: LIPASE:2,AMYLASE:2 in the last 72 hours  Basename 02/22/11 1500  WBC 13.3*  NEUTROABS 10.8*  HGB 11.7*  HCT 34.8*  MCV 88.3  PLT 419*   No results found for this basename: CKTOTAL:3,CKMB:3,CKMBINDEX:3,TROPONINI:3 in the last 72 hours No results found for this basename: TSH,T4TOTAL,FREET3,T3FREE,THYROIDAB in the last 72 hours No results found for this basename: VITAMINB12:2,FOLATE:2,FERRITIN:2,TIBC:2,IRON:2,RETICCTPCT:2 in the last 72 hours  Radiological Exams on Admission: Dg Chest 2 View  02/22/2011  *RADIOLOGY REPORT*  Clinical Data: Cough  CHEST - 2 VIEW  Comparison: 04/10/2008  Findings: Normal heart size. Linear atelectasis at the left base. On the lateral view, at the posterior costophrenic angle, there is a 1.5 cm spiculated opacity.  Pulmonary nodule is not excluded.  No pneumothorax and no pleural effusion.  The aorta remains tortuous. Thorax is rotated to the right limiting visualization of the mediastinum.  IMPRESSION: Possible posterior basilar spiculated nodule as described.  CT chest is recommended to further characterize.  See lateral view.  Original Report Authenticated By: Donavan Burnet, M.D.   Ct Chest W Contrast  02/22/2011  *RADIOLOGY REPORT*  Clinical Data: Abnormal chest x-ray  CT CHEST WITH CONTRAST  Technique:  Multidetector CT imaging of the chest was performed following the standard protocol during bolus administration of intravenous contrast.  Contrast: 80mL OMNIPAQUE IOHEXOL 300 MG/ML IV SOLN  Comparison: 03/27/2008  Findings: The radiographic finding on the lateral image corresponds to irregular pulmonary opacities in the left lower lobe.  There is a 2.1 cm triangular opacity on image 46 as well as a 1.3 cm spiculated density, more inferior, on image 52.  These to focal densities correspond to a a number of adjacent centrilobular nodules and  peribronchial opacities.  There is associated bronchiolectasis in the left lower lobe.  Negative abnormal mediastinal adenopathy.  A aberrant right subclavian artery, a normal variation.  Coronary artery calcifications in the left anterior descending and circumflex artery.  A large hiatal hernia is noted.  Adrenal glands are unremarkable.  The bilateral renal hypodensities.  No destructive bone lesion.  Degenerative disc disease at C6-7 with posterior disc osteophytes.  IMPRESSION: The radiographic abnormality corresponds to a spiculated density at the left base.  There is an adjacent larger focal pulmonary opacity and other centrilobular nodules.  These findings may represent an inflammatory process however the findings are concerning enough to raise suspicion for malignancy.  PET CT may be helpful.  Hiatal hernia.  Original Report Authenticated By: Donavan Burnet, M.D.   Mr Brain Wo Contrast  02/10/2011  *RADIOLOGY REPORT*  Clinical Data: Memory loss.  Loss of balance.  Unsteady gait. Urinary incontinence.  Confusion.  Prior craniotomy for subdural hematoma.  MRI HEAD WITHOUT CONTRAST  Technique:  Multiplanar, multiecho pulse sequences of the brain and surrounding structures were obtained according to standard protocol without intravenous contrast.  Comparison: 12/07/2008.  Findings: Prior right frontal craniotomy for subdural hematoma treatment as per history provided.  There is minimal dural thickening in this region but without discrete subdural hematoma.  New from prior examination is what appears to be a moderate size anterior to mid right frontal lobe subacute infarct with minimal laminar necrosis. This can be reevaluated on follow-up if there are progressive symptoms.  Remote right corona radiata small infarct.  Small vessel disease type changes most notable periventricular region.  Global atrophy.  The ventricular prominence may be related atrophy although mild hydrocephalus is not excluded.  This  appearance is without significant change.  Major intracranial vascular structures are patent with atherosclerotic type changes left vertebral artery.  Banding of the globes.  Mild exophthalmos on the right unchanged.  Mild paranasal sinus mucosal thickening.  IMPRESSION: New from prior examination is what appears to be a moderate size anterior to mid right frontal lobe subacute infarct with minimal laminar necrosis.  Global atrophy.  The ventricular prominence may be related atrophy although mild hydrocephalus is not excluded.  This appearance is without significant change.  Mild paranasal sinus mucosal thickening.  Original Report Authenticated By: Fuller Canada, M.D.   No orders found for this or any previous visit.  Assessment  #1 diabetic nonketotic hyperosmolar state secondary to poor diabetic compliance  #2 hyponatremia and acute renal failure secondary to volume depletion and #1   #3 diabetes mellitus type 2 poorly controlled secondary to noncompliance   #4 memory loss multifactorial with remote bleed, seizure disorder and frontal infarct  #5 new left lung mass. Coincidental certainly not contributing to the current issues are the workup pending his course here  #6 essential hypertension  Plan admit for hydration, insulin, PT and OT. Issues  regarding the long probably followup noninvasively initially with a PET scan per their recommendations.   Ardis Fullwood A 02/22/2011, 7:32 PM

## 2011-02-22 NOTE — ED Notes (Signed)
RN stated to CSW that pt's PCP Dr. Jacky Kindle had requested a CSW consult. CSW met with pt who was accompanied by his wife [name] Walker and son, [name] Walker [phone]). Pt resides in his home with his wife and son. CSW explored with the pt difficulties he has been having with his medications. Pt states that he has been taking his 17 medications as prescribed and using his "insulin 1x daily at bedtime." Pt repeated himself multiple times that he only takes insulin 1x daily, though later contradicted himself stating that his insulin medication had recently changed and he is now supposed to take the medication 2x daily at morning and night. Pt appeared confused and unable to confirm when exactly the medication is taken. Pt then states "I take my medication whenever I wake up".   Per wife, pt has always been able to care for himself and when she has tried to help with the medication, the pt refuses the medications from her. Per son, he has tried to work with the pt on checking his blood sugar and taking his insulin as prescribed. Both wife and son state that the pt has had increased confusion since Thanksgiving 2012, reporting that the pt has been incontinent at times, confusing the times of day and wandering around outside the home at 1am. Family reports no known family history of Dementia. Family states they are unclear as to whether the confusion is due to the blood sugar levels or if there is "something else medically wrong".   Pt states that there is a chart in the home as well as a pill box to help him keep track of his medications. At this time there are no home health services in the home. Pt and family are both agreeable to the possibility of having services assist within the home.  CSW contacted the on call CM, Bonita Quin, to discuss possible home health services/disease management services available. Bonita Quin stated that CSW was to call [facility] [phone]) for disease management assistance. Per [facility], the  patient does not have an active contract and therefore can not receive services through them.   Per EDP Adriana Simas, pt will be reviewed for possible admission. Consult to CSW again if changes are made to care plan.

## 2011-02-22 NOTE — ED Notes (Signed)
CSW met with pt and family again to further discuss plans for when the pt is ready for discharge. [name] Walker (son) stated that he feels that he and pt's wife, [name] Walker, can care for the pt 24hrs per day once the pt's sugar levels are stable and there is clarification from the medical staff as to why the pt suddenly started having confusion. Pt and family agree to consider SNF placement if the pt is not able to stabilize with the diabetes and confusion. Per wife, she is the pt's HPOA. Family state that if placement is necessary, they would like [facility] to be considered first. Family added that pt has been to [facility] in the past after a brain surgery 56yrs ago, and pt was unhappy there. CSW will continue to consult as necessary.

## 2011-02-23 DIAGNOSIS — R413 Other amnesia: Secondary | ICD-10-CM

## 2011-02-23 DIAGNOSIS — E1100 Type 2 diabetes mellitus with hyperosmolarity without nonketotic hyperglycemic-hyperosmolar coma (NKHHC): Secondary | ICD-10-CM

## 2011-02-23 DIAGNOSIS — I639 Cerebral infarction, unspecified: Secondary | ICD-10-CM

## 2011-02-23 LAB — GLUCOSE, CAPILLARY
Glucose-Capillary: 356 mg/dL — ABNORMAL HIGH (ref 70–99)
Glucose-Capillary: 195 mg/dL — ABNORMAL HIGH (ref 70–99)
Glucose-Capillary: 390 mg/dL — ABNORMAL HIGH (ref 70–99)

## 2011-02-23 LAB — BASIC METABOLIC PANEL
BUN: 20 mg/dL (ref 6–23)
Calcium: 8.8 mg/dL (ref 8.4–10.5)
Chloride: 98 mEq/L (ref 96–112)
Creatinine, Ser: 1.14 mg/dL (ref 0.50–1.35)
GFR calc Af Amer: 70 mL/min — ABNORMAL LOW (ref >90)

## 2011-02-23 LAB — CBC
HCT: 28.7 % — ABNORMAL LOW (ref 39.0–52.0)
MCHC: 34.8 g/dL (ref 30.0–36.0)
MCV: 87 fL (ref 78.0–100.0)
Platelets: 323 10*3/uL (ref 150–400)
RDW: 13.2 % (ref 11.5–15.5)
WBC: 6.9 10*3/uL (ref 4.0–10.5)

## 2011-02-23 LAB — IRON AND TIBC
Saturation Ratios: 20 % (ref 20–55)
TIBC: 201 ug/dL — ABNORMAL LOW (ref 215–435)
UIBC: 160 ug/dL (ref 125–400)

## 2011-02-23 MED ORDER — DIPHENHYDRAMINE HCL 25 MG PO CAPS
25.0000 mg | ORAL_CAPSULE | Freq: Every evening | ORAL | Status: AC | PRN
  Administered 2011-02-23: 25 mg via ORAL
  Filled 2011-02-23: qty 1

## 2011-02-23 NOTE — Progress Notes (Signed)
Subjective: [name] Walker looks great  and is doing quite well. He is sitting up eating and reading. His only complaint was a lack of sleep last night as he was not able to take his over-the-counter sleeper. There is a big reality break in his insulin compliance as he maintains a been taking it and that his wife is basically lying. As I have outlined nicely in my admission, not only is he not taking it but his wife and son will not in divorce this nor will they consistently give the insulin or monitor his blood sugars. They dismiss this that this will improve once his blood sugars are better and he'll be more compliant. I have long maintained his poor compliance led to this despite multiple visits where I in forced this and educated this. This is a no win circumstances will only worsen at home once again. Again my order to social services is such that he needs placement, Adult Protective Services and anything less will result in readmission and decline.  Objective: Vital signs in last 24 hours: Temp:  [97.9 F (36.6 C)-98.6 F (37 C)] 98.6 F (37 C) (01/23 0530) Pulse Rate:  [79-99] 79  (01/23 0530) Resp:  [16-20] 16  (01/23 0530) BP: (111-143)/(68-96) 111/68 mmHg (01/23 0530) SpO2:  [95 %-99 %] 96 % (01/23 0530) Weight:  [66.225 kg (146 lb)] 66.225 kg (146 lb) (01/22 2115) Weight change:   CBG (last 3)   Basename 02/22/11 2124 02/22/11 1858 02/22/11 1606  GLUCAP 380* 388* 525*    Intake/Output from previous day: 01/22 0701 - 01/23 0700 In: 1557.9 [P.O.:360; I.V.:1197.9] Out: 850 [Urine:850]  Physical Exam: Patient is awake alert no distress. He converses follows commands is somewhat appropriate but clearly not on prior regarding circumstances. He has good facial symmetry no gaze deviation. Neck is supple nontender no bruits. Lungs are grossly clear to auscultation and percussion. Cardiovascular exam regular rate and rhythm no obvious murmur. Abdomen is soft nontender bowel sounds normal. He is no  peripheral edema. Cranial nerves are intact motor exam is nonlateralizing.   Lab Results:  Basename 02/23/11 0510 02/22/11 1500  NA 127* 124*  K 4.4 5.2*  CL 98 89*  CO2 19 15*  GLUCOSE 397* 510*  BUN 20 25*  CREATININE 1.14 1.37*  CALCIUM 8.8 10.0  MG -- --  PHOS -- --    Basename 02/22/11 1500  AST 8  ALT 8  ALKPHOS 81  BILITOT 0.4  PROT 7.8  ALBUMIN 3.3*    Basename 02/23/11 0510 02/22/11 1500  WBC 6.9 13.3*  NEUTROABS -- 10.8*  HGB 10.0* 11.7*  HCT 28.7* 34.8*  MCV 87.0 88.3  PLT 323 419*   Lab Results  Component Value Date   INR 1.07 12/04/2008   INR 1.0 03/26/2008   No results found for this basename: CKTOTAL:3,CKMB:3,CKMBINDEX:3,TROPONINI:3 in the last 72 hours No results found for this basename: TSH,T4TOTAL,FREET3,T3FREE,THYROIDAB in the last 72 hours No results found for this basename: VITAMINB12:2,FOLATE:2,FERRITIN:2,TIBC:2,IRON:2,RETICCTPCT:2 in the last 72 hours  Studies/Results: Dg Chest 2 View  02/22/2011  *RADIOLOGY REPORT*  Clinical Data: Cough  CHEST - 2 VIEW  Comparison: 04/10/2008  Findings: Normal heart size. Linear atelectasis at the left base. On the lateral view, at the posterior costophrenic angle, there is a 1.5 cm spiculated opacity.  Pulmonary nodule is not excluded.  No pneumothorax and no pleural effusion.  The aorta remains tortuous. Thorax is rotated to the right limiting visualization of the mediastinum.  IMPRESSION: Possible posterior basilar  spiculated nodule as described.  CT chest is recommended to further characterize.  See lateral view.  Original Report Authenticated By: Donavan Burnet, M.D.   Ct Chest W Contrast  02/22/2011  *RADIOLOGY REPORT*  Clinical Data: Abnormal chest x-ray  CT CHEST WITH CONTRAST  Technique:  Multidetector CT imaging of the chest was performed following the standard protocol during bolus administration of intravenous contrast.  Contrast: 80mL OMNIPAQUE IOHEXOL 300 MG/ML IV SOLN  Comparison: 03/27/2008   Findings: The radiographic finding on the lateral image corresponds to irregular pulmonary opacities in the left lower lobe.  There is a 2.1 cm triangular opacity on image 46 as well as a 1.3 cm spiculated density, more inferior, on image 52.  These to focal densities correspond to a a number of adjacent centrilobular nodules and peribronchial opacities.  There is associated bronchiolectasis in the left lower lobe.  Negative abnormal mediastinal adenopathy.  A aberrant right subclavian artery, a normal variation.  Coronary artery calcifications in the left anterior descending and circumflex artery.  A large hiatal hernia is noted.  Adrenal glands are unremarkable.  The bilateral renal hypodensities.  No destructive bone lesion.  Degenerative disc disease at C6-7 with posterior disc osteophytes.  IMPRESSION: The radiographic abnormality corresponds to a spiculated density at the left base.  There is an adjacent larger focal pulmonary opacity and other centrilobular nodules.  These findings may represent an inflammatory process however the findings are concerning enough to raise suspicion for malignancy.  PET CT may be helpful.  Hiatal hernia.  Original Report Authenticated By: Donavan Burnet, M.D.     Assessment/Plan: #1 diabetic nonketotic hyperosmolar state responding to subcutaneous insulin in the initiation of Lantus last evening morning blood sugars pending at this time  #2 diabetes mellitus type 2 insulin-dependent is poor control largely because of compliance, memory disorder and lack of home support  #3 metabolic-hyponatremia and acute renal failure with volume depletion much improved but not to baseline  #4 frontal lobe stroke subacute/chronic on aspirin but has not been taking at home with poor compliance of all meds. Will get therapy consults and check carotids  #5 questionable pulmonary mass PET scanning pending  #6 seizure disorder with remote CNS bleed  #7 coronary artery disease with  prior stent   LOS: 1 day   [name] Walker A 02/23/2011, 6:49 AM

## 2011-02-23 NOTE — Progress Notes (Signed)
Bilateral carotid artery duplex completed.  Preliminary report is no evidence of significant ICA stenosis.  S. Adelene Polivka, RVT 

## 2011-02-23 NOTE — Evaluation (Signed)
Physical Therapy Evaluation Patient Details Name: [name] Walker MRN: [id number] DOB: 14-Apr-1931 Today's Date: 02/23/2011  Problem List:  Patient Active Problem List  Diagnoses  . DIABETES MELLITUS, TYPE II, ON INSULIN  . ANEMIA  . ALLERGIC RHINITIS, SEASONAL  . ASTHMA  . COPD  . ESOPHAGEAL STRICTURE  . HIATAL HERNIA  . DIVERTICULOSIS, COLON  . URINARY INCONTINENCE  . Diabetic hyperosmolar non-ketotic state  . Memory loss  . CVA (cerebral infarction)    Past Medical History:  Past Medical History  Diagnosis Date  . Coronary artery disease   . Diabetes mellitus   . Hypertension   . Hyperlipidemia   . Headache   . Seizure   . Arthritis   . Anemia   . Blind left eye   . Subdural hematoma     hx recurrent right subdural hematoma   Past Surgical History:  Past Surgical History  Procedure Date  . Appendectomy     AGE 11  . Rhino-septoplasty 1976  . Knee arthroscopy 1981    left knee  . Retinal detachment surgery 1989    left  . Cataract extraction 1995    right eye  . Knee arthroscopy 1998    right knee  . Heart catherization 2001  . Knee surgery 2003    revision of left knee  . Penile prosthesis implant 2005  . Shoulder arthroscopy 2005, 2006    right shoulder  . Knee surgery 1998    left knee lateral release    PT Assessment/Plan/Recommendation PT Assessment Clinical Impression Statement: Patient admitted with hyperglycemia presents with decreased balance (at high risk for falls per BERG balance assessment), decreased awareness of deficits and will benefit from skilled PT in acute setting to improve safety and independence.  May benefit from PT in SNF setting (as this has been recommended by MD due to medication noncompliance.) PT Recommendation/Assessment: Patient will need skilled PT in the acute care venue PT Problem List: Decreased balance;Decreased safety awareness;Decreased knowledge of use of DME PT Therapy Diagnosis : Abnormality of gait PT  Plan PT Frequency: Min 3X/week PT Treatment/Interventions: Gait training;DME instruction;Stair training;Functional mobility training;Therapeutic activities;Balance training;Patient/family education PT Recommendation Follow Up Recommendations: Skilled nursing facility Equipment Recommended: Defer to next venue PT Goals  Acute Rehab PT Goals PT Goal Formulation: With patient Time For Goal Achievement: 2 weeks Pt will go Sit to Stand: Independently PT Goal: Sit to Stand - Progress: Goal set today Pt will go Stand to Sit: Independently PT Goal: Stand to Sit - Progress: Goal set today Pt will Ambulate: >150 feet;with modified independence;with least restrictive assistive device PT Goal: Ambulate - Progress: Goal set today Additional Goals Additional Goal #1: Patient will demonstrate decreased fall risk with BERG score >/=45/56 (38/56 on initial eval). PT Goal: Additional Goal #1 - Progress: Goal set today  PT Evaluation Precautions/Restrictions  Precautions Precautions: Fall Restrictions Weight Bearing Restrictions: No Prior Functioning  Home Living Lives With: Spouse Type of Home: House Home Layout: One level Home Access: Stairs to enter Entrance Stairs-Rails: None Entrance Stairs-Number of Steps: 6 Home Adaptive Equipment: None Prior Function Level of Independence: Independent with basic ADLs;Independent with gait;Independent with transfers Driving: Yes Cognition Cognition Arousal/Alertness: Awake/alert Orientation Level: Oriented X4 Sensation/Coordination   Extremity Assessment RLE Assessment RLE Assessment: Within Functional Limits LLE Assessment LLE Assessment: Within Functional Limits Mobility (including Balance) Bed Mobility Bed Mobility: Yes Supine to Sit: 7: Independent Sitting - Scoot to Edge of Bed: 6: Modified independent (Device/Increase time) Transfers Transfers: Yes  Sit to Stand: 5: Supervision Sit to Stand Details (indicate cue type and reason): for  safety Stand to Sit: 5: Supervision Stand to Sit Details: for safety Ambulation/Gait Ambulation/Gait: Yes Ambulation/Gait Assistance: 5: Supervision Ambulation/Gait Assistance Details (indicate cue type and reason): close supervision due to patient looking all around and with multiple imbalances with lateral stepping to self recover. Ambulation Distance (Feet): 220 Feet Assistive device: None Gait Pattern: Trunk rotated posteriorly on right  Posture/Postural Control Posture/Postural Control: Postural limitations Postural Limitations: increased trunk extension, rotated right Balance Balance Assessed: Yes Berg Balance Test Sit to Stand: Able to stand without using hands and stabilize independently Standing Unsupported: Able to stand safely 2 minutes Sitting with Back Unsupported but Feet Supported on Floor or Stool: Able to sit safely and securely 2 minutes Stand to Sit: Sits independently, has uncontrolled descent Transfers: Able to transfer with verbal cueing and /or supervision Standing Unsupported with Eyes Closed: Able to stand 10 seconds safely Standing Ubsupported with Feet Together: Able to place feet together independently and stand for 1 minute with supervision From Standing, Reach Forward with Outstretched Arm: Can reach confidently >25 cm (10") From Standing Position, Pick up Object from Floor: Able to pick up shoe safely and easily From Standing Position, Turn to Look Behind Over each Shoulder: Looks behind one side only/other side shows less weight shift Turn 360 Degrees: Needs assistance while turning (LOB turning to left) Standing Unsupported, Alternately Place Feet on Step/Stool: Able to complete >2 steps/needs minimal assist Standing Unsupported, One Foot in Front: Able to plae foot ahead of the other independently and hold 30 seconds Standing on One Leg: Tries to lift leg/unable to hold 3 seconds but remains standing independently Total Score: 38  Exercise    End of  Session PT - End of Session Equipment Utilized During Treatment: Gait belt Activity Tolerance: Patient tolerated treatment well Patient left: in chair;with call bell in reach Nurse Communication:  (need for assist with mobility due to fall risk) General Behavior During Session: [facility] for tasks performed Cognition: [facility] for tasks performed  [facility] 02/23/2011, 12:26 PM

## 2011-02-23 NOTE — Evaluation (Signed)
Occupational Therapy Evaluation Patient Details Name: [name] Walker MRN: [id number] DOB: Aug 23, 1931 Today's Date: 02/23/2011  Problem List:  Patient Active Problem List  Diagnoses  . DIABETES MELLITUS, TYPE II, ON INSULIN  . ANEMIA  . ALLERGIC RHINITIS, SEASONAL  . ASTHMA  . COPD  . ESOPHAGEAL STRICTURE  . HIATAL HERNIA  . DIVERTICULOSIS, COLON  . URINARY INCONTINENCE  . Diabetic hyperosmolar non-ketotic state  . Memory loss  . CVA (cerebral infarction)    Past Medical History:  Past Medical History  Diagnosis Date  . Coronary artery disease   . Diabetes mellitus   . Hypertension   . Hyperlipidemia   . Headache   . Seizure   . Arthritis   . Anemia   . Blind left eye   . Subdural hematoma     hx recurrent right subdural hematoma   Past Surgical History:  Past Surgical History  Procedure Date  . Appendectomy     AGE 11  . Rhino-septoplasty 1976  . Knee arthroscopy 1981    left knee  . Retinal detachment surgery 1989    left  . Cataract extraction 1995    right eye  . Knee arthroscopy 1998    right knee  . Heart catherization 2001  . Knee surgery 2003    revision of left knee  . Penile prosthesis implant 2005  . Shoulder arthroscopy 2005, 2006    right shoulder  . Knee surgery 1998    left knee lateral release    OT Assessment/Plan/Recommendation OT Assessment Clinical Impression Statement: Pt currently Mod I - supervision level for ADL's & selfcare tasks. No family present during eval, pt reports that he plans to return home after acute stay. Will follow acutely, ?HHOT depending on pt progress. OT Recommendation/Assessment: Patient will need skilled OT in the acute care venue OT Problem List: Decreased knowledge of use of DME or AE;Decreased activity tolerance Barriers to Discharge:  (per chart review, decreased caregiver support) OT Therapy Diagnosis : Generalized weakness OT Plan OT Frequency: Min 1X/week OT Treatment/Interventions:  Self-care/ADL training;Therapeutic activities;DME and/or AE instruction;Patient/family education OT Recommendation Follow Up Recommendations: Home health OT Equipment Recommended: None recommended by OT OT Goals Acute Rehab OT Goals OT Goal Formulation: With patient Time For Goal Achievement: 7 days ADL Goals Pt Will Perform Grooming: with modified independence;Standing at sink;Sitting at sink;Unsupported ADL Goal: Grooming - Progress: Goal set today Pt Will Perform Upper Body Bathing: with modified independence;Sitting, chair;Sitting at sink;Sitting, edge of bed;Unsupported ADL Goal: Upper Body Bathing - Progress: Goal set today Pt Will Perform Lower Body Bathing: with modified independence;Sitting, chair;Sitting, edge of bed;Sitting at sink;Unsupported ADL Goal: Lower Body Bathing - Progress: Goal set today Pt Will Perform Upper Body Dressing: with modified independence;Sitting, bed;Sitting, chair ADL Goal: Upper Body Dressing - Progress: Goal set today Pt Will Perform Lower Body Dressing: with modified independence;Sit to stand from bed;Sit to stand from chair ADL Goal: Lower Body Dressing - Progress: Goal set today Pt Will Transfer to Toilet: with modified independence;Ambulation;with DME ADL Goal: Toilet Transfer - Progress: Goal set today Pt Will Perform Toileting - Clothing Manipulation: with modified independence ADL Goal: Toileting - Clothing Manipulation - Progress: Goal set today Pt Will Perform Toileting - Hygiene: Independently;Sit to stand from 3-in-1/toilet ADL Goal: Toileting - Hygiene - Progress: Goal set today Pt Will Perform Tub/Shower Transfer: Tub transfer;with modified independence;with caregiver independent in assisting;Ambulation;with DME ADL Goal: Tub/Shower Transfer - Progress: Goal set today  OT Evaluation Precautions/Restrictions  Precautions Precautions:  Fall Restrictions Weight Bearing Restrictions: No Prior Functioning Home Living Lives With:  Spouse Type of Home: House Home Layout: One level Home Access: Stairs to enter Entrance Stairs-Rails: None Entrance Stairs-Number of Steps: 6 Home Adaptive Equipment: None Prior Function Level of Independence: Independent with basic ADLs;Independent with gait;Independent with transfers Driving: Yes ADL ADL Eating/Feeding: Performed;Independent Where Assessed - Eating/Feeding: Bed level Grooming: Performed;Supervision/safety Where Assessed - Grooming: Standing at sink Upper Body Bathing: Modified independent;Simulated Where Assessed - Upper Body Bathing: Sitting, bed Lower Body Bathing: Simulated;Modified independent Where Assessed - Lower Body Bathing: Sitting, bed Upper Body Dressing: Performed;Modified independent Where Assessed - Upper Body Dressing: Sitting, bed;Unsupported Lower Body Dressing: Performed;Modified independent Where Assessed - Lower Body Dressing: Sitting, bed;Sit to stand from bed Toilet Transfer: Performed;Supervision/safety Toilet Transfer Method: Proofreader: Comfort height toilet;Other (comment) (pt did not use grab bars) Toileting - Clothing Manipulation: Performed;Supervision/safety Where Assessed - Toileting Clothing Manipulation: Sit on 3-in-1 or toilet Toileting - Hygiene: Simulated;Modified independent Where Assessed - Toileting Hygiene: Sit on 3-in-1 or toilet Tub/Shower Transfer: Not assessed Equipment Used: Other (comment) (gait belt) ADL Comments: Pt currently Mod I - supervision level for ADL's & selfcare tasks. No family present during eval, pt reports that he plans to return home after acute stay. Will follow acutely, ?HHOT depending on pt progress. Vision/Perception  Vision - History Baseline Vision: Wears glasses all the time Patient Visual Report: No change from baseline Cognition Cognition Arousal/Alertness: Awake/alert Orientation Level: Oriented X4 Sensation/Coordination Sensation Light Touch: Appears  Intact Coordination Gross Motor Movements are Fluid and Coordinated: Yes Fine Motor Movements are Fluid and Coordinated: Yes Extremity Assessment RUE Assessment RUE Assessment: Within Functional Limits LUE Assessment LUE Assessment: Within Functional Limits Mobility  Bed Mobility Bed Mobility: Yes Supine to Sit: 7: Independent Sitting - Scoot to Edge of Bed: 6: Modified independent (Device/Increase time) Transfers Sit to Stand: 5: Supervision Sit to Stand Details (indicate cue type and reason): for safety Stand to Sit: 5: Supervision Stand to Sit Details: VC for safety   End of Session OT - End of Session Equipment Utilized During Treatment: Gait belt Activity Tolerance: Patient tolerated treatment well Patient left: in bed;with call bell in reach;with bed alarm set General Behavior During Session: [facility] for tasks performed Cognition: [facility] for tasks performed   Mordechai Matuszak Beth Dixon 02/23/2011, 1:12 PM

## 2011-02-23 NOTE — Progress Notes (Signed)
CSW has completed FL-2 and faxed pt out to [facility]. CSW will follow-up with the pt and family concerning bed offers. CSW will continue to reinterate that the recommendation is SNF and that if the pt/family decide that the pt will return home, APS will be notified. CSW will continue to follow the pt and offer support.  [name], Walker, LCSWA 02/23/2011 1:25 PM [phone]

## 2011-02-24 ENCOUNTER — Encounter (HOSPITAL_COMMUNITY): Payer: Self-pay | Admitting: Radiology

## 2011-02-24 ENCOUNTER — Inpatient Hospital Stay (HOSPITAL_COMMUNITY): Payer: Medicare Other

## 2011-02-24 LAB — CBC
HCT: 29.7 % — ABNORMAL LOW (ref 39.0–52.0)
MCHC: 34 g/dL (ref 30.0–36.0)
Platelets: 335 10*3/uL (ref 150–400)
RDW: 13.5 % (ref 11.5–15.5)
WBC: 5.6 10*3/uL (ref 4.0–10.5)

## 2011-02-24 LAB — GLUCOSE, RANDOM: Glucose, Bld: 488 mg/dL — ABNORMAL HIGH (ref 70–99)

## 2011-02-24 LAB — BASIC METABOLIC PANEL
BUN: 12 mg/dL (ref 6–23)
Calcium: 8.8 mg/dL (ref 8.4–10.5)
Creatinine, Ser: 0.99 mg/dL (ref 0.50–1.35)
GFR calc Af Amer: 89 mL/min — ABNORMAL LOW (ref >90)
GFR calc non Af Amer: 77 mL/min — ABNORMAL LOW (ref >90)

## 2011-02-24 LAB — GLUCOSE, CAPILLARY
Glucose-Capillary: 210 mg/dL — ABNORMAL HIGH (ref 70–99)
Glucose-Capillary: 422 mg/dL — ABNORMAL HIGH (ref 70–99)

## 2011-02-24 MED ORDER — INSULIN ASPART 100 UNIT/ML ~~LOC~~ SOLN
20.0000 [IU] | Freq: Once | SUBCUTANEOUS | Status: AC
  Administered 2011-02-24: 20 [IU] via SUBCUTANEOUS

## 2011-02-24 MED ORDER — INSULIN ASPART 100 UNIT/ML ~~LOC~~ SOLN
0.0000 [IU] | Freq: Three times a day (TID) | SUBCUTANEOUS | Status: DC
  Administered 2011-02-24: 15 [IU] via SUBCUTANEOUS
  Administered 2011-02-25: 3 [IU] via SUBCUTANEOUS
  Administered 2011-02-25: 5 [IU] via SUBCUTANEOUS
  Administered 2011-02-25 – 2011-02-26 (×2): 15 [IU] via SUBCUTANEOUS
  Administered 2011-02-26: 11 [IU] via SUBCUTANEOUS
  Administered 2011-02-26: 15 [IU] via SUBCUTANEOUS
  Administered 2011-02-27: 8 [IU] via SUBCUTANEOUS
  Administered 2011-02-27: 15 [IU] via SUBCUTANEOUS
  Administered 2011-02-27: 5 [IU] via SUBCUTANEOUS
  Administered 2011-02-28: 8 [IU] via SUBCUTANEOUS
  Administered 2011-02-28: 5 [IU] via SUBCUTANEOUS

## 2011-02-24 MED ORDER — DIPHENHYDRAMINE HCL 25 MG PO CAPS
25.0000 mg | ORAL_CAPSULE | Freq: Every evening | ORAL | Status: AC | PRN
  Administered 2011-02-24: 25 mg via ORAL
  Filled 2011-02-24: qty 1

## 2011-02-24 MED ORDER — FLUDEOXYGLUCOSE F - 18 (FDG) INJECTION
18.1000 | Freq: Once | INTRAVENOUS | Status: AC | PRN
  Administered 2011-02-24: 18.1 via INTRAVENOUS

## 2011-02-24 MED ORDER — INSULIN GLARGINE 100 UNIT/ML ~~LOC~~ SOLN
25.0000 [IU] | Freq: Every day | SUBCUTANEOUS | Status: DC
  Administered 2011-02-24: 25 [IU] via SUBCUTANEOUS

## 2011-02-24 MED ORDER — INSULIN ASPART 100 UNIT/ML ~~LOC~~ SOLN
0.0000 [IU] | Freq: Once | SUBCUTANEOUS | Status: DC
  Filled 2011-02-24: qty 3

## 2011-02-24 NOTE — Progress Notes (Signed)
02/24/2011... 9:44 PM... CBG @ 2100 = 422... According to moderate SSI, call MD... Per MD, do not draw stat blood glucose to verify... Orders rec'd from Dr Timothy Lasso to give 20 units Novolog plus scheduled Lantus, and recheck CBG in 2 hrs... If CBG <350, no further insulin, if CBG >350, call MD... RN Marvia Pickles

## 2011-02-24 NOTE — Progress Notes (Signed)
02/24/2011... 8:49 PM... Pt voices anxiety and frustration; pt states he doesn't know what he's doing here, says no one has shared any information with him about the plan of care, reason for hospital stay, test results, or why his blood sugars remain elevated... Took time to discuss plan of care with pt as best this RN could, but pt still appeared frustrated... Pt further states he hasn't gotten to talk to the doctor... Explored pt's concerns and asked him if he's expressed these concerns to the physician during daily rounds... Encouraged pt to speak up during the next daily MD round and let the doctor know that he has questions about the plan of care, PET scan results, and medical questions... Pt also adament that he wants bed unplugged so that the motor doesn't keep coming on- says this keeps him awake and significantly bothers him... Instructed pt to call for assistance before attempting to get OOB... Pt asked why he needed to call for assistance, says he doesn't need help... Educated pt on fall risk, especially given that pt has requested sleep med for insomnia... Pt says he doesn't want to have to wait for assistance to arrive; Informed pt this RN is sitting right outside pt's room, so either RN or NT would be readily available; confirmed that call bell on side rail is functioning, and portable call bell is within reach; pt agreed to call for assistance before attempting to get OOB;  unable to use bed alarm because pt refuses to have bed plugged in.... Continue to monitor closely... Marvia Pickles, RN

## 2011-02-24 NOTE — Progress Notes (Signed)
CRITICAL VALUE ALERT  Critical value received:  Serum glucose 488  Date of notification:  02/24/2011  Time of notification:  1840  Critical value read back:yes  Nurse who received alert: Lowella Petties, RN  MD notified (1st page):  Dr. Timothy Lasso  Time of first page:  1840  MD notified (2nd page):  Time of second page:  Responding MD:  Dr. Timothy Lasso  Time MD responded:  [phone]

## 2011-02-24 NOTE — Progress Notes (Signed)
CSW spoke with the pts wife on the phone and gave bed offers. Pt was not present in room, he was away having a procedure.  The pts wife reports her first choice would be [facility] which is close to her home. CSW spoke with Paula Compton at [facility] and she reports they have not viewed the pts clinicals yet and even if she makes a bed offer they can not admit the pt until Monday, CSW has explained this to the pts wife and explained that the the family needs to come up with a back up plan just in case the pt discharges before Monday, and we do not know for sure if [facility] will make a bed offer.  Pts wife 2nd choice is [facility], CSW called and spoke with Paula Compton who reports she can not give a decision until later this evening or in the morning because the DON is away at a conference. CSW has explained this to the pts wife as well. The pt's wife questioned if she could just take the pt home if he discharges before a bed is a bed is available at [facility], CSW explained that their is no promise that [facility] will make a bed offer and if the pt goes home, the CSW will have to notify Adult Protective Services. CSW spent a length of time explaining APS. Pts wife polite, after back and forth discussion and questioning and answering CSW encouraged pts wife to make a decision based off bed offers given just in case the pt discharges tomorrow. She reports she will wait to hear from Dr. Jacky Kindle tomorrow and eventually stated that [facility] would be her choice is [facility] or [facility] do not make a bed offer. CSW will continue to follow the pt and offer support.  LAYDON, MARTIS, LCSWA 02/24/2011 11:17 AM [phone]

## 2011-02-24 NOTE — Progress Notes (Signed)
Dr. Timothy Lasso notified of elevated serum glucose.  New orders received. Will monitor.

## 2011-02-24 NOTE — Progress Notes (Signed)
Subjective: Patient is awake reiterated he needs bed changes he is wet. He has no complaints. He is confused regarding circumstances but pleasant and interactive.  Objective: Vital signs in last 24 hours: Temp:  [97.4 F (36.3 C)-98.2 F (36.8 C)] 98.1 F (36.7 C) (01/24 0625) Pulse Rate:  [65-79] 65  (01/24 0625) Resp:  [18-20] 18  (01/24 0625) BP: (95-137)/(56-76) 122/74 mmHg (01/24 0625) SpO2:  [97 %-98 %] 98 % (01/24 0625) Weight change:   CBG (last 3)   Basename 02/24/11 0655 02/23/11 2155 02/23/11 1641  GLUCAP 209* 420* 390*    Intake/Output from previous day: 01/23 0701 - 01/24 0700 In: 2544.2 [P.O.:240; I.V.:2304.2] Out: 1250 [Urine:1250]  Physical Exam:  Patient is awake alert no distress. He converses follows commands is somewhat appropriate but clearly not on prior regarding circumstances. He has good facial symmetry no gaze deviation. Neck is supple nontender no bruits. Lungs are grossly clear to auscultation and percussion. Cardiovascular exam regular rate and rhythm no obvious murmur. Abdomen is soft nontender bowel sounds normal. He is no peripheral edema. Cranial nerves are intact motor exam is nonlateralizing.     Lab Results:  [facility] 02/24/11 0511 02/23/11 0510  NA 136 127*  K 4.5 4.4  CL 108 98  CO2 20 19  GLUCOSE 237* 397*  BUN 12 20  CREATININE 0.99 1.14  CALCIUM 8.8 8.8  MG -- --  PHOS -- --    Basename 02/22/11 1500  AST 8  ALT 8  ALKPHOS 81  BILITOT 0.4  PROT 7.8  ALBUMIN 3.3*    Basename 02/24/11 0511 02/23/11 0510 02/22/11 1500  WBC 5.6 6.9 --  NEUTROABS -- -- 10.8*  HGB 10.1* 10.0* --  HCT 29.7* 28.7* --  MCV 87.6 87.0 --  PLT 335 323 --   Lab Results  Component Value Date   INR 1.07 12/04/2008   INR 1.0 03/26/2008   No results found for this basename: CKTOTAL:3,CKMB:3,CKMBINDEX:3,TROPONINI:3 in the last 72 hours  Basename 02/23/11 0740  TSH 1.341  T4TOTAL --  T3FREE --  THYROIDAB --    Basename 02/23/11 0740    VITAMINB12 944*  FOLATE --  FERRITIN 210  TIBC 201*  IRON 41*  RETICCTPCT --    Studies/Results: Dg Chest 2 View  02/22/2011  *RADIOLOGY REPORT*  Clinical Data: Cough  CHEST - 2 VIEW  Comparison: 04/10/2008  Findings: Normal heart size. Linear atelectasis at the left base. On the lateral view, at the posterior costophrenic angle, there is a 1.5 cm spiculated opacity.  Pulmonary nodule is not excluded.  No pneumothorax and no pleural effusion.  The aorta remains tortuous. Thorax is rotated to the right limiting visualization of the mediastinum.  IMPRESSION: Possible posterior basilar spiculated nodule as described.  CT chest is recommended to further characterize.  See lateral view.  Original Report Authenticated By: Donavan Burnet, M.D.   Ct Chest W Contrast  02/22/2011  *RADIOLOGY REPORT*  Clinical Data: Abnormal chest x-ray  CT CHEST WITH CONTRAST  Technique:  Multidetector CT imaging of the chest was performed following the standard protocol during bolus administration of intravenous contrast.  Contrast: 80mL OMNIPAQUE IOHEXOL 300 MG/ML IV SOLN  Comparison: 03/27/2008  Findings: The radiographic finding on the lateral image corresponds to irregular pulmonary opacities in the left lower lobe.  There is a 2.1 cm triangular opacity on image 46 as well as a 1.3 cm spiculated density, more inferior, on image 52.  These to focal densities correspond to a  a number of adjacent centrilobular nodules and peribronchial opacities.  There is associated bronchiolectasis in the left lower lobe.  Negative abnormal mediastinal adenopathy.  A aberrant right subclavian artery, a normal variation.  Coronary artery calcifications in the left anterior descending and circumflex artery.  A large hiatal hernia is noted.  Adrenal glands are unremarkable.  The bilateral renal hypodensities.  No destructive bone lesion.  Degenerative disc disease at C6-7 with posterior disc osteophytes.  IMPRESSION: The radiographic  abnormality corresponds to a spiculated density at the left base.  There is an adjacent larger focal pulmonary opacity and other centrilobular nodules.  These findings may represent an inflammatory process however the findings are concerning enough to raise suspicion for malignancy.  PET CT may be helpful.  Hiatal hernia.  Original Report Authenticated By: Donavan Burnet, M.D.     Assessment/Plan: #1 diabetic nonketotic hyperosmolar state resolved  #2 diabetes mellitus type 2 insulin-dependent compliant to big issue at home will adjust Lantus  #3 metabolic/hyponatremia/acute renal failure resolved  #4 frontal lobe stroke on aspirin  #5 pulmonary mass PET scanning pending  #6 seizure disorder with remote CNS bleed  #7 coronary artery disease with prior stent  Plan we will discontinue IV fluids, await skilled nursing and await the PET scan as ordered to evaluate the asymptomatic pulmonary lesion.   LOS: 2 days   Samanatha Brammer A 02/24/2011, 6:58 AM

## 2011-02-25 LAB — CBC
HCT: 29.2 % — ABNORMAL LOW (ref 39.0–52.0)
MCH: 29.3 pg (ref 26.0–34.0)
MCV: 88.2 fL (ref 78.0–100.0)
RBC: 3.31 MIL/uL — ABNORMAL LOW (ref 4.22–5.81)
RDW: 13.5 % (ref 11.5–15.5)
WBC: 5 10*3/uL (ref 4.0–10.5)

## 2011-02-25 LAB — GLUCOSE, CAPILLARY
Glucose-Capillary: 178 mg/dL — ABNORMAL HIGH (ref 70–99)
Glucose-Capillary: 219 mg/dL — ABNORMAL HIGH (ref 70–99)
Glucose-Capillary: 302 mg/dL — ABNORMAL HIGH (ref 70–99)
Glucose-Capillary: 449 mg/dL — ABNORMAL HIGH (ref 70–99)

## 2011-02-25 LAB — BASIC METABOLIC PANEL
CO2: 18 mEq/L — ABNORMAL LOW (ref 19–32)
Chloride: 107 mEq/L (ref 96–112)
Creatinine, Ser: 0.92 mg/dL (ref 0.50–1.35)
Glucose, Bld: 367 mg/dL — ABNORMAL HIGH (ref 70–99)

## 2011-02-25 MED ORDER — POLYETHYLENE GLYCOL 3350 17 G PO PACK
17.0000 g | PACK | Freq: Every day | ORAL | Status: DC
  Administered 2011-02-25 – 2011-02-28 (×4): 17 g via ORAL
  Filled 2011-02-25 (×5): qty 1

## 2011-02-25 MED ORDER — PIPERACILLIN-TAZOBACTAM 3.375 G IVPB
3.3750 g | Freq: Three times a day (TID) | INTRAVENOUS | Status: DC
  Administered 2011-02-25 – 2011-02-27 (×6): 3.375 g via INTRAVENOUS
  Filled 2011-02-25 (×7): qty 50

## 2011-02-25 MED ORDER — INSULIN ASPART 100 UNIT/ML ~~LOC~~ SOLN
15.0000 [IU] | Freq: Once | SUBCUTANEOUS | Status: AC
  Administered 2011-02-25: 15 [IU] via SUBCUTANEOUS

## 2011-02-25 MED ORDER — INSULIN GLARGINE 100 UNIT/ML ~~LOC~~ SOLN
30.0000 [IU] | Freq: Every day | SUBCUTANEOUS | Status: DC
  Administered 2011-02-25: 30 [IU] via SUBCUTANEOUS
  Filled 2011-02-25: qty 3

## 2011-02-25 MED ORDER — SODIUM CHLORIDE 0.9 % IV SOLN
INTRAVENOUS | Status: DC
  Administered 2011-02-25: 08:00:00 via INTRAVENOUS

## 2011-02-25 MED ORDER — INSULIN ASPART PROT & ASPART (70-30 MIX) 100 UNIT/ML ~~LOC~~ SUSP
20.0000 [IU] | Freq: Every day | SUBCUTANEOUS | Status: DC
  Administered 2011-02-25: 20 [IU] via SUBCUTANEOUS
  Filled 2011-02-25: qty 3

## 2011-02-25 MED ORDER — INSULIN ASPART 100 UNIT/ML ~~LOC~~ SOLN
20.0000 [IU] | Freq: Once | SUBCUTANEOUS | Status: AC
  Administered 2011-02-25: 20 [IU] via SUBCUTANEOUS

## 2011-02-25 MED ORDER — DIPHENHYDRAMINE HCL 25 MG PO CAPS
25.0000 mg | ORAL_CAPSULE | Freq: Every evening | ORAL | Status: DC | PRN
  Administered 2011-02-25 – 2011-02-27 (×3): 25 mg via ORAL
  Filled 2011-02-25 (×3): qty 1

## 2011-02-25 MED ORDER — INSULIN ASPART 100 UNIT/ML ~~LOC~~ SOLN
10.0000 [IU] | Freq: Once | SUBCUTANEOUS | Status: AC
  Administered 2011-02-25: 10 [IU] via SUBCUTANEOUS

## 2011-02-25 MED ORDER — PIPERACILLIN-TAZOBACTAM 3.375 G IVPB 30 MIN
3.3750 g | Freq: Once | INTRAVENOUS | Status: AC
  Administered 2011-02-25: 3.375 g via INTRAVENOUS
  Filled 2011-02-25: qty 50

## 2011-02-25 NOTE — Progress Notes (Addendum)
02/25/2011... 5:56 AM... Summary of events overnight (see previous note as well): CBG 449 @ 0030, notified Dr Timothy Lasso and rec'd order to admin 15 units of Novolog and recheck CBG in 2 hrs, calling MD back if CBG >350.... CBG 381 @ 0248, notified Dr Timothy Lasso and rec'd order to admin add'l 15 units Novolog and recheck as before.... CBG 362 @ 0446, notified Dr Timothy Lasso and rec'd order to admin 10 units Novolog and recheck as before; lab arriving on floor to draw scheduled labs... Next CBG due 0700... Pt had a snack at bedtime but otherwise has not eaten overnight... Pt is asymptomatic; resting; A/O x 4, HOH.... RN Marvia Pickles

## 2011-02-25 NOTE — Progress Notes (Signed)
Subjective: I'm seeing [name] Walker today on routine rounds. He is pleasant and conversant. Beyond a consistent and persistent hacking cough no complaints. His blood sugars have been climbing for unclear reasons. He certainly is not eating excessively and he has no dextrose and IV fluids.  Objective: Vital signs in last 24 hours: Temp:  [97.5 F (36.4 C)-98.7 F (37.1 C)] 98.7 F (37.1 C) (01/25 0446) Pulse Rate:  [70-76] 76  (01/25 0446) Resp:  [16-18] 18  (01/25 0446) BP: (120-148)/(64-80) 148/80 mmHg (01/25 0446) SpO2:  [97 %-100 %] 100 % (01/25 0446) Weight change:   CBG (last 3)   Basename 02/25/11 0647 02/25/11 0446 02/25/11 0248  GLUCAP 358* 362* 381*    Intake/Output from previous day: 01/24 0701 - 01/25 0700 In: -  Out: 300 [Urine:300]  Physical Exam: Patient is awake alert no distress. No JVD or bruits. Lungs overall are relatively clear. Cardiovascular exam regular rate and rhythm no murmur. Abdomen is benign. He is no peripheral edema. Intact pulses. Neurologic awake alert confused regarding circumstances otherwise nonlateralizing.   Lab Results:  Basename 02/24/11 1759 02/24/11 0511 02/23/11 0510  NA -- 136 127*  K -- 4.5 4.4  CL -- 108 98  CO2 -- 20 19  GLUCOSE 488* 237* --  BUN -- 12 20  CREATININE -- 0.99 1.14  CALCIUM -- 8.8 8.8  MG -- -- --  PHOS -- -- --    Basename 02/22/11 1500  AST 8  ALT 8  ALKPHOS 81  BILITOT 0.4  PROT 7.8  ALBUMIN 3.3*    Basename 02/25/11 0510 02/24/11 0511 02/22/11 1500  WBC 5.0 5.6 --  NEUTROABS -- -- 10.8*  HGB 9.7* 10.1* --  HCT 29.2* 29.7* --  MCV 88.2 87.6 --  PLT 337 335 --   Lab Results  Component Value Date   INR 1.07 12/04/2008   INR 1.0 03/26/2008   No results found for this basename: CKTOTAL:3,CKMB:3,CKMBINDEX:3,TROPONINI:3 in the last 72 hours  Basename 02/23/11 0740  TSH 1.341  T4TOTAL --  T3FREE --  THYROIDAB --    Basename 02/23/11 0740  VITAMINB12 944*  FOLATE --  FERRITIN 210  TIBC 201*    IRON 41*  RETICCTPCT --    Studies/Results: Nm Pet Image Initial (pi) Skull Base To Thigh  02/24/2011  *RADIOLOGY REPORT*  Clinical Data:  Initial treatment strategy for abnormal chest CT findings in the left lower lobe.  NUCLEAR MEDICINE PET CT INITIAL (PI) SKULL BASE TO THIGH  Technique:  18.1 mCi F-18 FDG was injected intravenously via the right AC.  Full-ring PET imaging was performed from the skull base through the mid-thighs 55  minutes after injection.  CT data was obtained and used for attenuation correction and anatomic localization only.  (This was not acquired as a diagnostic CT examination.)  Fasting Blood Glucose:  210  Patient Weight:  146 pounds.  Comparison:  Chest CT 02/22/2011.  Findings: Skull Base and Neck:  No abnormal hypermetabolic activity. No abnormal soft tissue masses or lymphadenopathy.  Thorax:   Compared to the recent prior examination, the previously noted and nodular opacity in the left lower lobe is less conspicuous.  There continues to be interstitial prominence, bronchial wall thickening, peribronchovascular micronodularity and ground-glass attenuation in the sub tending portions of the left lower lobe, all likely reflects a resolving infection.  No hypermetabolic activity is noted in this region.  Minimal dependent atelectasis is noted in the right lower lobe.  No definite suspicious appearing pulmonary  nodules or masses are identified. Scarring the medial aspect of the right upper lobe. No mediastinal or hilar adenopathy. Heart size is normal.  Aberrant right subclavian artery (normal anatomical variant) incidentally noted. There is atherosclerosis of the thoracic aorta, the great vessels of the mediastinum and the coronary arteries, including calcified atherosclerotic plaque in the left main, LAD and RCA coronary arteries. There is a large hiatal hernia.  Abdomen/Pelvis:  No abnormal hypermetabolic activity. The uninfused appearance of the liver, gallbladder, pancreas,  spleen and bilateral adrenal glands is unremarkable. There are low-attenuation lesions in the kidneys bilaterally, the largest of which is in the lower pole of the left kidney measuring up to 3.4 cm in diameter. Neither of these is characterized on today's examination, but lack of internal metabolic activity to suggest that these represent cysts.  No abnormal soft tissue masses or lymphadenopathy. No ascites or pneumoperitoneum, and no pathologic distension of bowel. Penile prosthesis noted.  Upper Thighs:  No abnormal hypermetabolic activity. No abnormal soft tissue masses or lymphadenopathy.  IMPRESSION:  1.  No suspicious appearing pulmonary nodule or hypermetabolic activity in the left lower lobe.  When compared to the prior examination from 02/22/2011, previously noted abnormality in the left lower lobe is less prominent, and has an appearance most consistent with a resolving area of infection. No findings to suggest malignancy on today's scan. 2.  Atherosclerosis, including left main and two-vessel coronary artery disease. Please note that although the presence of coronary artery calcium documents the presence of coronary artery disease, the severity of this disease and any potential stenosis cannot be assessed on this non-gated CT examination.  Assessment for potential risk factor modification, dietary therapy or pharmacologic therapy may be warranted, if clinically indicated. 3.  Large hiatal hernia. 4.  Probable bilateral renal cysts.  Original Report Authenticated By: Florencia Reasons, M.D.     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

## 2011-02-25 NOTE — Progress Notes (Addendum)
Inpatient Diabetes Program Recommendations  AACE/ADA: New Consensus Statement on Inpatient Glycemic Control (2009)  Target Ranges:  Prepandial:   less than 140 mg/dL      Peak postprandial:   less than 180 mg/dL (1-2 hours)      Critically ill patients:  140 - 180 mg/dL   Reason for Visit: Hyperglycemia Results for [name] Walker, [name] Walker (MRN [id number]) as of 02/25/2011 12:17  Ref. Range 02/25/2011 04:46 02/25/2011 05:10 02/25/2011 06:47 02/25/2011 10:13 02/25/2011 11:58  Glucose-Capillary Latest Range: 70-99 mg/dL 130 (H)  865 (H) 784 (H) 178 (H)  Glucose Latest Range: 70-99 mg/dL  696 (H)        Inpatient Diabetes Program Recommendations Correction (SSI): Add HS coverage for Novolog correction. Oral Agents: ? to restart metformin 1000 mg bid HgbA1C: Update HgbA1C to assess glucose control prior to hospitalization  Note: CBGs improving now.  RN requested Diabetes Coordinator to talk with pt about his high blood sugars.  Pt stated that really would like to talk with MD and find out what is going on.  Stated Dr. Jacky Kindle came in to see him this morning and pt said he was half asleep and requests that he "or any doctor" come in and talk with him about his blood sugars.  States he takes his insulin at home - 75/25 22 units in am and 22 units at HS.  York Spaniel this was a new regimen for him.  He previously took Lantus 30 units QHS at home.  Checks blood sugars every am and it has been running high at home.  When asked about diet, he said he eats a sausage biscuit and OJ in am, skips lunch, snacks on peanut M&Ms and oreos in the afternoon, and eats frozen dinner at supper.  Then snacks on M&Ms again at night.  Discussed healthy diet options with pt. States wife and son are at home, and son does cook for his daughters. Mainly listened to pt....stated he wants to go home now.  CBG now 178.  Will update RN and pt asked me to call his wife to update.    ** CO2 is dropping.  18 this am. RN to inform MD.  Thanks.   Will continue following.   Ailene Ards, RD, LDN, CDE Inpatient Diabetes Coordinator   Addendum:  Called wife as pt requested to update on blood sugar control.  Gave [name] his latest CBG (178 mg/dL) and discussed importance of controlling blood sugars at home.  Verbalized understanding.

## 2011-02-25 NOTE — Progress Notes (Signed)
Spoke with Dr. Jacky Kindle about CO2 level at 18 this morning.  Discussed sliding scale insulin for HS coverage. Dr. Jacky Kindle does not want to do HS sliding scale insulin at this time to avoid hypoglycemia which patient has been prone to in the past.  No orders at this time.  Will continue to monitor.  Albesa Seen E 2:06 PM 02/25/2011

## 2011-02-25 NOTE — Progress Notes (Signed)
Spoke with Dr. Jacky Kindle on the phone.  Dr. Approved PRN Benadryl and daily miralax.  Dr. Jacky Kindle stated he discussed PET results with patient and diabetic education with patient, however patient is having difficulty remembering what was discussed.  Lillia Abed, myself, RN, will try to answer any questions and/or concerns the patient may have.  Spoke with Diabetes Designer, television/film set.  She will visit patient and discuss and questions or concerns he has regarding his diabetes at this time.  Bjorn Loser stated she will also speak with Patient's wife on phone to help with education.  Lillia Abed, RN spoke with patients wife and wife will not be able to come visit patient today due to lack of ride and weather.  Will continue to monitor patient for agitation and concerns regarding care.  Barrie Lyme 10:56 AM 02/25/2011

## 2011-02-26 LAB — CBC
MCV: 87.2 fL (ref 78.0–100.0)
Platelets: 338 10*3/uL (ref 150–400)
RBC: 3.21 MIL/uL — ABNORMAL LOW (ref 4.22–5.81)
WBC: 5.2 10*3/uL (ref 4.0–10.5)

## 2011-02-26 LAB — BASIC METABOLIC PANEL
CO2: 21 mEq/L (ref 19–32)
Calcium: 8.9 mg/dL (ref 8.4–10.5)
GFR calc Af Amer: 77 mL/min — ABNORMAL LOW (ref >90)
GFR calc non Af Amer: 66 mL/min — ABNORMAL LOW (ref >90)
Sodium: 134 mEq/L — ABNORMAL LOW (ref 135–145)

## 2011-02-26 LAB — GLUCOSE, CAPILLARY
Glucose-Capillary: 347 mg/dL — ABNORMAL HIGH (ref 70–99)
Glucose-Capillary: 452 mg/dL — ABNORMAL HIGH (ref 70–99)

## 2011-02-26 LAB — GLUCOSE, RANDOM: Glucose, Bld: 424 mg/dL — ABNORMAL HIGH (ref 70–99)

## 2011-02-26 MED ORDER — INSULIN ASPART PROT & ASPART (70-30 MIX) 100 UNIT/ML ~~LOC~~ SUSP
25.0000 [IU] | Freq: Every day | SUBCUTANEOUS | Status: DC
  Administered 2011-02-26 – 2011-02-27 (×2): 25 [IU] via SUBCUTANEOUS

## 2011-02-26 MED ORDER — INSULIN GLARGINE 100 UNIT/ML ~~LOC~~ SOLN
35.0000 [IU] | Freq: Every day | SUBCUTANEOUS | Status: DC
  Administered 2011-02-26: 35 [IU] via SUBCUTANEOUS

## 2011-02-26 NOTE — Progress Notes (Signed)
Patient has not had a bowel movement since 02/21/11. Patient bowel sounds are positive in all quadrants. Patient states he is eating okay but has not passed any gas. Made Patient aware that I would call MD to make them aware to start regimen. He states "I do not want nay thing for it , I do not want to have a BM here , I will wait till I go home." Will readdress concerns with him and make the oncoming shift aware.

## 2011-02-26 NOTE — Progress Notes (Signed)
Subjective: [name] Walker seems to be doing reasonably well. He questioned me on his disposition I did explain to him that obviously things weren't working well at home and he did go somewhere for some more intensive care particularly with regards to his blood sugars. Transition after that would be between he and the family but the family clearly needs to be able to monitor and administer insulin. He does continue to have a cough but overall feeling much better.  Objective: Vital signs in last 24 hours: Temp:  [98.6 F (37 C)-98.8 F (37.1 C)] 98.6 F (37 C) (01/26 0626) Pulse Rate:  [59-68] 68  (01/26 0626) Resp:  [18-20] 18  (01/26 0626) BP: (120-136)/(71-83) 133/75 mmHg (01/26 0626) SpO2:  [92 %-98 %] 98 % (01/26 0626) Weight change:   CBG (last 3)   Basename 02/25/11 2108 02/25/11 1614 02/25/11 1158  GLUCAP 302* 219* 178*    Intake/Output from previous day: 01/25 0701 - 01/26 0700 In: 1180.5 [P.O.:240; I.V.:840.5; IV Piggyback:100] Out: 2425 [Urine:2375]  Physical Exam:   Patient is awake alert no distress. No JVD or bruits. Lungs overall are relatively clear. Cardiovascular exam regular rate and rhythm no murmur. Abdomen is benign. He is no peripheral edema. Intact pulses. Neurologic awake alert confused regarding circumstances otherwise nonlateralizing.     Lab Results:  [facility] 02/26/11 0611 02/25/11 0510  NA 134* 137  K 4.1 4.0  CL 102 107  CO2 21 18*  GLUCOSE 363* 367*  BUN 13 10  CREATININE 1.05 0.92  CALCIUM 8.9 8.8  MG -- --  PHOS -- --   No results found for this basename: AST:2,ALT:2,ALKPHOS:2,BILITOT:2,PROT:2,ALBUMIN:2 in the last 72 hours  Basename 02/26/11 0611 02/25/11 0510  WBC 5.2 5.0  NEUTROABS -- --  HGB 9.5* 9.7*  HCT 28.0* 29.2*  MCV 87.2 88.2  PLT 338 337   Lab Results  Component Value Date   INR 1.07 12/04/2008   INR 1.0 03/26/2008   No results found for this basename: CKTOTAL:3,CKMB:3,CKMBINDEX:3,TROPONINI:3 in the last 72 hours No  results found for this basename: TSH,T4TOTAL,FREET3,T3FREE,THYROIDAB in the last 72 hours No results found for this basename: VITAMINB12:2,FOLATE:2,FERRITIN:2,TIBC:2,IRON:2,RETICCTPCT:2 in the last 72 hours  Studies/Results: Nm Pet Image Initial (pi) Skull Base To Thigh  02/24/2011  *RADIOLOGY REPORT*  Clinical Data:  Initial treatment strategy for abnormal chest CT findings in the left lower lobe.  NUCLEAR MEDICINE PET CT INITIAL (PI) SKULL BASE TO THIGH  Technique:  18.1 mCi F-18 FDG was injected intravenously via the right AC.  Full-ring PET imaging was performed from the skull base through the mid-thighs 55  minutes after injection.  CT data was obtained and used for attenuation correction and anatomic localization only.  (This was not acquired as a diagnostic CT examination.)  Fasting Blood Glucose:  210  Patient Weight:  146 pounds.  Comparison:  Chest CT 02/22/2011.  Findings: Skull Base and Neck:  No abnormal hypermetabolic activity. No abnormal soft tissue masses or lymphadenopathy.  Thorax:   Compared to the recent prior examination, the previously noted and nodular opacity in the left lower lobe is less conspicuous.  There continues to be interstitial prominence, bronchial wall thickening, peribronchovascular micronodularity and ground-glass attenuation in the sub tending portions of the left lower lobe, all likely reflects a resolving infection.  No hypermetabolic activity is noted in this region.  Minimal dependent atelectasis is noted in the right lower lobe.  No definite suspicious appearing pulmonary nodules or masses are identified. Scarring the medial aspect of  the right upper lobe. No mediastinal or hilar adenopathy. Heart size is normal.  Aberrant right subclavian artery (normal anatomical variant) incidentally noted. There is atherosclerosis of the thoracic aorta, the great vessels of the mediastinum and the coronary arteries, including calcified atherosclerotic plaque in the left main, LAD  and RCA coronary arteries. There is a large hiatal hernia.  Abdomen/Pelvis:  No abnormal hypermetabolic activity. The uninfused appearance of the liver, gallbladder, pancreas, spleen and bilateral adrenal glands is unremarkable. There are low-attenuation lesions in the kidneys bilaterally, the largest of which is in the lower pole of the left kidney measuring up to 3.4 cm in diameter. Neither of these is characterized on today's examination, but lack of internal metabolic activity to suggest that these represent cysts.  No abnormal soft tissue masses or lymphadenopathy. No ascites or pneumoperitoneum, and no pathologic distension of bowel. Penile prosthesis noted.  Upper Thighs:  No abnormal hypermetabolic activity. No abnormal soft tissue masses or lymphadenopathy.  IMPRESSION:  1.  No suspicious appearing pulmonary nodule or hypermetabolic activity in the left lower lobe.  When compared to the prior examination from 02/22/2011, previously noted abnormality in the left lower lobe is less prominent, and has an appearance most consistent with a resolving area of infection. No findings to suggest malignancy on today's scan. 2.  Atherosclerosis, including left main and two-vessel coronary artery disease. Please note that although the presence of coronary artery calcium documents the presence of coronary artery disease, the severity of this disease and any potential stenosis cannot be assessed on this non-gated CT examination.  Assessment for potential risk factor modification, dietary therapy or pharmacologic therapy may be warranted, if clinically indicated. 3.  Large hiatal hernia. 4.  Probable bilateral renal cysts.  Original Report Authenticated By: Florencia Reasons, M.D.     Assessment/Plan: #1 diabetic nonketotic hyperosmolar state resolved  #2 diabetes mellitus type 2 insulin-dependent compliance at home a major issue at this point some difficulty with blood sugars in house leading to suspicion of  underlying infectious etiology please see PET scan  #3 metabolic/hyponatremia/acute renal failure resolved  #4 frontal lobe stroke on aspirin  #5 memory disorder multifactorial likely vascular in alcohol  #6 abnormal chest CT with PET scan noted likely pneumonia questionable aspiration we'll initiate antibiotics despite the lack of fever and white count particularly in view of the rising blood sugars  #7 seizure disorder with remote CNS bleed  #8 known coronary artery disease with prior stent       LOS: 4 days   Angala Hilgers A 02/26/2011, 8:11 AM

## 2011-02-26 NOTE — Progress Notes (Signed)
Wife called stating patient was accepted to [facility].  I notified weekend social worker of this, put numbers on front of chart.  SW will follow-up on this Monday.

## 2011-02-26 NOTE — Progress Notes (Deleted)
jp drain d/c'd pt tolerated well. Catheter intact. Pressure dressing applied.

## 2011-02-27 LAB — GLUCOSE, CAPILLARY
Glucose-Capillary: 230 mg/dL — ABNORMAL HIGH (ref 70–99)
Glucose-Capillary: 289 mg/dL — ABNORMAL HIGH (ref 70–99)
Glucose-Capillary: 231 mg/dL — ABNORMAL HIGH (ref 70–99)

## 2011-02-27 MED ORDER — INSULIN ASPART PROT & ASPART (70-30 MIX) 100 UNIT/ML ~~LOC~~ SUSP
30.0000 [IU] | Freq: Every day | SUBCUTANEOUS | Status: DC
  Administered 2011-02-28: 30 [IU] via SUBCUTANEOUS

## 2011-02-27 MED ORDER — MOXIFLOXACIN HCL 400 MG PO TABS
400.0000 mg | ORAL_TABLET | Freq: Every day | ORAL | Status: DC
  Administered 2011-02-27: 400 mg via ORAL
  Filled 2011-02-27 (×3): qty 1

## 2011-02-27 MED ORDER — INSULIN GLARGINE 100 UNIT/ML ~~LOC~~ SOLN
40.0000 [IU] | Freq: Every day | SUBCUTANEOUS | Status: DC
  Administered 2011-02-27: 40 [IU] via SUBCUTANEOUS

## 2011-02-27 NOTE — Progress Notes (Signed)
Subjective: Doing well. Nursing reports constipation but he relates none. Intake excellent. He does have a minor cough but no other pulmonary symptoms.  Objective: Vital signs in last 24 hours: Temp:  [98.1 F (36.7 C)-98.4 F (36.9 C)] 98.1 F (36.7 C) (01/27 0516) Pulse Rate:  [65-75] 69  (01/27 0516) Resp:  [18-20] 20  (01/27 0516) BP: (125-131)/(64-70) 125/64 mmHg (01/27 0516) SpO2:  [98 %] 98 % (01/27 0516) Weight change:   CBG (last 3)   Basename 02/27/11 0735 02/26/11 2222 02/26/11 1621  GLUCAP 230* 337* 452*    Intake/Output from previous day: 01/26 0701 - 01/27 0700 In: 100 [IV Piggyback:100] Out: 2725 [Urine:2725]  Physical Exam: Patient is awake alert no distress. No JVD or bruits. Lungs overall are relatively clear. Cardiovascular exam regular rate and rhythm no murmur. Abdomen is benign. He is no peripheral edema. Intact pulses. Neurologic awake alert confused regarding circumstances otherwise nonlateralizing.    Lab Results:  Basename 02/26/11 1750 02/26/11 0611 02/25/11 0510  NA -- 134* 137  K -- 4.1 4.0  CL -- 102 107  CO2 -- 21 18*  GLUCOSE 424* 363* --  BUN -- 13 10  CREATININE -- 1.05 0.92  CALCIUM -- 8.9 8.8  MG -- -- --  PHOS -- -- --   No results found for this basename: AST:2,ALT:2,ALKPHOS:2,BILITOT:2,PROT:2,ALBUMIN:2 in the last 72 hours  Basename 02/26/11 0611 02/25/11 0510  WBC 5.2 5.0  NEUTROABS -- --  HGB 9.5* 9.7*  HCT 28.0* 29.2*  MCV 87.2 88.2  PLT 338 337   Lab Results  Component Value Date   INR 1.07 12/04/2008   INR 1.0 03/26/2008   No results found for this basename: CKTOTAL:3,CKMB:3,CKMBINDEX:3,TROPONINI:3 in the last 72 hours No results found for this basename: TSH,T4TOTAL,FREET3,T3FREE,THYROIDAB in the last 72 hours No results found for this basename: VITAMINB12:2,FOLATE:2,FERRITIN:2,TIBC:2,IRON:2,RETICCTPCT:2 in the last 72 hours  Studies/Results: No results found.   Assessment/Plan:  #1 diabetic nonketotic  hyperosmolar state resolved  #2 diabetes mellitus type 2 insulin-dependent compliance at home a major issue at this point some difficulty with blood sugars in house leading to suspicion of underlying infectious etiology please see PET scan  #3 metabolic/hyponatremia/acute renal failure resolved  #4 frontal lobe stroke on aspirin  #5 memory disorder multifactorial likely vascular in alcohol  #6 abnormal chest CT with PET scan noted likely pneumonia questionable aspiration we'll initiate antibiotics despite the lack of fever and white count particularly in view of the rising blood sugars  #7 seizure disorder with remote CNS bleed  #8 known coronary artery disease with prior stent    will adjust insulin and convert to orals hopefully     LOS: 5 days   Leahna Hewson A 02/27/2011, 9:41 AM

## 2011-02-27 NOTE — Progress Notes (Signed)
CRITICAL VALUE ALERT  Critical value received:  CBG 404  Date of notification:  02-27-2011  Time of notification:  1700  Critical value read back:yes  Nurse who received alert:  Carmon Ginsberg, RN  MD notified (1st page): Dr. Jacky Kindle  Time of first page:  1700  MD notified (2nd page):n/a; he immediately answered phone.  Time of second page:  Responding MD:  Dr. Jacky Kindle  Time MD responded:  [phone]

## 2011-02-28 LAB — GLUCOSE, CAPILLARY
Glucose-Capillary: 209 mg/dL — ABNORMAL HIGH (ref 70–99)
Glucose-Capillary: 264 mg/dL — ABNORMAL HIGH (ref 70–99)

## 2011-02-28 MED ORDER — INSULIN GLARGINE 100 UNIT/ML ~~LOC~~ SOLN: 40.0000 [IU] | Freq: Every day | SUBCUTANEOUS | Status: DC

## 2011-02-28 MED ORDER — POLYETHYLENE GLYCOL 3350 17 G PO PACK: 17.0000 g | PACK | Freq: Every day | ORAL | Status: AC

## 2011-02-28 MED ORDER — INSULIN ASPART 100 UNIT/ML ~~LOC~~ SOLN: 0.0000 [IU] | Freq: Three times a day (TID) | SUBCUTANEOUS | Status: DC

## 2011-02-28 MED ORDER — MOXIFLOXACIN HCL 400 MG PO TABS: 400.0000 mg | ORAL_TABLET | Freq: Every day | ORAL | Status: AC

## 2011-02-28 MED ORDER — INSULIN ASPART PROT & ASPART (70-30 MIX) 100 UNIT/ML ~~LOC~~ SUSP: 30.0000 [IU] | Freq: Every day | SUBCUTANEOUS | Status: DC

## 2011-02-28 MED ORDER — DIPHENHYDRAMINE HCL 25 MG PO CAPS: 25.0000 mg | ORAL_CAPSULE | Freq: Every evening | ORAL | Status: AC | PRN

## 2011-02-28 NOTE — Progress Notes (Signed)
Physical Therapy Treatment Patient Details Name: [name] Walker MRN: [id number] DOB: 1931-05-16 Today's Date: 02/28/2011  PT Assessment/Plan  PT - Assessment/Plan Comments on Treatment Session: Patient improved with balance today wearing his tennis shoes.  He still demonstrates right lateral instability with h/o CVA. Will benefit from skilled PT in SNF setting at discharge. PT Plan: Discharge plan remains appropriate PT Frequency: Min 3X/week Follow Up Recommendations: Skilled nursing facility Equipment Recommended: None recommended by PT PT Goals  Acute Rehab PT Goals Pt will go Sit to Stand: Independently PT Goal: Sit to Stand - Progress: Met Pt will go Stand to Sit: Independently PT Goal: Stand to Sit - Progress: Progressing toward goal Pt will Ambulate: >150 feet;with least restrictive assistive device;with modified independence PT Goal: Ambulate - Progress: Progressing toward goal Additional Goals PT Goal: Additional Goal #1 - Progress: Progressing toward goal  PT Treatment Precautions/Restrictions  Precautions Precautions: Fall Restrictions Weight Bearing Restrictions: No Mobility (including Balance) Bed Mobility Supine to Sit: 7: Independent Transfers Sit to Stand: 5: Supervision;With upper extremity assist Sit to Stand Details (indicate cue type and reason): for safety Stand to Sit: 5: Supervision;With upper extremity assist Ambulation/Gait Ambulation/Gait: Yes Ambulation/Gait Assistance: 4: Min assist;5: Supervision Ambulation/Gait Assistance Details (indicate cue type and reason): minguard to supervision, occasional imbalances esp to right with looking around, improves with cues for increased speed. Ambulation Distance (Feet): 400 Feet Assistive device: None Gait Pattern: Step-through pattern  High Level Balance High Level Balance Activites: Side stepping;Backward walking;Direction changes;Turns;Head turns;Other (comment) High Level Balance Comments: crossing  over, tandem walk forward and back, tandem stand with left foot in back up to 14 sec, right foot in back 5 sec. Exercise    End of Session PT - End of Session Activity Tolerance: Patient tolerated treatment well Patient left: in chair;with call bell in reach General Behavior During Session: [place] for tasks performed Cognition: [facility] for tasks performed  [facility] 02/28/2011, 3:02 PM

## 2011-02-28 NOTE — Progress Notes (Signed)
Pt to d/c to SNF no changes noted since am assessment.

## 2011-02-28 NOTE — Discharge Summary (Signed)
DISCHARGE SUMMARY  STEPHENS SHREVE  MR#: [id number]  DOB:[birth year]  Date of Admission: 02/22/2011 Date of Discharge: 02/28/2011  Attending Physician:Lily Kernen A  Patient's PCP:No primary provider on file.  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, CNS bleed and remote alcohol use  CVA (cerebral infarction)-remote  Seizure disorder following remote CNS bleed  Essential hypertension  Left lower lobe nodular densities felt to be inflammatory/infectious based upon CT and PET scanning on empiric antibiotics  Diabetes mellitus type 2 poorly controlled due to noncompliance slowly improving  Chronic constipation   Discharge Medications: Medication List  As of 02/28/2011  6:46 AM   STOP taking these medications         DULoxetine 60 MG capsule      insulin lispro 100 UNIT/ML injection      OVER THE COUNTER MEDICATION         TAKE these medications         acetaminophen 500 MG tablet   Commonly known as: TYLENOL   Take 1,000 mg by mouth every 6 (six) hours as needed. For pain/headache      aspirin EC 81 MG tablet   Take 81 mg by mouth at bedtime.      cholecalciferol 1000 UNITS tablet   Commonly known as: VITAMIN D   Take 1,000 Units by mouth daily.      diphenhydrAMINE 25 mg capsule   Commonly known as: BENADRYL   Take 1 capsule (25 mg total) by mouth at bedtime as needed for itching.      insulin aspart 100 UNIT/ML injection   Commonly known as: novoLOG   Inject 0-15 Units into the skin 3 (three) times daily with meals.      insulin aspart protamine-insulin aspart (70-30) 100 UNIT/ML injection   Commonly known as: NOVOLOG 70/30   Inject 30 Units into the skin daily with breakfast.      insulin glargine 100 UNIT/ML injection   Commonly known as: LANTUS   Inject 40 Units into the skin at bedtime.      levETIRAcetam 500 MG  tablet   Commonly known as: KEPPRA   Take 500 mg by mouth daily.      lisinopril 5 MG tablet   Commonly known as: PRINIVIL,ZESTRIL   Take 5 mg by mouth daily.      metFORMIN 1000 MG tablet   Commonly known as: GLUCOPHAGE   Take 1,000 mg by mouth 2 (two) times daily with a meal.      metoprolol succinate 50 MG 24 hr tablet   Commonly known as: TOPROL-XL   Take 50 mg by mouth daily. Take with or immediately following a meal.      moxifloxacin 400 MG tablet   Commonly known as: AVELOX   Take 1 tablet (400 mg total) by mouth daily at 6 PM.      polyethylene glycol packet   Commonly known as: MIRALAX / GLYCOLAX   Take 17 g by mouth daily.            Hospital Procedures: Dg Chest 2 View  02/22/2011  *RADIOLOGY REPORT*  Clinical Data: Cough  CHEST - 2 VIEW  Comparison: 04/10/2008  Findings: Normal heart size. Linear atelectasis at the left base. On the lateral view, at the posterior costophrenic angle, there is a 1.5 cm spiculated opacity.  Pulmonary nodule is not excluded.  No pneumothorax and no pleural effusion.  The aorta remains tortuous. Thorax is rotated to the right limiting visualization of the mediastinum.  IMPRESSION: Possible posterior basilar spiculated nodule as described.  CT chest is recommended to further characterize.  See lateral view.  Original Report Authenticated By: Donavan Burnet, M.D.   Ct Chest W Contrast  02/22/2011  *RADIOLOGY REPORT*  Clinical Data: Abnormal chest x-ray  CT CHEST WITH CONTRAST  Technique:  Multidetector CT imaging of the chest was performed following the standard protocol during bolus administration of intravenous contrast.  Contrast: 80mL OMNIPAQUE IOHEXOL 300 MG/ML IV SOLN  Comparison: 03/27/2008  Findings: The radiographic finding on the lateral image corresponds to irregular pulmonary opacities in the left lower lobe.  There is a 2.1 cm triangular opacity on image 46 as well as a 1.3 cm spiculated density, more inferior, on image 52.  These  to focal densities correspond to a a number of adjacent centrilobular nodules and peribronchial opacities.  There is associated bronchiolectasis in the left lower lobe.  Negative abnormal mediastinal adenopathy.  A aberrant right subclavian artery, a normal variation.  Coronary artery calcifications in the left anterior descending and circumflex artery.  A large hiatal hernia is noted.  Adrenal glands are unremarkable.  The bilateral renal hypodensities.  No destructive bone lesion.  Degenerative disc disease at C6-7 with posterior disc osteophytes.  IMPRESSION: The radiographic abnormality corresponds to a spiculated density at the left base.  There is an adjacent larger focal pulmonary opacity and other centrilobular nodules.  These findings may represent an inflammatory process however the findings are concerning enough to raise suspicion for malignancy.  PET CT may be helpful.  Hiatal hernia.  Original Report Authenticated By: Donavan Burnet, M.D.   Mr Brain Wo Contrast  02/10/2011  *RADIOLOGY REPORT*  Clinical Data: Memory loss.  Loss of balance.  Unsteady gait. Urinary incontinence.  Confusion.  Prior craniotomy for subdural hematoma.  MRI HEAD WITHOUT CONTRAST  Technique:  Multiplanar, multiecho pulse sequences of the brain and surrounding structures were obtained according to standard protocol without intravenous contrast.  Comparison: 12/07/2008.  Findings: Prior right frontal craniotomy for subdural hematoma treatment as per history provided.  There is minimal dural thickening in this region but without discrete subdural hematoma.  New from prior examination is what appears to be a moderate size anterior to mid right frontal lobe subacute infarct with minimal laminar necrosis. This can be reevaluated on follow-up if there are progressive symptoms.  Remote right corona radiata small infarct.  Small vessel disease type changes most notable periventricular region.  Global atrophy.  The ventricular  prominence may be related atrophy although mild hydrocephalus is not excluded.  This appearance is without significant change.  Major intracranial vascular structures are patent with atherosclerotic type changes left vertebral artery.  Banding of the globes.  Mild exophthalmos on the right unchanged.  Mild paranasal sinus mucosal thickening.  IMPRESSION: New from prior examination is what appears to be a moderate size anterior to mid right frontal lobe subacute infarct with minimal laminar necrosis.  Global atrophy.  The ventricular prominence may be related atrophy although mild hydrocephalus is not excluded.  This appearance is without significant change.  Mild paranasal sinus mucosal thickening.  Original Report Authenticated By: Fuller Canada, M.D.   Nm Pet Image Initial (pi) Skull Base To Thigh  02/24/2011  *RADIOLOGY REPORT*  Clinical Data:  Initial treatment strategy for abnormal chest CT findings in the left lower lobe.  NUCLEAR MEDICINE PET CT INITIAL (PI) SKULL  BASE TO THIGH  Technique:  18.1 mCi F-18 FDG was injected intravenously via the right AC.  Full-ring PET imaging was performed from the skull base through the mid-thighs 55  minutes after injection.  CT data was obtained and used for attenuation correction and anatomic localization only.  (This was not acquired as a diagnostic CT examination.)  Fasting Blood Glucose:  210  Patient Weight:  146 pounds.  Comparison:  Chest CT 02/22/2011.  Findings: Skull Base and Neck:  No abnormal hypermetabolic activity. No abnormal soft tissue masses or lymphadenopathy.  Thorax:   Compared to the recent prior examination, the previously noted and nodular opacity in the left lower lobe is less conspicuous.  There continues to be interstitial prominence, bronchial wall thickening, peribronchovascular micronodularity and ground-glass attenuation in the sub tending portions of the left lower lobe, all likely reflects a resolving infection.  No hypermetabolic  activity is noted in this region.  Minimal dependent atelectasis is noted in the right lower lobe.  No definite suspicious appearing pulmonary nodules or masses are identified. Scarring the medial aspect of the right upper lobe. No mediastinal or hilar adenopathy. Heart size is normal.  Aberrant right subclavian artery (normal anatomical variant) incidentally noted. There is atherosclerosis of the thoracic aorta, the great vessels of the mediastinum and the coronary arteries, including calcified atherosclerotic plaque in the left main, LAD and RCA coronary arteries. There is a large hiatal hernia.  Abdomen/Pelvis:  No abnormal hypermetabolic activity. The uninfused appearance of the liver, gallbladder, pancreas, spleen and bilateral adrenal glands is unremarkable. There are low-attenuation lesions in the kidneys bilaterally, the largest of which is in the lower pole of the left kidney measuring up to 3.4 cm in diameter. Neither of these is characterized on today's examination, but lack of internal metabolic activity to suggest that these represent cysts.  No abnormal soft tissue masses or lymphadenopathy. No ascites or pneumoperitoneum, and no pathologic distension of bowel. Penile prosthesis noted.  Upper Thighs:  No abnormal hypermetabolic activity. No abnormal soft tissue masses or lymphadenopathy.  IMPRESSION:  1.  No suspicious appearing pulmonary nodule or hypermetabolic activity in the left lower lobe.  When compared to the prior examination from 02/22/2011, previously noted abnormality in the left lower lobe is less prominent, and has an appearance most consistent with a resolving area of infection. No findings to suggest malignancy on today's scan. 2.  Atherosclerosis, including left main and two-vessel coronary artery disease. Please note that although the presence of coronary artery calcium documents the presence of coronary artery disease, the severity of this disease and any potential stenosis cannot be  assessed on this non-gated CT examination.  Assessment for potential risk factor modification, dietary therapy or pharmacologic therapy may be warranted, if clinically indicated. 3.  Large hiatal hernia. 4.  Probable bilateral renal cysts.  Original Report Authenticated By: Florencia Reasons, M.D.    History of Present Illness: Kathlene November is a very pleasant long-standing patient of mine who is brought in today upon my phone call to the wife telling her to call 911 and have him transported to the emergency room. The current illness goes back at least 2 weeks and he spent seen in my office at least 3-4 times when the initial presentation was memory decline and it was obvious at that evaluation he had significant polyuria and polydipsia. Workup did include an MRI which revealed a remote frontal lobe stroke clearly not causative in the current condition. More importantly he had multiple metabolic  arrangements to include a blood sugar approaching 1000 and significant hyponatremia. Wife and INR to have a discussion on at least 2 prior visits that with a rising hemoglobin A1c that he was not getting his insulin medications as instructed, review of pharmacy records demonstrated meds were not being filled as instructed therefore there were compliance issues leading up to even this event under the best of circumstances. Wife was asked to monitor and give insulin but this had not happened. With his son present at this point stay were firmly instructed to give all insulin and provide with sliding scale and Lantus with subsequent followup the patient was improving. On at least a second and third visit similar findings were undertaken and I did threaten Adult Protective Services with the reassurance from son that insulin and blood sugars would be given and monitored again my reinforcement even prior to this he was not getting what he needed. Despite all of this he continues to have high blood sugars, confusion, and ultimately with  an inability to get any type of cooperation or compliance from both son and wife with the threat of my calling the police department wife did summon EMS to transport my to the emergency room. In the emergency room he is awake pleasant conversant knows me knows that he is here and blood sugar certainly was elevated but he was not acidotic. He denies any complaints at this time and is eating a full meal. He is admitted with basically a hyperosmolar state for stabilization with transition to institutionalization at least short-term.      Hospital Course: Patient was omitted to the medical service placed on sliding scale insulin and mixed Lantus as well as scheduled 70/30 insulin in the morning. He did well blood sugars did remain stable given his inconsistent intake antibiotic use but metabolically he cleared up nicely. His hyponatremia and volume depletion/acute renal insufficiency resolved and he returned to baseline. Mentally he was never terribly compromised. As a baseline he is forgetful and confused regarding circumstances but clearly no self, place and and immediacy of issues thus can easily fall you. Incidentally, based upon his cough a chest x-ray revealed a questionable nodular density. CT scanning was done and this confirmed questionable mass and nodules not clear as to whether they were infectious or malignant. Subsequent PET scanning was done and this was clearly more consistent with an inflammatory infectious etiology and revealed the these findings are to be done resolving. It was a rising potentially there was an episode of aspiration given his altered mental status and confusion in view of his hyperosmolar state that gone on for 2 weeks. He was treated with empiric antibiotics clinically demonstrated no further symptomatology beyond a mild hacking cough. Lung fields remain clear. He will record her followup chest x-ray and/or CT in about 8 weeks. From a metabolic and diabetic standpoint we have  placed him on Lantus, sliding scale and 7030 in the morning. This will be continued adjustment. His baseline metformin has just been restarted as his renal function has normalized. Intake is excellent. He is being placed in skilled nursing given his deconditioned state and given family's inability to manage his diabetes. As a history of present illness relates this started long before his hyperosmolar state and whether the wife is not willing or and capable remains unclear but this will need to be reassessed prior to discharge back to home family environment. His bowels have been managed quite nicely with MiraLAX. He is somewhat unsteady in his  feet and will require OT and PT in the skilled nursing arena. He has been stable with regards to his hypertension and cardiac disease; he does have cardiac stents. Finally from a memory standpoint all serology, thyroid, B12 were within normal limits. Given his history based upon his remote CNS bleed from his effient at the time of his cardiac intervention, fairly significant alcohol history over many years not current, and vascular disease this is the underlying basis. I've explained all this to the wife on multiple occasions but remains unclear, to this truly 6 and.  Day of Discharge Exam BP 108/60  Pulse 66  Temp(Src) 98.1 F (36.7 C) (Oral)  Resp 20  Ht 5\' 8"  (1.727 m)  Wt 66.225 kg (146 lb)  BMI 22.20 kg/m2  SpO2 96%  Physical Exam: General appearance: alert, cooperative, appears stated age and no distress Eyes: no scleral icterus Throat: oropharynx moist without erythema Resp: clear to auscultation bilaterally Cardio: regular rate and rhythm, S1, S2 normal, no murmur, click, rub or gallop Extremities: no clubbing, cyanosis or edema Neurologically he is awake alert conversant knows place self immediate circumstances. Motor exam is 5 out of 5. His only true memory deficits deal with circumstances both distant and short-term.  Discharge  Labs:  [facility] 02/26/11 1750 02/26/11 0611  NA -- 134*  K -- 4.1  CL -- 102  CO2 -- 21  GLUCOSE 424* 363*  BUN -- 13  CREATININE -- 1.05  CALCIUM -- 8.9  MG -- --  PHOS -- --   No results found for this basename: AST:2,ALT:2,ALKPHOS:2,BILITOT:2,PROT:2,ALBUMIN:2 in the last 72 hours  Basename 02/26/11 0611  WBC 5.2  NEUTROABS --  HGB 9.5*  HCT 28.0*  MCV 87.2  PLT 338   No results found for this basename: CKTOTAL:3,CKMB:3,CKMBINDEX:3,TROPONINI:3 in the last 72 hours No results found for this basename: TSH,T4TOTAL,FREET3,T3FREE,THYROIDAB in the last 72 hours No results found for this basename: VITAMINB12:2,FOLATE:2,FERRITIN:2,TIBC:2,IRON:2,RETICCTPCT:2 in the last 72 hours  Discharge instructions: Discharge Orders    Future Appointments: Provider: Department: Dept Phone: Center:   03/17/2011 2:30 PM Denton Meek, MD [facility] [phone] None     Future Orders Please Complete By Expires   Diet - low sodium heart healthy      Increase activity slowly         Disposition: Skilled nursing facility  Follow-up Appts: Follow-up with Dr. Jacky Kindle at [facility] in after discharge from skilled nursing.  Call for appointment.  Condition on Discharge: Improved  Tests Needing Follow-up: Patient will need a CT chest to followup nodular densities in approximately 8 weeks.  Signed: Emilie Carp A 02/28/2011, 6:46 AM

## 2011-02-28 NOTE — Progress Notes (Signed)
Patient set to discharge to [facility] until bed becomes available @ [place]. Patient & wife agreeable. [facility] called for transportation.   Unice Bailey, LCSWA [phone]

## 2011-03-17 ENCOUNTER — Encounter: Payer: Self-pay | Admitting: Neurology

## 2011-03-17 ENCOUNTER — Ambulatory Visit (INDEPENDENT_AMBULATORY_CARE_PROVIDER_SITE_OTHER): Payer: Medicare Other | Admitting: Neurology

## 2011-03-17 DIAGNOSIS — R569 Unspecified convulsions: Secondary | ICD-10-CM

## 2011-03-17 DIAGNOSIS — R413 Other amnesia: Secondary | ICD-10-CM

## 2011-03-17 NOTE — Patient Instructions (Addendum)
Your EEG will be scheduled at [facility].  I will call you with that appointment date and time.  [phone].   We will refer you to the [facility] located at [address] in [place] for your memory testing with Dr. Leonides Cave . They will call you to make the appointment.   [phone].   Your next appointment with Dr. Modesto Charon is scheduled on April 16th at 3:00 pm.   [phone].

## 2011-03-17 NOTE — Progress Notes (Signed)
Dear Dr. Jacky Kindle,  Thank you for having me see [name] Walker in consultation today at [facility] for his problem with memory.  As you may recall, he is a 78 y.o. year old male with a history of diabetes, subdural hematoma secondary to anti-coagulant therapy, and subsequent seizure who presents with memory decline that the patient himself denies.  Apparently he has had very poorly controlled blood glucose, including a recent admission for non-ketotic hyperglycemia with a blood glucose in the 1000s which is felt to be due to the patient forgetting his medicine.  Because of your concern for his inability to look after himself, you informed adult protective services.  Currently his wife and son are attempting to monitor his medication administration.  However, the patient denies any significant memory problems.  He says that it is a "figment of the imagination".  He continues to drive.  He claims no problems taking medications, and paying the bills at home.  He does say that his wife says he frequently gets confused between night and day.    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  He is currently not drinking alcohol, but did drink a considerable amount in the past.  When he was admitted for his increased blood glucose an MRI brain revealed a new subacute right frontal infarct.  Carotid dopplers were unremarkable.  Past Medical History  Diagnosis Date  . Coronary artery disease   . Diabetes mellitus   . Hypertension   . Hyperlipidemia   . Headache   . Seizure   . Arthritis   . Anemia   . Blind left eye   . Subdural hematoma     hx recurrent right subdural hematoma    Past Surgical History  Procedure Date  . Appendectomy     AGE 50  . Rhino-septoplasty 1976  . Knee arthroscopy 1981    left knee  . Retinal detachment surgery 1989    left  . Cataract extraction 1995    right eye  .  Knee arthroscopy 1998    right knee  . Heart catherization 2001  . Knee surgery 2003    revision of left knee  . Penile prosthesis implant 2005  . Shoulder arthroscopy 2005, 2006    right shoulder  . Knee surgery 1998    left knee lateral release  . Replacement total knee bilateral     History   Social History  . Marital Status: Married    Spouse Name: N/A    Number of Children: N/A  . Years of Education: N/A   Social History Main Topics  . Smoking status: Former Smoker -- 19 years    Quit date: 02/21/1966  . Smokeless tobacco: Never Used  . Alcohol Use: No     quit drinking in 2011  . Drug Use: No  . Sexually Active: None   Other Topics Concern  . None   Social History Narrative  . None    No family history on file.  Current Outpatient Prescriptions on File Prior to Visit  Medication Sig Dispense Refill  . acetaminophen (TYLENOL) 500 MG tablet Take 1,000 mg by mouth every 6 (six) hours as needed. For pain/headache      . aspirin EC 81 MG tablet Take 81 mg by mouth at bedtime.      . cholecalciferol (VITAMIN D) 1000 UNITS tablet Take 1,000 Units by mouth daily.      Marland Kitchen  insulin aspart (NOVOLOG) 100 UNIT/ML injection Inject 0-15 Units into the skin 3 (three) times daily with meals.  1 vial  5  . insulin aspart protamine-insulin aspart (NOVOLOG 70/30) (70-30) 100 UNIT/ML injection Inject 30 Units into the skin daily with breakfast.  10 mL  3  . insulin glargine (LANTUS) 100 UNIT/ML injection Inject 40 Units into the skin at bedtime.  10 mL  3  . levETIRAcetam (KEPPRA) 500 MG tablet Take 500 mg by mouth daily.      Marland Kitchen lisinopril (PRINIVIL,ZESTRIL) 5 MG tablet Take 5 mg by mouth daily.      . metFORMIN (GLUCOPHAGE) 1000 MG tablet Take 1,000 mg by mouth 2 (two) times daily with a meal.      . metoprolol succinate (TOPROL-XL) 50 MG 24 hr tablet Take 50 mg by mouth daily. Take with or immediately following a meal.        No Known Allergies    ROS:  13 systems were  reviewed and are notable for left eye decreased vision secondary to retinal detachment.  No changes in gait, no changes in urinary continence.  All other review of systems are unremarkable.   Examination:  Filed Vitals:   03/17/11 1402  BP: 150/70  Pulse: 80  Weight: 164 lb 8 oz (74.617 kg)     In general, well appearing man.  Cardiovascular: The patient has a regular rate and rhythm and no carotid bruits.  Fundoscopy:  Disks are flat. Vessel caliber within normal limits.  Left eye could not be visualized properly given lack of lens.    Mental status:   MMSE 1 lost for place, 1 lost for 3 word recall, 1 lost for serial 7s.  Cranial Nerves: Right pupil reactive.  Left pupil irregular unreactive. Visual fields full to confrontation. Extraocular movements are intact without nystagmus. Facial sensation and muscles of mastication are intact. Muscles of facial expression are symmetric. Hearing intact to bilateral finger rub. Tongue protrusion, uvula, palate midline.  Shoulder shrug intact  FRS: - glabellar, - snout, - PM  Motor:  The patient has normal bulk and tone, no pronator drift.  There are no adventitious movements.  5/5 muscle strength bilaterally.  Reflexes:  Symmetric, quiet.  Toes down  Coordination:  Normal finger to nose.   Sensation is symmetric to light touch.  Gait and Station are normal.  Romberg is negative  MRI Brain was reviewed and revealed right frontal subacute infarction and global atrophy.  Impression: [name] Walker memory is certainly not normal.  However, I am not convinced he meets criteria for a dementia.  It is possible that his right frontal infarction has impaired his insight but this would not explain the previous problems with glucose control. Also, his long drinking history is like contributing.  I am going to get memory testing.  I am particularly interested in his executive functions given his frontal infarction.   I am also going to get  an EEG as this may allow [place] to document diffuse cortical dysfunction.  Given he had an infarction on aspirin  I would also suggest a move to clopidogrel.    Thank you for having [place] see [name] Walker in consultation.  Feel free to contact me with any questions.  Lupita Raider Modesto Charon, MD [facility], [place] [address] [place], [place] [address] Phone: [phone] Fax: [phone].

## 2011-03-18 ENCOUNTER — Telehealth: Payer: Self-pay | Admitting: Neurology

## 2011-03-18 NOTE — Telephone Encounter (Signed)
Called and spoke with the patient. Informed of EEG scheduled on 2/19 at 11:30 to arrive at 11:15 to first floor admitting. No additional questions voiced at this time.

## 2011-03-18 NOTE — Telephone Encounter (Signed)
Message copied by Benay Spice on Fri Mar 18, 2011  8:01 AM ------      Message from: Wilhemina Cash H      Created: Thu Mar 17, 2011  4:15 PM      Regarding: EEG appt       Call the patient with an EEG appt. Left a message for the EEG lab to call me to schedule.

## 2011-03-22 ENCOUNTER — Ambulatory Visit (HOSPITAL_COMMUNITY)
Admission: RE | Admit: 2011-03-22 | Discharge: 2011-03-22 | Disposition: A | Discharge status: Home / Self Care | Payer: Medicare Other | Source: Ambulatory Visit | Attending: Neurology | Admitting: Neurology

## 2011-03-22 DIAGNOSIS — R569 Unspecified convulsions: Secondary | ICD-10-CM

## 2011-03-22 DIAGNOSIS — R413 Other amnesia: Secondary | ICD-10-CM

## 2011-03-22 DIAGNOSIS — R9401 Abnormal electroencephalogram [EEG]: Secondary | ICD-10-CM | POA: Insufficient documentation

## 2011-03-24 NOTE — Procedures (Signed)
EEG NUMBER:  [id number]  This routine EEG was requested in this 78 year old man who has had a history of a subdural hematoma status post craniotomy and who has had problems with memory.  He is on no anticonvulsant medication.  The EEG was done with the patient awake and drowsy.  During periods of maximal wakefulness, the patient was noted to have an 8-9 cycle per second, moderately regulated moderately sustained low amplitude alpha rhythm.  Background activities were composed of low amplitude, moderately organized alpha activities.  A note was made of focal higher amplitude theta activities in the right central region with underlying theta and occasional delta activities.  Photic stimulation did not produce a driving response.  Hyperventilation was not performed.  The patient did become drowsy as evidenced by slower symmetric bursts of delta and theta activities, as well as an attenuation in the alpha rhythm.  The patient did not enter stage 2 sleep.  CLINICAL INTERPRETATION:  This routine EEG done with the patient awake and drowsy is abnormal.  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  In addition, the underlying delta and theta activities suggest an underlying structural lesion in that location.          ______________________________ Denton Meek, MD    [id number] D:  03/24/2011 13:49:41  T:  03/24/2011 14:08:35  Job #:  [id number]  cc:   Loraine Leriche A. Perini, M.D. Fax: [phone]  Geoffry Paradise, M.D. Fax: [phone]

## 2011-04-11 DIAGNOSIS — R413 Other amnesia: Secondary | ICD-10-CM

## 2011-05-17 ENCOUNTER — Ambulatory Visit (INDEPENDENT_AMBULATORY_CARE_PROVIDER_SITE_OTHER): Payer: Medicare Other | Admitting: Neurology

## 2011-05-17 ENCOUNTER — Encounter: Payer: Self-pay | Admitting: Neurology

## 2011-05-17 VITALS — BP 142/72 | HR 80 | Wt 167.0 lb

## 2011-05-17 DIAGNOSIS — I639 Cerebral infarction, unspecified: Secondary | ICD-10-CM

## 2011-05-17 DIAGNOSIS — R413 Other amnesia: Secondary | ICD-10-CM

## 2011-05-17 DIAGNOSIS — I6350 Cerebral infarction due to unspecified occlusion or stenosis of unspecified cerebral artery: Secondary | ICD-10-CM

## 2011-05-17 NOTE — Progress Notes (Signed)
Dear Dr. Jacky Kindle,  I saw  [name] Walker in [facility] for his problem with possible memory problems causing medication non-compliance.  As you may recall, he is a 78 y.o. year old male with a history of a right subdural hematoma while on anti-coagulation as well as a subsequent history of 3 seizures remote from the subdural.  He has had several instances where his blood glucose required emergent admission to hospital.  His last incident which occurred in January 2013 you ordered an MRI brain with revealed a new right frontal infarction.  When I saw him in consultation I ordered an EEG and neuropsychological testing.  Dr. Leonides Cave felt that he did have signs of right frontal lobe dysfunction but did not favor a neurodegenerative process.  He also felt his cognition was sufficient to manage his insulin.  His wife accompanies him today.  She feels he is depressed because he does not engage in the same activities as he once did.  An example of this is golfing.  He sits and stares in his living room -  however these staring spells are interruptible.  He denies depression although he does get bored easily.  He does enjoy reading.  He does not engage with other male friends.  He has had no seizures since his initial episode of 3 spells in 12/2008. He denies problems with the Keppra although typically takes it 2 tabs at night.  He then lets on that he doesn't typically take his morning dose of any of his medications "because he forgets".  His wife has not seen any other suspicious spells.  Medical history, social history, and family history were reviewed and have not changed since the last clinic visit.  Current Outpatient Prescriptions on File Prior to Visit  Medication Sig Dispense Refill  . acetaminophen (TYLENOL) 500 MG tablet Take 1,000 mg by mouth every 6 (six) hours as needed. For pain/headache      . aspirin EC 81 MG tablet Take 81 mg by mouth at bedtime.      .  cholecalciferol (VITAMIN D) 1000 UNITS tablet Take 1,000 Units by mouth daily.      Marland Kitchen levETIRAcetam (KEPPRA) 500 MG tablet Take 500 mg by mouth daily.      Marland Kitchen lisinopril (PRINIVIL,ZESTRIL) 5 MG tablet Take 5 mg by mouth daily.      . metoprolol succinate (TOPROL-XL) 50 MG 24 hr tablet Take 50 mg by mouth daily. Take with or immediately following a meal.      . insulin aspart (NOVOLOG) 100 UNIT/ML injection Inject 0-15 Units into the skin 3 (three) times daily with meals.  1 vial  5  . insulin aspart protamine-insulin aspart (NOVOLOG 70/30) (70-30) 100 UNIT/ML injection Inject 30 Units into the skin daily with breakfast.  10 mL  3  . insulin glargine (LANTUS) 100 UNIT/ML injection Inject 40 Units into the skin at bedtime.  10 mL  3  . metFORMIN (GLUCOPHAGE) 1000 MG tablet Take 1,000 mg by mouth 2 (two) times daily with a meal.        No Known Allergies  ROS:  13 systems were reviewed are unremarkable.  Exam: . Filed Vitals:   05/17/11 1448  BP: 142/72  Pulse: 80  Weight: 167 lb (75.751 kg)    In general, thin appearing man.  Mental status:   The patient is oriented to person, place and time. Recent and remote memory are intact. Attention span and concentration are normal. Language  including repetition, naming, following commands are intact. Fund of knowledge of current and historical events, as well as vocabulary are normal.  Cranial Nerves: Pupils are equally round and reactive to light.  Extraocular movements are intact without nystagmus. Facial sensation and muscles of mastication are intact.  Hearing intact to bilateral finger rub. Tongue protrusion, uvula, palate midline.  Shoulder shrug intact  Motor:  Normal bulk and tone, no drift and 5/5 muscle strength bilaterally.  Reflexes:  Absent throughout.  Coordination:  Normal finger to nose  Gait:  Normal gait and station.  Romberg negative.  Impression/Recommendations:  1.  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  Given his wife's complaints of possible depression and really wants sounds like abulia he may benefit from a SSRI which may help the connectivity to the frontal lobes.  However, he seems relatively unwilling to do this.  However, I have said I would mention this to you to discuss. 2.  Seizure disorder - I have told him to take his Keppra bid.  If he continues to not do this I would switch him to Keppra XR.  However, this is much more expensive and luckily he has not run into any problems with his once daily dosing. 3.  ?Depression - I am not convinced he is depressed, but a trial of an SSRI might be beneficial.  I have asked him to try to make an effort to engage socially if possible. 4.  Ischemic stroke - i noticed he is not taking clopidogrel 75mg  daily as I directed.  I will have my staff call him and instruct him to switch to clopidogrel from aspirin as he had a ischemic stroke while he was on aspirin.   We will see [name] in 6  months.  Lupita Raider Modesto Charon, MD [facility],

## 2011-05-20 ENCOUNTER — Other Ambulatory Visit: Payer: Self-pay | Admitting: Neurology

## 2011-05-20 ENCOUNTER — Telehealth: Payer: Self-pay | Admitting: Neurology

## 2011-05-20 MED ORDER — CLOPIDOGREL BISULFATE 75 MG PO TABS: 75.0000 mg | ORAL_TABLET | Freq: Every day | ORAL | Status: AC

## 2011-05-20 NOTE — Telephone Encounter (Signed)
Message copied by Benay Spice on Fri May 20, 2011  9:46 AM ------      Message from: Denton Meek H      Created: Wed May 18, 2011 11:37 AM       Jan -             Just saw this patient couple of days ago.  Forgot to talk to them about the fact that he should be on Plavix instead of aspirin for stroke prevention as he had a stroke when he was on aspirin.  If you could talk to him about that and call in Plavix 75 daily and tell him to stop the aspirin that would be great.              Also, if he (or his wife) wants to try something for his mood, then I am happy to try something before he sees Dr. Jacky Kindle back.            Thanks,            [employer]

## 2011-05-20 NOTE — Telephone Encounter (Signed)
Called and spoke with the patient and the patient's wife. Will stop the asa and start Plavix. Will call in to [facility]. He was questioned about his mood and ? depression and he will think about trying a medication and call and let us know if he wants to try something. The patient says is isn't depressed. No additional concerns voiced at this time.

## 2011-08-15 ENCOUNTER — Other Ambulatory Visit: Payer: Self-pay | Admitting: Oral Surgery

## 2011-12-07 ENCOUNTER — Encounter (HOSPITAL_COMMUNITY): Payer: Self-pay | Admitting: *Deleted

## 2011-12-07 ENCOUNTER — Emergency Department (HOSPITAL_COMMUNITY): Payer: Medicare Other

## 2011-12-07 ENCOUNTER — Inpatient Hospital Stay (HOSPITAL_COMMUNITY)
Admission: EM | Admit: 2011-12-07 | Discharge: 2011-12-10 | DRG: 637 | Disposition: A | Discharge status: Skilled Nursing Facility | Payer: Medicare Other | Attending: Internal Medicine | Admitting: Internal Medicine

## 2011-12-07 DIAGNOSIS — Z9119 Patient's noncompliance with other medical treatment and regimen: Secondary | ICD-10-CM

## 2011-12-07 DIAGNOSIS — E1165 Type 2 diabetes mellitus with hyperglycemia: Secondary | ICD-10-CM | POA: Diagnosis present

## 2011-12-07 DIAGNOSIS — I6350 Cerebral infarction due to unspecified occlusion or stenosis of unspecified cerebral artery: Secondary | ICD-10-CM

## 2011-12-07 DIAGNOSIS — G9341 Metabolic encephalopathy: Secondary | ICD-10-CM | POA: Diagnosis present

## 2011-12-07 DIAGNOSIS — Z91199 Patient's noncompliance with other medical treatment and regimen due to unspecified reason: Secondary | ICD-10-CM

## 2011-12-07 DIAGNOSIS — I2510 Atherosclerotic heart disease of native coronary artery without angina pectoris: Secondary | ICD-10-CM | POA: Diagnosis present

## 2011-12-07 DIAGNOSIS — N179 Acute kidney failure, unspecified: Secondary | ICD-10-CM | POA: Diagnosis present

## 2011-12-07 DIAGNOSIS — D649 Anemia, unspecified: Secondary | ICD-10-CM

## 2011-12-07 DIAGNOSIS — F0150 Vascular dementia without behavioral disturbance: Secondary | ICD-10-CM | POA: Diagnosis present

## 2011-12-07 DIAGNOSIS — I639 Cerebral infarction, unspecified: Secondary | ICD-10-CM

## 2011-12-07 DIAGNOSIS — E1310 Other specified diabetes mellitus with ketoacidosis without coma: Principal | ICD-10-CM | POA: Diagnosis present

## 2011-12-07 DIAGNOSIS — E119 Type 2 diabetes mellitus without complications: Secondary | ICD-10-CM

## 2011-12-07 DIAGNOSIS — I10 Essential (primary) hypertension: Secondary | ICD-10-CM | POA: Diagnosis present

## 2011-12-07 DIAGNOSIS — G40309 Generalized idiopathic epilepsy and epileptic syndromes, not intractable, without status epilepticus: Secondary | ICD-10-CM | POA: Diagnosis present

## 2011-12-07 DIAGNOSIS — G9340 Encephalopathy, unspecified: Secondary | ICD-10-CM | POA: Diagnosis present

## 2011-12-07 DIAGNOSIS — N058 Unspecified nephritic syndrome with other morphologic changes: Secondary | ICD-10-CM | POA: Diagnosis present

## 2011-12-07 DIAGNOSIS — E1110 Type 2 diabetes mellitus with ketoacidosis without coma: Secondary | ICD-10-CM

## 2011-12-07 DIAGNOSIS — I672 Cerebral atherosclerosis: Secondary | ICD-10-CM | POA: Diagnosis present

## 2011-12-07 LAB — URINALYSIS, ROUTINE W REFLEX MICROSCOPIC
Bilirubin Urine: NEGATIVE
Glucose, UA: >1000 mg/dL — AB
Hgb urine dipstick: NEGATIVE
Specific Gravity, Urine: 1.027 (ref 1.005–1.030)
pH: 5.5 (ref 5.0–8.0)

## 2011-12-07 LAB — GLUCOSE, CAPILLARY
Glucose-Capillary: 242 mg/dL — ABNORMAL HIGH (ref 70–99)
Glucose-Capillary: 286 mg/dL — ABNORMAL HIGH (ref 70–99)
Glucose-Capillary: 371 mg/dL — ABNORMAL HIGH (ref 70–99)
Glucose-Capillary: 473 mg/dL — ABNORMAL HIGH (ref 70–99)

## 2011-12-07 LAB — COMPREHENSIVE METABOLIC PANEL
ALT: 10 U/L (ref 0–53)
AST: 23 U/L (ref 0–37)
CO2: 12 mEq/L — ABNORMAL LOW (ref 19–32)
Calcium: 9.2 mg/dL (ref 8.4–10.5)
GFR calc non Af Amer: 47 mL/min — ABNORMAL LOW (ref >90)
Sodium: 124 mEq/L — ABNORMAL LOW (ref 135–145)
Total Protein: 7.5 g/dL (ref 6.0–8.3)

## 2011-12-07 LAB — CBC
MCH: 31 pg (ref 26.0–34.0)
Platelets: 436 10*3/uL — ABNORMAL HIGH (ref 150–400)
RBC: 4.23 MIL/uL (ref 4.22–5.81)
WBC: 8.7 10*3/uL (ref 4.0–10.5)

## 2011-12-07 LAB — URINE MICROSCOPIC-ADD ON

## 2011-12-07 MED ORDER — ASPIRIN 81 MG PO CHEW
324.0000 mg | CHEWABLE_TABLET | Freq: Once | ORAL | Status: AC
  Administered 2011-12-07: 324 mg via ORAL
  Filled 2011-12-07: qty 4

## 2011-12-07 MED ORDER — LACTATED RINGERS IV BOLUS (SEPSIS)
1000.0000 mL | Freq: Once | INTRAVENOUS | Status: AC
  Administered 2011-12-07: 1000 mL via INTRAVENOUS

## 2011-12-07 MED ORDER — INSULIN ASPART 100 UNIT/ML ~~LOC~~ SOLN
10.0000 [IU] | Freq: Once | SUBCUTANEOUS | Status: AC
  Administered 2011-12-07: 10 [IU] via SUBCUTANEOUS
  Filled 2011-12-07: qty 1

## 2011-12-07 MED ORDER — INSULIN REGULAR HUMAN 100 UNIT/ML IJ SOLN: 10.0000 [IU] | Freq: Once | INTRAMUSCULAR | Status: DC

## 2011-12-07 MED ORDER — SODIUM CHLORIDE 0.9 % IV SOLN
Freq: Once | INTRAVENOUS | Status: AC
  Administered 2011-12-07: 22:00:00 via INTRAVENOUS

## 2011-12-07 MED ORDER — SODIUM CHLORIDE 0.9 % IV SOLN
INTRAVENOUS | Status: DC
  Administered 2011-12-07: 4.1 [IU]/h via INTRAVENOUS
  Filled 2011-12-07: qty 1

## 2011-12-07 MED ORDER — ALUM & MAG HYDROXIDE-SIMETH 200-200-20 MG/5ML PO SUSP
30.0000 mL | Freq: Once | ORAL | Status: AC
  Administered 2011-12-07: 30 mL via ORAL
  Filled 2011-12-07: qty 30

## 2011-12-07 MED ORDER — SODIUM CHLORIDE 0.9 % IV BOLUS (SEPSIS)
1000.0000 mL | Freq: Once | INTRAVENOUS | Status: AC
  Administered 2011-12-07: 1000 mL via INTRAVENOUS

## 2011-12-07 NOTE — ED Notes (Signed)
md at bedside  Pt alert and oriented x4. Respirations even and unlabored, bilateral symmetrical rise and fall of chest. Skin warm and dry. In no acute distress. Denies needs.   

## 2011-12-07 NOTE — ED Notes (Signed)
[id number]> Expected date:<BR> Expected time:<BR> Means of arrival:Ambulance<BR> Comments:<BR> hyperglycemia

## 2011-12-07 NOTE — ED Provider Notes (Signed)
History     CSN: [id number]  Arrival date & time 12/07/11  1620   First MD Initiated Contact with Patient 12/07/11 1720      Chief Complaint  Patient presents with  . Hyperglycemia    (Consider location/radiation/quality/duration/timing/severity/associated sxs/prior treatment) HPI [name] Walker is a 78 y.o. male with a long history of hypertension, or diabetes mellitus that is not well-controlled. Patient's son says that patient's blood sugars have been uncontrollable for the last 3 weeks however today started having issues of slurred speech, confusion. Patient does have history of having a subdural hematoma-recurrent on the right. If further discussions, the son says patient faces not completely symmetrical. He says he feels like he is slurring his speech, but denies any other focal weakness or numbness throughout his body. He says he's had trouble with urinary incontinence recently. This is been a gradual problem. He's been wearing depends over the last couple of weeks. Denies any dysuria, frequency, shortness of breath, chest pain. Denies any headaches or recent trauma. No productive cough fevers or chills.  Past Medical History  Diagnosis Date  . Coronary artery disease   . Diabetes mellitus   . Hypertension   . Hyperlipidemia   . Headache   . Seizure   . Arthritis   . Anemia   . Blind left eye   . Subdural hematoma     hx recurrent right subdural hematoma    Past Surgical History  Procedure Date  . Appendectomy     AGE 56  . Rhino-septoplasty 1976  . Knee arthroscopy 1981    left knee  . Retinal detachment surgery 1989    left  . Cataract extraction 1995    right eye  . Knee arthroscopy 1998    right knee  . Heart catherization 2001  . Knee surgery 2003    revision of left knee  . Penile prosthesis implant 2005  . Shoulder arthroscopy 2005, 2006    right shoulder  . Knee surgery 1998    left knee lateral release  . Replacement total knee bilateral      History reviewed. No pertinent family history.  History  Substance Use Topics  . Smoking status: Former Smoker -- 19 years    Quit date: 02/21/1966  . Smokeless tobacco: Never Used  . Alcohol Use: No     Comment: quit drinking in 2011    Review of Systems At least 10pt or greater review of systems completed and are negative except where specified in the HPI.  Allergies  Review of patient's allergies indicates no known allergies.  Home Medications   Current Outpatient Rx  Name  Route  Sig  Dispense  Refill  . ACETAMINOPHEN 500 MG PO TABS   Oral   Take 1,000 mg by mouth every 6 (six) hours as needed. For pain/headache         . VITAMIN D 1000 UNITS PO TABS   Oral   Take 1,000 Units by mouth daily.         Marland Kitchen CLOPIDOGREL BISULFATE 75 MG PO TABS   Oral   Take 1 tablet (75 mg total) by mouth daily.   30 tablet   11   . INSULIN ASPART 100 UNIT/ML [place] SOLN   Subcutaneous   Inject 0-15 Units into the skin 3 (three) times daily with meals.   1 vial   5   . INSULIN ASPART PROT & ASPART (70-30) 100 UNIT/ML [place] SUSP   Subcutaneous  Inject 30 Units into the skin daily with breakfast.   10 mL   3   . INSULIN GLARGINE 100 UNIT/ML [place] SOLN   Subcutaneous   Inject 40 Units into the skin at bedtime.   10 mL   3   . LEVETIRACETAM 500 MG PO TABS   Oral   Take 500 mg by mouth daily.         Marland Kitchen LISINOPRIL 5 MG PO TABS   Oral   Take 5 mg by mouth daily.         Marland Kitchen METFORMIN HCL 1000 MG PO TABS   Oral   Take 1,000 mg by mouth 2 (two) times daily with a meal.         . METOPROLOL SUCCINATE ER 50 MG PO TB24   Oral   Take 50 mg by mouth daily. Take with or immediately following a meal.           BP 155/79  Pulse 94  Temp 98.2 F (36.8 C) (Oral)  Resp 16  SpO2 100%  Physical Exam  PHYSICAL EXAM: VITAL SIGNS:  . Filed Vitals:   12/07/11 1629 12/07/11 1742 12/07/11 1952 12/07/11 2243  BP: 155/79  148/94 155/71  Pulse: 94  103 95  Temp: 98.2 F  (36.8 C) 98.1 F (36.7 C) 98.5 F (36.9 C)   TempSrc: Oral  Oral   Resp: 16  20   SpO2: 100%  98% 98%   CONSTITUTIONAL: Awake, oriented, appears non-toxic HENT: Atraumatic, normocephalic, oral mucosa pink and moist, airway patent. Nares patent without drainage. External ears normal. EYES: Conjunctiva clear, EOMI, PERRLA NECK: Trachea midline, non-tender, supple CARDIOVASCULAR: Normal heart rate, Normal rhythm, No murmurs, rubs, gallops PULMONARY/CHEST: Clear to auscultation, no rhonchi, wheezes, or rales. Symmetrical breath sounds. CHEST WALL: No lesions. Non-tender. ABDOMINAL: Non-distended, soft, non-tender - no rebound or guarding.  BS normal. NEUROLOGIC: [id number] fields intact.  Facial sensation equal to light touch bilaterally.  Good muscle bulk in the masseter muscle and good lateral movement of the jaw.  Facial expressions seem skewed with left-sided subtle droop, inability to puff out cheeks against pressure is slightly asymmetric smile.  Hearing grossly intact to finger rub test.  Uvula appears to pull to the right, tongue appears slightly deviated to the left. Palate elevation is symmetric with uvula Trapezius and SCM muscles are 5/5 strength bilaterally.   Strength: 5/5 strength flexors and extensors in the upper and lower extremities.  Grip strength, finger adduction/abduction 5/5. Sensation: Sensation intact distally to light touch GU: Normal male, patient has an erection during exam EXTREMITIES: No clubbing, cyanosis, or edema SKIN: Warm, Dry, No erythema, No rash  H/o SDH 1 year ago, ? Deficits,   Face not symmertryic  Today, slurring, Left palate elevation - tongue ? Slight dev to left  ED Course  CRITICAL CARE Performed by: Jones Skene Authorized by: Jones Skene Total critical care time: 45 minutes Critical care time was exclusive of separately billable procedures and treating other patients. Critical care was necessary to treat or prevent imminent or  life-threatening deterioration of the following conditions: endocrine crisis. Critical care was time spent personally by me on the following activities: evaluation of patient's response to treatment, ordering and performing treatments and interventions, pulse oximetry, re-evaluation of patient's condition, ordering and review of laboratory studies, examination of patient, development of treatment plan with patient or surrogate, obtaining history from patient or surrogate, ordering and review of radiographic studies and review of old charts.   (  including critical care time)  Date: 12/08/2011  Rate: 98  Rhythm: normal sinus rhythm  QRS Axis: normal  Intervals: normal  ST/T Wave abnormalities: normal  Conduction Disutrbances:Narrative Interpretation: unremarkable - patient's EKG looks improved from prior EKG dated 09/30/2007 where patient had flipped T waves throughout the precordium, acute and nonischemic     Labs Reviewed  CBC - Abnormal; Notable for the following:    HCT 37.8 (*)     Platelets 436 (*)     All other components within normal limits  COMPREHENSIVE METABOLIC PANEL - Abnormal; Notable for the following:    Sodium 124 (*)     Potassium 6.0 (*)     Chloride 85 (*)     CO2 12 (*)     Glucose, Bld 742 (*)     BUN 42 (*)     Creatinine, Ser 1.39 (*)     GFR calc non Af Amer 47 (*)     GFR calc Af Amer 54 (*)     All other components within normal limits  URINALYSIS, ROUTINE W REFLEX MICROSCOPIC - Abnormal; Notable for the following:    Glucose, UA >1000 (*)     Ketones, ur >80 (*)     All other components within normal limits  GLUCOSE, CAPILLARY - Abnormal; Notable for the following:    Glucose-Capillary >600 (*)     All other components within normal limits  KETONES, QUALITATIVE - Abnormal; Notable for the following:    Acetone, Bld LARGE (*)     All other components within normal limits  GLUCOSE, CAPILLARY - Abnormal; Notable for the following:    Glucose-Capillary  473 (*)     All other components within normal limits  GLUCOSE, CAPILLARY - Abnormal; Notable for the following:    Glucose-Capillary 371 (*)     All other components within normal limits  GLUCOSE, CAPILLARY - Abnormal; Notable for the following:    Glucose-Capillary 286 (*)     All other components within normal limits  GLUCOSE, CAPILLARY - Abnormal; Notable for the following:    Glucose-Capillary 242 (*)     All other components within normal limits  URINE MICROSCOPIC-ADD ON  OSMOLALITY   Ct Head Wo Contrast  12/07/2011  *RADIOLOGY REPORT*  Clinical Data: Altered mental status.  History of cerebrovascular accident and subdural hematoma  CT HEAD WITHOUT CONTRAST  Technique:  Contiguous axial images were obtained from the base of the skull through the vertex without contrast.  Comparison: The MRI brain 02/10/2011, CT head 12/04/2008  Findings: There is a craniotomy flap over the high right frontal bone.  There is a focus of low attenuation within the high right frontal lobe measuring 2.3 x 2.5 cm.  This corresponds to an infarction described on comparison brain MRI January 2013.  There is no evidence of acute intracranial hemorrhage.  No CT evidence of acute cortical infarction.  No midline shift or mass effect.  There is extensive atrophy which is diffuse in nature.  There are lacunar infarctions within the left and right basal ganglia similar to prior.  There is a metallic mesh in the roof of the oropharynx.  Paranasal sinuses are clear.  Orbits show no acute findings.  There are scleral bands.  IMPRESSION:  1.  No acute intracranial findings. 2.  Remote focal infarction in the high right frontal lobe. 3.  Extensive atrophy and microvascular disease.  4.  Lacunar infarctions in the basal ganglia.   Original Report Authenticated By: Roseanne Reno  Amil Amen, M.D.      1. DKA, type 2   2. Anemia, unspecified   3. CVA (cerebral infarction)   4. Type II or unspecified type diabetes mellitus without  mention of complication, not stated as uncontrolled       MDM  DARRYAL GOTTE is a 78 y.o. male with history of chronic subdural hematomas as well as uncontrolled diabetes mellitus presents with altered male status, slurring, palatal asymmetry. The patient most likely has a metabolic encephalopathy-however will rule out recurrence of subdural hematoma, patient may also been having a CVA, he is examined from TPA therapy because of his prior subdural hematoma.  My concern is that the patient is either in [facility] or DKA.   Patient's glucose is 742, patient's potassium is 6.0 and his bicarbonate is 12 giving him an anion gap of 27. BUN is 42, creatinine is 1.39. Aggressively started fluid rehydration. Patient has no changes on EKG consistent with hyperkalemia.  Patient in diabetic ketoacidosis, started insulin drip in the emergency department. Discussed with hospitalist for admission.  The patient's CT does not show any acute bleed, does have lacunar infarctions in the basal ganglia with extensive atrophy and microvascular disease. No acute findings.      Jones Skene, MD 12/08/11 9629

## 2011-12-07 NOTE — ED Notes (Addendum)
Per ems pt is from home.pt lives with wife. Pt hasnt been feeling good for a couple days, general mailaise, exacerbated thirst. Last night pt started having slurred speech, pt son came over today. Check blood sugar, said high, and son called ems. Pt has no other signs of neuro deficits other than slurred speech. Hand grips and leg pushes equal.  Slurred speech seems more like a "tic" tremor present in face.  Pt is normally incontinent  20 g L AC, 500 ml NS infused

## 2011-12-07 NOTE — H&P (Signed)
[facility]                                                                                    Patient Demographics  [name] Walker, is a 78 y.o. male  CSN: [id number]  MRN: [id number]  DOB - November 04, 1931  Admit Date - 12/07/2011  Outpatient Primary MD for the patient is ARONSON,RICHARD A, MD   With History of -  Past Medical History  Diagnosis Date  . Coronary artery disease   . Diabetes mellitus   . Hypertension   . Hyperlipidemia   . Headache   . Seizure   . Arthritis   . Anemia   . Blind left eye   . Subdural hematoma     hx recurrent right subdural hematoma      Past Surgical History  Procedure Date  . Appendectomy     AGE 65  . Rhino-septoplasty 1976  . Knee arthroscopy 1981    left knee  . Retinal detachment surgery 1989    left  . Cataract extraction 1995    right eye  . Knee arthroscopy 1998    right knee  . Heart catherization 2001  . Knee surgery 2003    revision of left knee  . Penile prosthesis implant 2005  . Shoulder arthroscopy 2005, 2006    right shoulder  . Knee surgery 1998    left knee lateral release  . Replacement total knee bilateral     in for   Chief Complaint  Patient presents with  . Hyperglycemia     HPI  [name] Walker  is a 78 y.o. male, with past medical history significant for diabetes mellitus type 2 on insulin presenting with 2 days history of not feeling good his been having increasing thirst and polyuria his family noticed slurred speech and his son came to examine him multiple times and found that his blood sugar has always been elevated. Patient denies any chest pain shortness of breath. Reports some nausea but no vomiting   Review of Systems    In addition to the HPI above, No Fever-chills, No Headache, No changes with Vision or hearing, No problems swallowing food or Liquids, No Chest pain, Cough or Shortness of Breath, No Abdominal pain, mild nausea but no vomiting, Bowel movements are  regular, No Blood in stool or Urine, No dysuria, No new skin rashes or bruises, No new joints pains-aches,  No new weakness, tingling, numbness in any extremity, No recent weight gain or loss, Noted to polyuria and polydipsia No significant Mental Stressors.  A full 10 point Review of Systems was done, except as stated above, all other Review of Systems were negative.   Social History History  Substance Use Topics  . Smoking status: Former Smoker -- 19 years    Quit date: 02/21/1966  . Smokeless tobacco: Never Used  . Alcohol Use: No     Comment: quit drinking in 2011    Family History History reviewed. No pertinent family history.  Prior to Admission medications   Medication Sig Start Date End Date Taking? Authorizing Provider  acetaminophen (TYLENOL) 500 MG tablet Take 1,000 mg by mouth every 6 (  six) hours as needed. For pain/headache   Yes Historical Provider, MD  cholecalciferol (VITAMIN D) 1000 UNITS tablet Take 1,000 Units by mouth daily.   Yes Historical Provider, MD  clopidogrel (PLAVIX) 75 MG tablet Take 1 tablet (75 mg total) by mouth daily. 05/20/11 05/19/12 Yes Milas Gain, MD  insulin aspart (NOVOLOG) 100 UNIT/ML injection Inject 0-15 Units into the skin 3 (three) times daily with meals. 02/28/11 02/28/12 Yes Minda Meo, MD  insulin aspart protamine-insulin aspart (NOVOLOG 70/30) (70-30) 100 UNIT/ML injection Inject 30 Units into the skin daily with breakfast. 02/28/11 02/28/12 Yes Minda Meo, MD  insulin glargine (LANTUS) 100 UNIT/ML injection Inject 40 Units into the skin at bedtime. 02/28/11 02/28/12 Yes Minda Meo, MD  levETIRAcetam (KEPPRA) 500 MG tablet Take 500 mg by mouth daily.   Yes Historical Provider, MD  lisinopril (PRINIVIL,ZESTRIL) 5 MG tablet Take 5 mg by mouth daily.   Yes Historical Provider, MD  metFORMIN (GLUCOPHAGE) 1000 MG tablet Take 1,000 mg by mouth 2 (two) times daily with a meal.   Yes Historical Provider, MD  metoprolol  succinate (TOPROL-XL) 50 MG 24 hr tablet Take 50 mg by mouth daily. Take with or immediately following a meal.   Yes Historical Provider, MD    No Known Allergies  Physical Exam  Vitals  Blood pressure 148/94, pulse 103, temperature 98.5 F (36.9 C), temperature source Oral, resp. rate 20, SpO2 98.00%.   1. General elderly male lying in bed. In no acute distress, looks chronically ill  2. patient confused.  3. No F.N deficits, ALL C.Nerves Intact, Strength 5/5 all 4 extremities, Sensation intact all 4 extremities, Plantars down going. Tremors noted on the left face. Suggestive of Parkinson's.  4. Ears and Eyes appear Normal, Conjunctivae clear, PERRLA. Moist Oral Mucosa. Right head scar  5. Supple Neck, No JVD, No cervical lymphadenopathy appriciated, No Carotid Bruits.  6. Symmetrical Chest wall movement, Good air movement bilaterally, CTAB.  7. RRR, No Gallops, Rubs or Murmurs, No Parasternal Heave.  8. Positive Bowel Sounds, Abdomen Soft, Non tender, No organomegaly appriciated,No rebound -guarding or rigidity.  9.  No Cyanosis, decreased skin turgor No Skin Rash or Bruise.  10. Good muscle tone,  joints appear normal , no effusions, Normal ROM.  11. No Palpable Lymph Nodes in Neck or Axilae  Data Review  CBC  Lab 12/07/11 1708  WBC 8.7  HGB 13.1  HCT 37.8*  PLT 436*  MCV 89.4  MCH 31.0  MCHC 34.7  RDW 13.5  LYMPHSABS --  MONOABS --  EOSABS --  BASOSABS --  BANDABS --   ------------------------------------------------------------------------------------------------------------------  Chemistries   Lab 12/07/11 1708  NA 124*  K 6.0*  CL 85*  CO2 12*  GLUCOSE 742*  BUN 42*  CREATININE 1.39*  CALCIUM 9.2  MG --  AST 23  ALT 10  ALKPHOS 107  BILITOT 0.3    ------------------------------------------------------------------------------------------------------------------     ---------------------------------------------------------------------------------------------------------------  Urinalysis    Component Value Date/Time   COLORURINE YELLOW 12/07/2011 1705   APPEARANCEUR CLEAR 12/07/2011 1705   LABSPEC 1.027 12/07/2011 1705   PHURINE 5.5 12/07/2011 1705   GLUCOSEU >1000* 12/07/2011 1705   HGBUR NEGATIVE 12/07/2011 1705   BILIRUBINUR NEGATIVE 12/07/2011 1705   KETONESUR >80* 12/07/2011 1705   PROTEINUR NEGATIVE 12/07/2011 1705   UROBILINOGEN 0.2 12/07/2011 1705   NITRITE NEGATIVE 12/07/2011 1705   LEUKOCYTESUR NEGATIVE 12/07/2011 1705    ----------------------------------------------------------------------------------------------------------------      Imaging results:  Ct Head Wo Contrast  12/07/2011  *RADIOLOGY REPORT*  Clinical Data: Altered mental status.  History of cerebrovascular accident and subdural hematoma  CT HEAD WITHOUT CONTRAST  Technique:  Contiguous axial images were obtained from the base of the skull through the vertex without contrast.  Comparison: The MRI brain 02/10/2011, CT head 12/04/2008  Findings: There is a craniotomy flap over the high right frontal bone.  There is a focus of low attenuation within the high right frontal lobe measuring 2.3 x 2.5 cm.  This corresponds to an infarction described on comparison brain MRI January 2013.  There is no evidence of acute intracranial hemorrhage.  No CT evidence of acute cortical infarction.  No midline shift or mass effect.  There is extensive atrophy which is diffuse in nature.  There are lacunar infarctions within the left and right basal ganglia similar to prior.  There is a metallic mesh in the roof of the oropharynx.  Paranasal sinuses are clear.  Orbits show no acute findings.  There are scleral bands.  IMPRESSION:  1.  No acute intracranial findings. 2.  Remote  focal infarction in the high right frontal lobe. 3.  Extensive atrophy and microvascular disease.  4.  Lacunar infarctions in the basal ganglia.   Original Report Authenticated By: Genevive Bi, M.D.     My personal review of EKG: Rhythm NSR, no Acute ST changes    Assessment & Plan  Active Problems:  DKA, type 2    1. DKA; the patient was started on IV fluids in the emergency room he was also started on IV insulin. His potassium was elevated his urine output is minimal some no potassium will be added at this time until his urine picks up and his potassium drops. I stopped his metformin. Antibiotics given after cultures were taken. 2. confusion probably due to his DKA  3. Hypertension, history of coronary artery disease, hyperlipidemia, history of seizures, history of arthritis anemia and history of subdural hematoma.   DVT Prophylaxis  SCDs (patient has a history of subdural hematoma) AM Labs Ordered, also please review Full Orders  Family Communication: Admission, patients condition and plan of care including tests being ordered have been discussed with the patient  who indicate understanding and agree with the plan and Code Status.  Code Status full  Disposition Plan: Home with home health Time spent in minutes : 40  Condition GUARDED

## 2011-12-08 ENCOUNTER — Encounter (HOSPITAL_COMMUNITY): Payer: Self-pay

## 2011-12-08 DIAGNOSIS — E119 Type 2 diabetes mellitus without complications: Secondary | ICD-10-CM

## 2011-12-08 DIAGNOSIS — I2510 Atherosclerotic heart disease of native coronary artery without angina pectoris: Secondary | ICD-10-CM | POA: Diagnosis present

## 2011-12-08 DIAGNOSIS — I10 Essential (primary) hypertension: Secondary | ICD-10-CM | POA: Diagnosis present

## 2011-12-08 DIAGNOSIS — G9340 Encephalopathy, unspecified: Secondary | ICD-10-CM | POA: Diagnosis present

## 2011-12-08 LAB — BASIC METABOLIC PANEL
BUN: 28 mg/dL — ABNORMAL HIGH (ref 6–23)
BUN: 24 mg/dL — ABNORMAL HIGH (ref 6–23)
BUN: 21 mg/dL (ref 6–23)
CO2: 21 mEq/L (ref 19–32)
CO2: 22 mEq/L (ref 19–32)
Calcium: 8.1 mg/dL — ABNORMAL LOW (ref 8.4–10.5)
Calcium: 8.3 mg/dL — ABNORMAL LOW (ref 8.4–10.5)
Chloride: 105 mEq/L (ref 96–112)
Creatinine, Ser: 1.09 mg/dL (ref 0.50–1.35)
Creatinine, Ser: 0.92 mg/dL (ref 0.50–1.35)
Creatinine, Ser: 0.93 mg/dL (ref 0.50–1.35)
Creatinine, Ser: 0.94 mg/dL (ref 0.50–1.35)
GFR calc Af Amer: 73 mL/min — ABNORMAL LOW (ref >90)
GFR calc Af Amer: >90 mL/min (ref >90)
GFR calc non Af Amer: 78 mL/min — ABNORMAL LOW (ref >90)
GFR calc non Af Amer: 78 mL/min — ABNORMAL LOW (ref >90)
Glucose, Bld: 145 mg/dL — ABNORMAL HIGH (ref 70–99)
Glucose, Bld: 151 mg/dL — ABNORMAL HIGH (ref 70–99)

## 2011-12-08 LAB — GLUCOSE, CAPILLARY
Glucose-Capillary: 141 mg/dL — ABNORMAL HIGH (ref 70–99)
Glucose-Capillary: 158 mg/dL — ABNORMAL HIGH (ref 70–99)
Glucose-Capillary: 169 mg/dL — ABNORMAL HIGH (ref 70–99)
Glucose-Capillary: 124 mg/dL — ABNORMAL HIGH (ref 70–99)
Glucose-Capillary: 305 mg/dL — ABNORMAL HIGH (ref 70–99)
Glucose-Capillary: 263 mg/dL — ABNORMAL HIGH (ref 70–99)

## 2011-12-08 LAB — CBC
HCT: 30.6 % — ABNORMAL LOW (ref 39.0–52.0)
MCHC: 35 g/dL (ref 30.0–36.0)
RDW: 13.5 % (ref 11.5–15.5)

## 2011-12-08 LAB — OSMOLALITY: Osmolality: 335 mOsm/kg — ABNORMAL HIGH (ref 275–300)

## 2011-12-08 LAB — HEMOGLOBIN A1C: Hgb A1c MFr Bld: 14.8 % — ABNORMAL HIGH (ref <5.7)

## 2011-12-08 LAB — KETONES, QUALITATIVE

## 2011-12-08 MED ORDER — ACETAMINOPHEN 325 MG PO TABS
650.0000 mg | ORAL_TABLET | Freq: Four times a day (QID) | ORAL | Status: DC | PRN
  Administered 2011-12-08: 650 mg via ORAL
  Filled 2011-12-08: qty 2

## 2011-12-08 MED ORDER — INSULIN REGULAR HUMAN 100 UNIT/ML IJ SOLN
INTRAMUSCULAR | Status: DC
  Administered 2011-12-08: 2.4 [IU]/h via INTRAVENOUS
  Filled 2011-12-08: qty 1

## 2011-12-08 MED ORDER — LEVOFLOXACIN IN D5W 500 MG/100ML IV SOLN
500.0000 mg | INTRAVENOUS | Status: DC
  Administered 2011-12-08: 500 mg via INTRAVENOUS
  Filled 2011-12-08: qty 100

## 2011-12-08 MED ORDER — SODIUM CHLORIDE 0.45 % IV SOLN: INTRAVENOUS | Status: DC

## 2011-12-08 MED ORDER — OXYCODONE HCL 5 MG PO TABS: 5.0000 mg | ORAL_TABLET | ORAL | Status: DC | PRN

## 2011-12-08 MED ORDER — ALUM & MAG HYDROXIDE-SIMETH 200-200-20 MG/5ML PO SUSP
30.0000 mL | Freq: Four times a day (QID) | ORAL | Status: DC | PRN
  Filled 2011-12-08: qty 30

## 2011-12-08 MED ORDER — DEXTROSE 50 % IV SOLN: 25.0000 mL | INTRAVENOUS | Status: DC | PRN

## 2011-12-08 MED ORDER — SODIUM CHLORIDE 0.9 % IV SOLN: INTRAVENOUS | Status: AC

## 2011-12-08 MED ORDER — LORAZEPAM 2 MG/ML IJ SOLN
0.5000 mg | INTRAMUSCULAR | Status: DC | PRN
  Administered 2011-12-08 – 2011-12-09 (×2): 0.5 mg via INTRAVENOUS
  Filled 2011-12-08 (×2): qty 1

## 2011-12-08 MED ORDER — ZOLPIDEM TARTRATE 5 MG PO TABS: 5.0000 mg | ORAL_TABLET | Freq: Every evening | ORAL | Status: DC | PRN

## 2011-12-08 MED ORDER — CLOPIDOGREL BISULFATE 75 MG PO TABS
75.0000 mg | ORAL_TABLET | Freq: Every day | ORAL | Status: DC
  Administered 2011-12-08 – 2011-12-10 (×3): 75 mg via ORAL
  Filled 2011-12-08 (×3): qty 1

## 2011-12-08 MED ORDER — INSULIN ASPART 100 UNIT/ML ~~LOC~~ SOLN
0.0000 [IU] | Freq: Three times a day (TID) | SUBCUTANEOUS | Status: DC
  Administered 2011-12-08: 11 [IU] via SUBCUTANEOUS
  Administered 2011-12-08: 8 [IU] via SUBCUTANEOUS
  Administered 2011-12-09: 5 [IU] via SUBCUTANEOUS
  Administered 2011-12-09: 11 [IU] via SUBCUTANEOUS
  Administered 2011-12-09 – 2011-12-10 (×2): 5 [IU] via SUBCUTANEOUS

## 2011-12-08 MED ORDER — LISINOPRIL 5 MG PO TABS
5.0000 mg | ORAL_TABLET | Freq: Every day | ORAL | Status: DC
  Administered 2011-12-08 – 2011-12-10 (×3): 5 mg via ORAL
  Filled 2011-12-08 (×3): qty 1

## 2011-12-08 MED ORDER — LEVETIRACETAM 500 MG PO TABS
500.0000 mg | ORAL_TABLET | Freq: Every day | ORAL | Status: DC
  Administered 2011-12-08 – 2011-12-10 (×3): 500 mg via ORAL
  Filled 2011-12-08 (×3): qty 1

## 2011-12-08 MED ORDER — SODIUM CHLORIDE 0.9 % IV SOLN
INTRAVENOUS | Status: DC
  Administered 2011-12-08: 23:00:00 via INTRAVENOUS

## 2011-12-08 MED ORDER — DEXTROSE-NACL 5-0.45 % IV SOLN
INTRAVENOUS | Status: DC
Start: 2011-12-08 — End: 2011-12-08
  Administered 2011-12-08: 125 mL/h via INTRAVENOUS

## 2011-12-08 MED ORDER — VITAMIN D3 25 MCG (1000 UNIT) PO TABS
1000.0000 [IU] | ORAL_TABLET | Freq: Every day | ORAL | Status: DC
  Administered 2011-12-08 – 2011-12-10 (×3): 1000 [IU] via ORAL
  Filled 2011-12-08 (×3): qty 1

## 2011-12-08 MED ORDER — INSULIN ASPART 100 UNIT/ML ~~LOC~~ SOLN: 0.0000 [IU] | Freq: Three times a day (TID) | SUBCUTANEOUS | Status: DC

## 2011-12-08 MED ORDER — ONDANSETRON HCL 4 MG/2ML IJ SOLN: 4.0000 mg | Freq: Four times a day (QID) | INTRAMUSCULAR | Status: DC | PRN

## 2011-12-08 MED ORDER — INSULIN GLARGINE 100 UNIT/ML ~~LOC~~ SOLN
30.0000 [IU] | Freq: Every day | SUBCUTANEOUS | Status: DC
  Administered 2011-12-08: 30 [IU] via SUBCUTANEOUS

## 2011-12-08 MED ORDER — ONDANSETRON HCL 4 MG PO TABS: 4.0000 mg | ORAL_TABLET | Freq: Four times a day (QID) | ORAL | Status: DC | PRN

## 2011-12-08 MED ORDER — INSULIN ASPART PROT & ASPART (70-30 MIX) 100 UNIT/ML ~~LOC~~ SUSP
30.0000 [IU] | Freq: Every day | SUBCUTANEOUS | Status: DC
  Filled 2011-12-08: qty 3

## 2011-12-08 MED ORDER — METOPROLOL SUCCINATE ER 50 MG PO TB24
50.0000 mg | ORAL_TABLET | Freq: Every day | ORAL | Status: DC
  Administered 2011-12-08 – 2011-12-10 (×3): 50 mg via ORAL
  Filled 2011-12-08 (×3): qty 1

## 2011-12-08 MED ORDER — INSULIN ASPART PROT & ASPART (70-30 MIX) 100 UNIT/ML ~~LOC~~ SUSP
20.0000 [IU] | Freq: Two times a day (BID) | SUBCUTANEOUS | Status: DC
  Administered 2011-12-08 – 2011-12-10 (×4): 20 [IU] via SUBCUTANEOUS

## 2011-12-08 NOTE — Progress Notes (Addendum)
[facility] PROGRESS NOTE  [name] Walker ZOX:[id number] DOB: Sep 13, 1931 DOA: 12/07/2011 PCP: Minda Meo, MD  Assessment/Plan: 1. Metabolic encephalopathy: secondary to hyperglycemia and DKA; now that his blood sugar is well controlled his mental status is back to normal. Will monitor. 2. DKA: anion gap is close; bicarb normal and CBG's in 120's. No sings or source of infection; no telemetry or EKG abnormality and negative troponin. Most likely related to diet indiscretion, medication compliance and/or needs of further insulin adjustments. Will check A1C, resume 70/30 and SSI. Further medication adjustments per Dr. Jacky Kindle. 3. DM type 2: uncontrolled with BS >600 on admission. Will continue SSI and resume 70/30, but BID. Will check A1C and provide further adjustments base on CBG's range. 4. HTN: stable; will continue current medication regimen (lisinopril and metoprolol) 5. Hx of seizure: no seizure activities. Will continue keppra 6. CAD: no CP or SOB; telemetry w/o abnormalities neg troponin. Continue ASA, continue B-blocker and plavix. 7. ARF: pre-renal in nature and most likely associated with dehydration and DKA. After IVF's his renal function is back to normal. Will monitor.  DVT: SCD's  Code Status: Full Family Communication: no family at bedside Disposition Plan: home when medically stable; will transferred to telemetry floor.   Procedures:  CT head (no acute intracranial abnormalities)  Antibiotics:  Levaquin 12/07/11 (X1 dose); discontinue since no signs, symptoms or source for infection identified.  HPI/Subjective: Patient now AAOX3; hungry and in no acute distress. Denies CP, SOB and any other complaints. Afebrile  Objective: Filed Vitals:   12/08/11 0100 12/08/11 0335 12/08/11 0400 12/08/11 0800  BP: 141/71 144/73  150/81  Pulse: 96 83  85  Temp:   98.4 F (36.9 C) 98.1 F (36.7 C)  TempSrc:   Oral Oral  Resp: 17 12  24   SpO2: 96% 99%  96%     Intake/Output Summary (Last 24 hours) at 12/08/11 0937 Last data filed at 12/08/11 0800  Gross per 24 hour  Intake  991.5 ml  Output      0 ml  Net  991.5 ml    Exam:   General:  AAOX3, NAD, afebrile  Cardiovascular: S1 and S2; no rubs or gallops  Respiratory: CTA bilaterally  Abdomen: soft, NT, ND, positive BS  Extremities: no edema or cyanosis  Neuro: CN intact; non focal deficit  Data Reviewed: Basic Metabolic Panel:  Lab 12/08/11 8119 12/08/11 0512 12/08/11 0358 12/08/11 0200 12/07/11 1708  NA 136 135 136 138 124*  K 3.8 4.2 3.6 3.6 6.0*  CL 105 105 104 105 85*  CO2 22 22 22 21  12*  GLUCOSE 138* 151* 145* 141* 742*  BUN 21 24* 25* 28* 42*  CREATININE 0.94 0.93 0.92 1.09 1.39*  CALCIUM 8.1* 8.3* 8.1* 8.2* 9.2  MG -- -- -- -- --  PHOS -- -- -- -- --   Liver Function Tests:  Lab 12/07/11 1708  AST 23  ALT 10  ALKPHOS 107  BILITOT 0.3  PROT 7.5  ALBUMIN 3.6   CBC:  Lab 12/08/11 0200 12/07/11 1708  WBC 7.0 8.7  NEUTROABS -- --  HGB 10.7* 13.1  HCT 30.6* 37.8*  MCV 86.9 89.4  PLT 340 436*   CBG:  Lab 12/08/11 0800 12/08/11 0653 12/08/11 0541 12/08/11 0435 12/08/11 0333  GLUCAP 124* 169* 174* 158* 160*    Recent Results (from the past 240 hour(s))  MRSA PCR SCREENING     Status: Normal   Collection Time   12/08/11  1:38 AM  Component Value Range Status Comment   MRSA by PCR NEGATIVE  NEGATIVE Final      Studies: Ct Head Wo Contrast  12/07/2011  *RADIOLOGY REPORT*  Clinical Data: Altered mental status.  History of cerebrovascular accident and subdural hematoma  CT HEAD WITHOUT CONTRAST  Technique:  Contiguous axial images were obtained from the base of the skull through the vertex without contrast.  Comparison: The MRI brain 02/10/2011, CT head 12/04/2008  Findings: There is a craniotomy flap over the high right frontal bone.  There is a focus of low attenuation within the high right frontal lobe measuring 2.3 x 2.5 cm.  This corresponds  to an infarction described on comparison brain MRI January 2013.  There is no evidence of acute intracranial hemorrhage.  No CT evidence of acute cortical infarction.  No midline shift or mass effect.  There is extensive atrophy which is diffuse in nature.  There are lacunar infarctions within the left and right basal ganglia similar to prior.  There is a metallic mesh in the roof of the oropharynx.  Paranasal sinuses are clear.  Orbits show no acute findings.  There are scleral bands.  IMPRESSION:  1.  No acute intracranial findings. 2.  Remote focal infarction in the high right frontal lobe. 3.  Extensive atrophy and microvascular disease.  4.  Lacunar infarctions in the basal ganglia.   Original Report Authenticated By: Genevive Bi, M.D.     Scheduled Meds:   . [COMPLETED] sodium chloride   Intravenous Once  . [COMPLETED] alum & mag hydroxide-simeth  30 mL Oral Once  . [COMPLETED] aspirin  324 mg Oral Once  . cholecalciferol  1,000 Units Oral Daily  . clopidogrel  75 mg Oral Daily  . insulin aspart  0-15 Units Subcutaneous TID WC  . [COMPLETED] insulin aspart  10 Units Subcutaneous Once  . insulin aspart protamine-insulin aspart  20 Units Subcutaneous BID WC  . [COMPLETED] lactated ringers  1,000 mL Intravenous Once  . levETIRAcetam  500 mg Oral Daily  . lisinopril  5 mg Oral Daily  . metoprolol succinate  50 mg Oral Daily  . [COMPLETED] sodium chloride  1,000 mL Intravenous Once  . [COMPLETED] sodium chloride  1,000 mL Intravenous Once  . [DISCONTINUED] insulin aspart  0-15 Units Subcutaneous TID WC  . [DISCONTINUED] insulin aspart protamine-insulin aspart  30 Units Subcutaneous Q breakfast  . [DISCONTINUED] insulin glargine  30 Units Subcutaneous Daily  . [DISCONTINUED] insulin regular  10 Units Subcutaneous Once  . [DISCONTINUED] levofloxacin (LEVAQUIN) IV  500 mg Intravenous Q24H   Continuous Infusions:   . [EXPIRED] sodium chloride    . sodium chloride    . [DISCONTINUED]  sodium chloride    . [DISCONTINUED] dextrose 5 % and 0.45% NaCl 125 mL/hr (12/08/11 0207)  . [DISCONTINUED] insulin (NOVOLIN-R) infusion 4.8 Units/hr (12/08/11 0056)  . [DISCONTINUED] insulin (NOVOLIN-R) infusion 1.3 mL/hr at 12/08/11 0800    Time spent: > 30 minutes    Shayleigh Bouldin  [facility] Pager [phone]. If 8PM-8AM, please contact night-coverage at [email or web address], password [facility] 12/08/2011, 9:37 AM  LOS: 1 day

## 2011-12-08 NOTE — Progress Notes (Signed)
Inpatient Diabetes Program Recommendations  AACE/ADA: New Consensus Statement on Inpatient Glycemic Control (2013)  Target Ranges:  Prepandial:   less than 140 mg/dL      Peak postprandial:   less than 180 mg/dL (1-2 hours)      Critically ill patients:  140 - 180 mg/dL   Reason for Visit: DKA  78 y.o. male with a long history of hypertension, or diabetes mellitus that is not well-controlled. Patient's son says that patient's blood sugars have been uncontrollable for the last 3 weeks however today started having issues of slurred speech, confusion. Patient in diabetic ketoacidosis, started insulin drip in the emergency department.  DKA orders initiated and CBGs met criteria for discontinuation of drip with anion gap closed.  Lantus 30 units given 1- 2 hours prior to discontinuing drip.    Results for LACARLOS, RESPASS (MRN [id number]) as of 12/08/2011 16:48  Ref. Range 12/08/2011 07:30  Sodium Latest Range: 135-145 mEq/L 136  Potassium Latest Range: 3.5-5.1 mEq/L 3.8  Chloride Latest Range: 96-112 mEq/L 105  CO2 Latest Range: 19-32 mEq/L 22  Mean Plasma Glucose Latest Range: <117 mg/dL 409 (H)  BUN Latest Range: 6-23 mg/dL 21  Creatinine Latest Range: 0.50-1.35 mg/dL 8.11  Calcium Latest Range: 8.4-10.5 mg/dL 8.1 (L)  GFR calc non Af Amer Latest Range: >90 mL/min 78 (L)  GFR calc Af Amer Latest Range: >90 mL/min >90  Glucose Latest Range: 70-99 mg/dL 914 (H)  Hemoglobin N8G Latest Range: <5.7 % 14.8 (H)  Results for CLYDELL, MAHANNAH (MRN [id number]) as of 12/08/2011 16:48  Ref. Range 12/08/2011 07:30  Hemoglobin A1C Latest Range: <5.7 % 14.8 (H)  Results for ESTA, RANFT (MRN [id number]) as of 12/08/2011 16:48  Ref. Range 12/08/2011 04:35 12/08/2011 05:41 12/08/2011 06:53 12/08/2011 08:00 12/08/2011 11:27  Glucose-Capillary Latest Range: 70-99 mg/dL 528 (H) 413 (H) 244 (H) 124 (H) 305 (H)   Inpatient Diabetes Program Recommendations Correction (SSI): Add HS coverage  Note: Will  need adjustment in diabetes meds prior to discharge with HgbA1C of 14.8%. Home meds:  Metformin 1000 mg bid, Lantus 40 units QHS and 70/30 30 units QAM.  Will continue to follow.

## 2011-12-08 NOTE — Progress Notes (Signed)
Utilization review completed.  

## 2011-12-09 LAB — BASIC METABOLIC PANEL
CO2: 20 mEq/L (ref 19–32)
Calcium: 8.2 mg/dL — ABNORMAL LOW (ref 8.4–10.5)
Creatinine, Ser: 1.05 mg/dL (ref 0.50–1.35)
GFR calc Af Amer: 76 mL/min — ABNORMAL LOW (ref >90)
GFR calc non Af Amer: 66 mL/min — ABNORMAL LOW (ref >90)

## 2011-12-09 LAB — GLUCOSE, CAPILLARY: Glucose-Capillary: 310 mg/dL — ABNORMAL HIGH (ref 70–99)

## 2011-12-09 LAB — URINE CULTURE

## 2011-12-09 MED ORDER — INSULIN ASPART 100 UNIT/ML ~~LOC~~ SOLN: 0.0000 [IU] | Freq: Three times a day (TID) | SUBCUTANEOUS | Status: DC

## 2011-12-09 NOTE — Progress Notes (Signed)
Assumed care of patient from previous RN for the remainder of the shift.  Reviewed patient's status including telemetry noting no significant changes from previous RN's shift assessment.  Will document if changes are noted.  Will continue to monitor patient closely.  

## 2011-12-09 NOTE — Progress Notes (Signed)
Clinical Social Work Department BRIEF PSYCHOSOCIAL ASSESSMENT 12/09/2011  Patient:  [name] Walker, [name] Walker     Account Number:  [id number]     Admit date:  12/07/2011  Clinical Social Worker:  Orpah Greek  Date/Time:  12/09/2011 10:50 AM  Referred by:  Physician  Date Referred:  12/09/2011 Referred for  SNF Placement   Other Referral:   Interview type:  Family Other interview type:    PSYCHOSOCIAL DATA Living Status:  WIFE Admitted from facility:   Level of care:   Primary support name:  Riggins Cisek (son) cell#: [phone] Primary support relationship to patient:  CHILD, ADULT Degree of support available:   good    CURRENT CONCERNS Current Concerns  Post-Acute Placement   Other Concerns:    SOCIAL WORK ASSESSMENT / PLAN CSW spoke with patient's son, Stalin Gruenberg (cell#: [phone]) re: discharge planning. Patient was admitted from home with his wife, but will need SNF/ALF at discharge.   Assessment/plan status:  Information/Referral to [facility] Other assessment/ plan:   Information/referral to community resources:   CSW completed FL2 and faxed information out to [facility] - CSW confirmed with Toniann Fail @ [facility] that they will have a bed available for him tomorrow. Dr. Jacky Kindle made aware.    PATIENT'S/FAMILY'S RESPONSE TO PLAN OF CARE: Patient's son was very pleased to hear that [facility] had a bed available, as they had another family member there in the past with a good experience.        Unice Bailey, LCSW [facility] Clinical Social Worker cell #: [phone]

## 2011-12-09 NOTE — Discharge Summary (Signed)
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 For pain/headache      cholecalciferol 1000 UNITS tablet   Commonly known as: VITAMIN D   Take 1,000 Units by mouth daily.      clopidogrel 75 MG tablet   Commonly known as: PLAVIX   Take 1 tablet (75 mg total) by mouth daily.      insulin aspart 100 UNIT/ML injection   Commonly known as: novoLOG   Inject 0-15 Units into the skin 3 (three) times daily with meals.      insulin glargine 100 UNIT/ML injection   Commonly known as: LANTUS   Inject 40 Units into the skin at bedtime.      levETIRAcetam 500 MG tablet   Commonly known as: KEPPRA   Take 500 mg by mouth daily.      lisinopril 5 MG tablet   Commonly known as: PRINIVIL,ZESTRIL   Take 5 mg by mouth daily.      metFORMIN 1000 MG tablet   Commonly known as: GLUCOPHAGE   Take 1,000 mg by mouth 2 (two) times daily with a meal.      metoprolol succinate 50 MG 24 hr tablet   Commonly known as: TOPROL-XL   Take 50 mg by mouth daily. Take with or immediately following  a meal.        Hospital Procedures: Ct Head Wo Contrast  12/07/2011  *RADIOLOGY REPORT*  Clinical Data: Altered mental status.  History of cerebrovascular accident and subdural hematoma  CT HEAD WITHOUT CONTRAST  Technique:  Contiguous axial images were obtained from the base of the skull through the vertex without contrast.  Comparison: The MRI brain 02/10/2011, CT head 12/04/2008  Findings: There is a craniotomy flap over the high right frontal bone.  There is a focus of low attenuation within the high right frontal lobe measuring 2.3 x 2.5 cm.  This corresponds to an infarction described on comparison brain MRI January 2013.  There is no evidence of acute intracranial hemorrhage.  No CT evidence of acute cortical infarction.  No midline shift or mass effect.  There is extensive atrophy which is diffuse in nature.  There are lacunar infarctions within the left and right basal ganglia similar to prior.  There is a metallic mesh in the roof of the oropharynx.  Paranasal sinuses are clear.  Orbits show no acute findings.  There are scleral bands.  IMPRESSION:  1.  No acute intracranial findings. 2.  Remote focal infarction in the high right frontal lobe. 3.  Extensive atrophy and microvascular disease.  4.  Lacunar infarctions  in the basal ganglia.   Original Report Authenticated By: Genevive Bi, M.D.     History of Present Illness:  [name] Walker is a 78 y.o. male, with past medical history significant for diabetes mellitus type 2 on insulin presenting with 2 days history of not feeling good his been having increasing thirst and polyuria his family noticed slurred speech and his son came to examine him multiple times and found that his blood sugar has always been elevated. Patient denies any chest pain shortness of breath. Reports some nausea but no vomiting he should be noted that this history and admission was done by the hospitalist and we're appreciative that we soon care on the second  hospital day. It should also be noted that this decline was a multi-day multiweek lack of compliance. This is been a chronic problem with Kathlene November as his wife and son have been unwilling to step up and assure that he gets his insulin. We've been encouraging placement long-term for at least a couple weeks nothing is occurred. We have had interventions with home health to little avail. We have had once to twice weekly visits with our opposite lesion to ensure that he gets medicine. Adult protective services has been contacted in the past because of this lack of medical compliance as it has been felt is been a danger to his life but no significant interventions have yielded any results. Bottom line, Kathlene November and a baseline with his memory impairment does not remember and is not willing to be compliant with his diabetic monitoring or demonstration of insulin regular basis. For unclear reasons interventions on behalf of wife and/or son have PLT of any yield any results as well. As such despite our twice weekly administration of insulin and daily phone calls he resulted in this decline in presentation.  Hospital Course: Patient was admitted encephalopathic with an acute superimposed upon chronic mental status changes. This is largely because of his hyperosmolar state and metabolic arrangements. He was treated with crystalloid, IV insulin and gradually stabilized with clearing of his hyperosmolar state and stabilization of his blood sugars. Renal function has remained stable throughout. Neurologically he is pleasantly confused and cooperative clearly no self and hospital but not circumstances and this is his baseline. His neurologic exam is been on lateralizing. He's had no seizure activity. He's been hemodynamically stable. Ultimately fluids were discontinued and he was titrated back to a baseline regimen of multiple daily injections.  Day of Discharge Exam BP 150/75  Pulse 73  Temp 98.5 F (36.9 C) (Oral)  Resp 16  Ht  5\' 8"  (1.727 m)  Wt 74.5 kg (164 lb 3.9 oz)  BMI 24.97 kg/m2  SpO2 100%  Physical Exam: Patient is awake alert pleasantly confused on the day of discharge. He seems to know me but obviously doesn't recall any circumstances. He is cooperating with the staff. Good facial symmetry. No JVD or bruits. Lungs are clear. Cardiovascular exam is regular no murmur gallop rub or heave. Abdomen is soft and nontender. No peripheral edema. Intact distal pulses. Motor is 5 out of 5. Discharge Labs:  [facility] 12/09/11 0418 12/08/11 0730  NA 135 136  K 3.5 3.8  CL 106 105  CO2 20 22  GLUCOSE 161* 138*  BUN 17 21  CREATININE 1.05 0.94  CALCIUM 8.2* 8.1*  MG -- --  PHOS -- --    Basename 12/07/11 1708  AST 23  ALT 10  ALKPHOS 107  BILITOT 0.3  PROT 7.5  ALBUMIN 3.6  Basename 12/08/11 0200 12/07/11 1708  WBC 7.0 8.7  NEUTROABS -- --  HGB 10.7* 13.1  HCT 30.6* 37.8*  MCV 86.9 89.4  PLT 340 436*   No results found for this basename: CKTOTAL:3,CKMB:3,CKMBINDEX:3,TROPONINI:3 in the last 72 hours No results found for this basename: TSH,T4TOTAL,FREET3,T3FREE,THYROIDAB in the last 72 hours No results found for this basename: VITAMINB12:2,FOLATE:2,FERRITIN:2,TIBC:2,IRON:2,RETICCTPCT:2 in the last 72 hours  Discharge instructions:     Discharge Orders    Future Orders Please Complete By Expires   Diet - low sodium heart healthy      Increase activity slowly         Disposition: Skilled nursing for OT and PT because of debilitation  Follow-up Appts: Follow-up with Dr. Jacky Kindle at [facility] in less than one week it skilled nursing and once discharged from skilled nursing weekly in the office.  Call for appointment.  Condition on Discharge: Improved and stable condition  Tests Needing Follow-up: None  Signed: Denys Salinger A 12/09/2011, 11:20 AM

## 2011-12-09 NOTE — Progress Notes (Signed)
Patient is set to discharge to [facility] tomorrow, Sat. 11/9. Son to complete admission paperwork at facility this afternoon. Dr. Jacky Kindle aware & will complete D/C Summary & med list today. Son requesting that patient transfer to facility via non-emergency ambulance. Weekend CSW, Elonda Husky (ph#: [phone]) to facilitate discharge tomorrow.   Clinical Social Work Department CLINICAL SOCIAL WORK PLACEMENT NOTE 12/09/2011  Patient:  [name] Walker, [name] Walker  Account Number:  [id number] Admit date:  12/07/2011  Clinical Social Worker:  Orpah Greek  Date/time:  12/09/2011 10:55 AM  Clinical Social Work is seeking post-discharge placement for this patient at the following level of care:   SKILLED NURSING   (*CSW will update this form in Epic as items are completed)   12/09/2011  Patient/family provided with [facility] Department of Clinical Social Work's list of facilities offering this level of care within the geographic area requested by the patient (or if unable, by the patient's family).  12/09/2011  Patient/family informed of their freedom to choose among providers that offer the needed level of care, that participate in Medicare, Medicaid or managed care program needed by the patient, have an available bed and are willing to accept the patient.  12/09/2011  Patient/family informed of [facility]' ownership interest in [facility], as well as of the fact that they are under no obligation to receive care at this facility.  PASARR submitted to E[employer] on 12/09/2011 PASARR number received from [employer] on 12/09/2011  FL2 transmitted to all facilities in geographic area requested by pt/family on  12/09/2011 FL2 transmitted to all facilities within larger geographic area on   Patient informed that his/her managed care company has contracts with or will negotiate with  certain facilities, including the following:     Patient/family informed of bed offers received:  12/09/2011 Patient  chooses bed at [facility] Physician recommends and patient chooses bed at    Patient to be transferred to [facility] on  12/10/2011 Patient to be transferred to facility by [facility]  The following physician request were entered in Epic:   Additional Comments:  Unice Bailey, LCSW [facility] Clinical Social Worker cell #: [phone]

## 2011-12-10 NOTE — Clinical Social Work Note (Signed)
CSW spoke with Inetta Fermo at [facility] who stated that pt can be transported to facility at this time. All required information has been faxed to facility. CSW will alert RN in order to coordinate [facility] transport. CSW will also contact pt's son Ree Kida [phone] to notify him of plan of transport to [facility]. CSW will remain available in the event that additional assistance is needed.   Janann Colonel., MSW, [facility] Clinical Social Worker [phone]

## 2011-12-10 NOTE — Progress Notes (Signed)
Subjective: Feels well. No pain. Eating Ok   Objective: Vital signs in last 24 hours: Temp:  [97.9 F (36.6 C)-99.9 F (37.7 C)] 97.9 F (36.6 C) (11/09 0512) Pulse Rate:  [73-90] 73  (11/09 0512) Resp:  [18] 18  (11/09 0512) BP: (136-156)/(44-87) 144/87 mmHg (11/09 0512) SpO2:  [99 %] 99 % (11/09 0512)  Intake/Output from previous day: 11/08 0701 - 11/09 0700 In: 480 [P.O.:480] Out: 2775 [Urine:2775] Intake/Output this shift: Total I/O In: 320 [P.O.:320] Out: 500 [Urine:500]  General: no distress lying flat in no distress. Face symmetric. Neck supple. Lungs clear. Ht regular with murmur abd soft NT, extrems, thin no edema. Awake, confused  Lab Results   Basename 12/08/11 0200 12/07/11 1708  WBC 7.0 8.7  RBC 3.52* 4.23  HGB 10.7* 13.1  HCT 30.6* 37.8*  MCV 86.9 89.4  MCH 30.4 31.0  RDW 13.5 13.5  PLT 340 436*    Basename 12/09/11 0418 12/08/11 0730  NA 135 136  K 3.5 3.8  CL 106 105  CO2 20 22  GLUCOSE 161* 138*  BUN 17 21  CREATININE 1.05 0.94  CALCIUM 8.2* 8.1*    Studies/Results: No results found.  Scheduled Meds:   . cholecalciferol  1,000 Units Oral Daily  . clopidogrel  75 mg Oral Daily  . insulin aspart  0-15 Units Subcutaneous TID WC  . insulin aspart protamine-insulin aspart  20 Units Subcutaneous BID WC  . levETIRAcetam  500 mg Oral Daily  . lisinopril  5 mg Oral Daily  . metoprolol succinate  50 mg Oral Daily   Continuous Infusions:  PRN Meds:acetaminophen, alum & mag hydroxide-simeth, dextrose, ondansetron (ZOFRAN) IV, ondansetron, zolpidem  Assessment/Plan: Patient Active Problem List  Diagnosis  .  Diabetic ketoacidosis : doing well. 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   .   .   .   .   .   .   .   .   .   .   .   .    .            LOS: 3 days   Tamikia Chowning ALAN 12/10/2011, 9:37 AM

## 2011-12-10 NOTE — Progress Notes (Signed)
Report called to [facility] and report given to Star.

## 2012-12-30 ENCOUNTER — Emergency Department (HOSPITAL_COMMUNITY): Payer: Medicare Other

## 2012-12-30 ENCOUNTER — Emergency Department (HOSPITAL_COMMUNITY)
Admission: EM | Admit: 2012-12-30 | Discharge: 2012-12-31 | Disposition: A | Discharge status: Skilled Nursing Facility | Payer: Medicare Other | Attending: Emergency Medicine | Admitting: Emergency Medicine

## 2012-12-30 ENCOUNTER — Encounter (HOSPITAL_COMMUNITY): Payer: Self-pay | Admitting: Emergency Medicine

## 2012-12-30 DIAGNOSIS — Z87828 Personal history of other (healed) physical injury and trauma: Secondary | ICD-10-CM | POA: Insufficient documentation

## 2012-12-30 DIAGNOSIS — Z87891 Personal history of nicotine dependence: Secondary | ICD-10-CM | POA: Insufficient documentation

## 2012-12-30 DIAGNOSIS — Z7902 Long term (current) use of antithrombotics/antiplatelets: Secondary | ICD-10-CM | POA: Insufficient documentation

## 2012-12-30 DIAGNOSIS — S161XXA Strain of muscle, fascia and tendon at neck level, initial encounter: Secondary | ICD-10-CM

## 2012-12-30 DIAGNOSIS — F0390 Unspecified dementia without behavioral disturbance: Secondary | ICD-10-CM | POA: Insufficient documentation

## 2012-12-30 DIAGNOSIS — D649 Anemia, unspecified: Secondary | ICD-10-CM | POA: Insufficient documentation

## 2012-12-30 DIAGNOSIS — G40909 Epilepsy, unspecified, not intractable, without status epilepticus: Secondary | ICD-10-CM | POA: Insufficient documentation

## 2012-12-30 DIAGNOSIS — M129 Arthropathy, unspecified: Secondary | ICD-10-CM | POA: Insufficient documentation

## 2012-12-30 DIAGNOSIS — Z79899 Other long term (current) drug therapy: Secondary | ICD-10-CM | POA: Insufficient documentation

## 2012-12-30 DIAGNOSIS — I2510 Atherosclerotic heart disease of native coronary artery without angina pectoris: Secondary | ICD-10-CM | POA: Insufficient documentation

## 2012-12-30 DIAGNOSIS — Z794 Long term (current) use of insulin: Secondary | ICD-10-CM | POA: Insufficient documentation

## 2012-12-30 DIAGNOSIS — E119 Type 2 diabetes mellitus without complications: Secondary | ICD-10-CM | POA: Insufficient documentation

## 2012-12-30 DIAGNOSIS — I10 Essential (primary) hypertension: Secondary | ICD-10-CM | POA: Insufficient documentation

## 2012-12-30 DIAGNOSIS — Z95818 Presence of other cardiac implants and grafts: Secondary | ICD-10-CM | POA: Insufficient documentation

## 2012-12-30 DIAGNOSIS — S139XXA Sprain of joints and ligaments of unspecified parts of neck, initial encounter: Secondary | ICD-10-CM | POA: Insufficient documentation

## 2012-12-30 DIAGNOSIS — S0990XA Unspecified injury of head, initial encounter: Secondary | ICD-10-CM

## 2012-12-30 MED ORDER — ACETAMINOPHEN 500 MG PO TABS
1000.0000 mg | ORAL_TABLET | Freq: Once | ORAL | Status: AC
  Administered 2012-12-30: 1000 mg via ORAL
  Filled 2012-12-30: qty 2
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
## 2012-12-30 NOTE — ED Notes (Signed)
Left pupil is tear drop shaped, pt reports this is from retinal surgery years ago.
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
## 2012-12-31 NOTE — Discharge Instructions (Signed)
 Cervical Sprain A cervical sprain is an injury in the neck in which the ligaments are stretched or torn. The ligaments are the tissues that hold the bones of the neck (vertebrae) in place.Cervical sprains can range from very mild to very severe. Most cervical sprains get better in 1 to 3 weeks, but it depends on the cause and extent of the injury. Severe cervical sprains can cause the neck vertebrae to be unstable. This can lead to damage of the spinal cord and can result in serious nervous system problems. Your caregiver will determine whether your cervical sprain is mild or severe. CAUSES  Severe cervical sprains may be caused by:  Contact sport injuries (football, rugby, wrestling, hockey, auto racing, gymnastics, diving, martial arts, boxing).  Motor vehicle collisions.  Whiplash injuries. This means the neck is forcefully whipped backward and forward.  Falls. Mild cervical sprains may be caused by:   Awkward positions, such as cradling a telephone between your ear and shoulder.  Sitting in a chair that does not offer proper support.  Working at a poorly Marketing executive station.  Activities that require looking up or down for long periods of time. SYMPTOMS   Pain, soreness, stiffness, or a burning sensation in the front, back, or sides of the neck. This discomfort may develop immediately after injury or it may develop slowly and not begin for 24 hours or more after an injury.  Pain or tenderness directly in the middle of the back of the neck.  Shoulder or upper back pain.  Limited ability to move the neck.  Headache.  Dizziness.  Weakness, numbness, or tingling in the hands or arms.  Muscle spasms.  Difficulty swallowing or chewing.  Tenderness and swelling of the neck. DIAGNOSIS  Most of the time, your caregiver can diagnose this problem by taking your history and doing a physical exam. Your caregiver will ask about any known problems, such as arthritis in the neck  or a previous neck injury. X-rays may be taken to find out if there are any other problems, such as problems with the bones of the neck. However, an X-ray often does not reveal the full extent of a cervical sprain. Other tests such as a computed tomography (CT) scan or magnetic resonance imaging (MRI) may be needed. TREATMENT  Treatment depends on the severity of the cervical sprain. Mild sprains can be treated with rest, keeping the neck in place (immobilization), and pain medicines. Severe cervical sprains need immediate immobilization and an appointment with an orthopedist or neurosurgeon. Several treatment options are available to help with pain, muscle spasms, and other symptoms. Your caregiver may prescribe:  Medicines, such as pain relievers, numbing medicines, or muscle relaxants.  Physical therapy. This can include stretching exercises, strengthening exercises, and posture training. Exercises and improved posture can help stabilize the neck, strengthen muscles, and help stop symptoms from returning.  A neck collar to be worn for short periods of time. Often, these collars are worn for comfort. However, certain collars may be worn to protect the neck and prevent further worsening of a serious cervical sprain. HOME CARE INSTRUCTIONS   Put ice on the injured area.  Put ice in a plastic bag.  Place a towel between your skin and the bag.  Leave the ice on for 15-20 minutes, 03-04 times a day.  Only take over-the-counter or prescription medicines for pain, discomfort, or fever as directed by your caregiver.  Keep all follow-up appointments as directed by your caregiver.  Keep all  physical therapy appointments as directed by your caregiver.  If a neck collar is prescribed, wear it as directed by your caregiver.  Do not drive while wearing a neck collar.  Make any needed adjustments to your work station to promote good posture.  Avoid positions and activities that make your symptoms  worse.  Warm up and stretch before being active to help prevent problems. SEEK MEDICAL CARE IF:   Your pain is not controlled with medicine.  You are unable to decrease your pain medicine over time as planned.  Your activity level is not improving as expected. SEEK IMMEDIATE MEDICAL CARE IF:   You develop any bleeding, stomach upset, or signs of an allergic reaction to your medicine.  Your symptoms get worse.  You develop new, unexplained symptoms.  You have numbness, tingling, weakness, or paralysis in any part of your body. MAKE SURE YOU:   Understand these instructions.  Will watch your condition.  Will get help right away if you are not doing well or get worse. Document Released: 11/14/2006 Document Revised: 04/11/2011 Document Reviewed: 07/25/2012 [facility] Patient Information 2014 [place], [place].     Head Injury, Adult You have had a head injury that does not appear serious at this time. A concussion is a state of changed mental ability, usually from a blow to the head. You should take clear liquids for the rest of the day and then resume your regular diet. You should not take sedatives or alcoholic beverages for as long as directed by your caregiver after discharge. After injuries such as yours, most problems occur within the first 24 hours. SYMPTOMS These minor symptoms may be experienced after discharge:  Memory difficulties.  Dizziness.  Headaches.  Double vision.  Hearing difficulties.  Depression.  Tiredness.  Weakness.  Difficulty with concentration. If you experience any of these problems, you should not be alarmed. A concussion requires a few days for recovery. Many patients with head injuries frequently experience such symptoms. Usually, these problems disappear without medical care. If symptoms last for more than one day, notify your caregiver. See your caregiver sooner if symptoms are becoming worse rather than better. HOME CARE INSTRUCTIONS     During the next 24 hours you must stay with someone who can watch you for the warning signs listed below. Although it is unlikely that serious side effects will occur, you should be aware of signs and symptoms which may necessitate your return to this location. Side effects may occur up to 7  10 days following the injury. It is important for you to carefully monitor your condition and contact your caregiver or seek immediate medical attention if there is a change in your condition. SEEK IMMEDIATE MEDICAL CARE IF:   There is confusion or drowsiness.  You can not awaken the injured person.  There is nausea (feeling sick to your stomach) or continued, forceful vomiting.  You notice dizziness or unsteadiness which is getting worse, or inability to walk.  You have convulsions or unconsciousness.  You experience severe, persistent headaches not relieved by over-the-counter or prescription medicines for pain. (Do not take aspirin  as this impairs clotting abilities). Take other pain medications only as directed.  You can not use arms or legs normally.  There is clear or bloody discharge from the nose or ears. MAKE SURE YOU:   Understand these instructions.  Will watch your condition.  Will get help right away if you are not doing well or get worse. Document Released: 01/17/2005 Document Revised: 04/11/2011  Document Reviewed: 12/05/2008 [facility] Patient Information 2014 [place], [place].     Your CT scans show no significant internal injuries.  Continue to take tylenol  and use ice packs for swelling.  Follow up with Dr. Shepard next week for any unusual or continued symptoms.

## 2013-02-23 ENCOUNTER — Emergency Department (HOSPITAL_COMMUNITY): Payer: Medicare Other

## 2013-02-23 ENCOUNTER — Inpatient Hospital Stay (HOSPITAL_COMMUNITY)
Admission: EM | Admit: 2013-02-23 | Discharge: 2013-02-26 | DRG: 637 | Disposition: A | Discharge status: Skilled Nursing Facility | Payer: Medicare Other | Attending: Family Medicine | Admitting: Family Medicine

## 2013-02-23 ENCOUNTER — Encounter (HOSPITAL_COMMUNITY): Payer: Self-pay | Admitting: Emergency Medicine

## 2013-02-23 DIAGNOSIS — G9340 Encephalopathy, unspecified: Secondary | ICD-10-CM | POA: Diagnosis present

## 2013-02-23 DIAGNOSIS — E1310 Other specified diabetes mellitus with ketoacidosis without coma: Principal | ICD-10-CM | POA: Diagnosis present

## 2013-02-23 DIAGNOSIS — I10 Essential (primary) hypertension: Secondary | ICD-10-CM | POA: Diagnosis present

## 2013-02-23 DIAGNOSIS — G40909 Epilepsy, unspecified, not intractable, without status epilepticus: Secondary | ICD-10-CM | POA: Diagnosis present

## 2013-02-23 DIAGNOSIS — K222 Esophageal obstruction: Secondary | ICD-10-CM

## 2013-02-23 DIAGNOSIS — J449 Chronic obstructive pulmonary disease, unspecified: Secondary | ICD-10-CM | POA: Diagnosis present

## 2013-02-23 DIAGNOSIS — Z87891 Personal history of nicotine dependence: Secondary | ICD-10-CM

## 2013-02-23 DIAGNOSIS — E1010 Type 1 diabetes mellitus with ketoacidosis without coma: Secondary | ICD-10-CM | POA: Insufficient documentation

## 2013-02-23 DIAGNOSIS — R32 Unspecified urinary incontinence: Secondary | ICD-10-CM

## 2013-02-23 DIAGNOSIS — R6510 Systemic inflammatory response syndrome (SIRS) of non-infectious origin without acute organ dysfunction: Secondary | ICD-10-CM | POA: Diagnosis present

## 2013-02-23 DIAGNOSIS — H5440 Blindness, one eye, unspecified eye: Secondary | ICD-10-CM | POA: Diagnosis present

## 2013-02-23 DIAGNOSIS — I639 Cerebral infarction, unspecified: Secondary | ICD-10-CM

## 2013-02-23 DIAGNOSIS — E1110 Type 2 diabetes mellitus with ketoacidosis without coma: Secondary | ICD-10-CM | POA: Diagnosis present

## 2013-02-23 DIAGNOSIS — I2510 Atherosclerotic heart disease of native coronary artery without angina pectoris: Secondary | ICD-10-CM | POA: Diagnosis present

## 2013-02-23 DIAGNOSIS — M129 Arthropathy, unspecified: Secondary | ICD-10-CM | POA: Diagnosis present

## 2013-02-23 DIAGNOSIS — K5730 Diverticulosis of large intestine without perforation or abscess without bleeding: Secondary | ICD-10-CM

## 2013-02-23 DIAGNOSIS — J45909 Unspecified asthma, uncomplicated: Secondary | ICD-10-CM

## 2013-02-23 DIAGNOSIS — A419 Sepsis, unspecified organism: Secondary | ICD-10-CM | POA: Diagnosis present

## 2013-02-23 DIAGNOSIS — J301 Allergic rhinitis due to pollen: Secondary | ICD-10-CM

## 2013-02-23 DIAGNOSIS — E875 Hyperkalemia: Secondary | ICD-10-CM | POA: Diagnosis present

## 2013-02-23 DIAGNOSIS — IMO0002 Reserved for concepts with insufficient information to code with codable children: Secondary | ICD-10-CM

## 2013-02-23 DIAGNOSIS — K449 Diaphragmatic hernia without obstruction or gangrene: Secondary | ICD-10-CM

## 2013-02-23 DIAGNOSIS — R413 Other amnesia: Secondary | ICD-10-CM

## 2013-02-23 DIAGNOSIS — J4489 Other specified chronic obstructive pulmonary disease: Secondary | ICD-10-CM | POA: Diagnosis present

## 2013-02-23 DIAGNOSIS — Z79899 Other long term (current) drug therapy: Secondary | ICD-10-CM

## 2013-02-23 DIAGNOSIS — D649 Anemia, unspecified: Secondary | ICD-10-CM

## 2013-02-23 DIAGNOSIS — F0390 Unspecified dementia without behavioral disturbance: Secondary | ICD-10-CM | POA: Diagnosis present

## 2013-02-23 DIAGNOSIS — N289 Disorder of kidney and ureter, unspecified: Secondary | ICD-10-CM

## 2013-02-23 DIAGNOSIS — Z66 Do not resuscitate: Secondary | ICD-10-CM | POA: Diagnosis present

## 2013-02-23 DIAGNOSIS — J189 Pneumonia, unspecified organism: Secondary | ICD-10-CM | POA: Diagnosis present

## 2013-02-23 DIAGNOSIS — E785 Hyperlipidemia, unspecified: Secondary | ICD-10-CM | POA: Diagnosis present

## 2013-02-23 DIAGNOSIS — E119 Type 2 diabetes mellitus without complications: Secondary | ICD-10-CM | POA: Diagnosis present

## 2013-02-23 DIAGNOSIS — N179 Acute kidney failure, unspecified: Secondary | ICD-10-CM | POA: Diagnosis present

## 2013-02-23 DIAGNOSIS — Z794 Long term (current) use of insulin: Secondary | ICD-10-CM

## 2013-02-23 DIAGNOSIS — Z7902 Long term (current) use of antithrombotics/antiplatelets: Secondary | ICD-10-CM

## 2013-02-23 DIAGNOSIS — E860 Dehydration: Secondary | ICD-10-CM

## 2013-02-23 DIAGNOSIS — E1100 Type 2 diabetes mellitus with hyperosmolarity without nonketotic hyperglycemic-hyperosmolar coma (NKHHC): Secondary | ICD-10-CM

## 2013-02-23 DIAGNOSIS — E43 Unspecified severe protein-calorie malnutrition: Secondary | ICD-10-CM | POA: Insufficient documentation

## 2013-02-23 LAB — COMPREHENSIVE METABOLIC PANEL
ALBUMIN: 3.2 g/dL — AB (ref 3.5–5.2)
ALT: 13 U/L (ref 0–53)
AST: 13 U/L (ref 0–37)
Alkaline Phosphatase: 69 U/L (ref 39–117)
BILIRUBIN TOTAL: 0.6 mg/dL (ref 0.3–1.2)
BUN: 34 mg/dL — AB (ref 6–23)
CHLORIDE: 103 meq/L (ref 96–112)
CO2: 15 mEq/L — ABNORMAL LOW (ref 19–32)
Calcium: 8.4 mg/dL (ref 8.4–10.5)
Creatinine, Ser: 1.91 mg/dL — ABNORMAL HIGH (ref 0.50–1.35)
GFR calc Af Amer: 37 mL/min — ABNORMAL LOW (ref >90)
GFR calc non Af Amer: 32 mL/min — ABNORMAL LOW (ref >90)
GLUCOSE: 347 mg/dL — AB (ref 70–99)
POTASSIUM: 5.7 meq/L — AB (ref 3.7–5.3)
Sodium: 139 mEq/L (ref 137–147)
TOTAL PROTEIN: 6.8 g/dL (ref 6.0–8.3)

## 2013-02-23 LAB — CBC WITH DIFFERENTIAL/PLATELET
BASOS ABS: 0 10*3/uL (ref 0.0–0.1)
BASOS PCT: 0 % (ref 0–1)
EOS ABS: 0 10*3/uL (ref 0.0–0.7)
Eosinophils Relative: 0 % (ref 0–5)
HEMATOCRIT: 37.4 % — AB (ref 39.0–52.0)
HEMOGLOBIN: 12.8 g/dL — AB (ref 13.0–17.0)
Lymphocytes Relative: 4 % — ABNORMAL LOW (ref 12–46)
Lymphs Abs: 0.5 10*3/uL — ABNORMAL LOW (ref 0.7–4.0)
MCH: 29.3 pg (ref 26.0–34.0)
MCHC: 34.2 g/dL (ref 30.0–36.0)
MCV: 85.6 fL (ref 78.0–100.0)
MONOS PCT: 5 % (ref 3–12)
Monocytes Absolute: 0.6 10*3/uL (ref 0.1–1.0)
NEUTROS ABS: 11 10*3/uL — AB (ref 1.7–7.7)
NEUTROS PCT: 91 % — AB (ref 43–77)
Platelets: 287 10*3/uL (ref 150–400)
RBC: 4.37 MIL/uL (ref 4.22–5.81)
RDW: 13 % (ref 11.5–15.5)
WBC Morphology: INCREASED
WBC: 12.1 10*3/uL — AB (ref 4.0–10.5)

## 2013-02-23 LAB — URINALYSIS, ROUTINE W REFLEX MICROSCOPIC
GLUCOSE, UA: NEGATIVE mg/dL
Hgb urine dipstick: NEGATIVE
KETONES UR: 15 mg/dL — AB
Nitrite: NEGATIVE
PH: 5 (ref 5.0–8.0)
PROTEIN: NEGATIVE mg/dL
Specific Gravity, Urine: 1.02 (ref 1.005–1.030)
Urobilinogen, UA: 0.2 mg/dL (ref 0.0–1.0)

## 2013-02-23 LAB — BASIC METABOLIC PANEL
BUN: 36 mg/dL — AB (ref 6–23)
BUN: 33 mg/dL — ABNORMAL HIGH (ref 6–23)
CALCIUM: 7.8 mg/dL — AB (ref 8.4–10.5)
CHLORIDE: 103 meq/L (ref 96–112)
CO2: 14 mEq/L — ABNORMAL LOW (ref 19–32)
CO2: 15 mEq/L — ABNORMAL LOW (ref 19–32)
CREATININE: 2 mg/dL — AB (ref 0.50–1.35)
Calcium: 7.8 mg/dL — ABNORMAL LOW (ref 8.4–10.5)
Chloride: 108 mEq/L (ref 96–112)
Creatinine, Ser: 1.82 mg/dL — ABNORMAL HIGH (ref 0.50–1.35)
GFR calc Af Amer: 35 mL/min — ABNORMAL LOW (ref >90)
GFR calc Af Amer: 39 mL/min — ABNORMAL LOW (ref >90)
GFR calc non Af Amer: 30 mL/min — ABNORMAL LOW (ref >90)
GFR calc non Af Amer: 34 mL/min — ABNORMAL LOW (ref >90)
GLUCOSE: 182 mg/dL — AB (ref 70–99)
Glucose, Bld: 418 mg/dL — ABNORMAL HIGH (ref 70–99)
Potassium: 6.1 mEq/L — ABNORMAL HIGH (ref 3.7–5.3)
Potassium: 4.9 mEq/L (ref 3.7–5.3)
Sodium: 135 mEq/L — ABNORMAL LOW (ref 137–147)
Sodium: 140 mEq/L (ref 137–147)

## 2013-02-23 LAB — MRSA PCR SCREENING: MRSA by PCR: POSITIVE — AB

## 2013-02-23 LAB — GLUCOSE, CAPILLARY
GLUCOSE-CAPILLARY: 342 mg/dL — AB (ref 70–99)
Glucose-Capillary: 306 mg/dL — ABNORMAL HIGH (ref 70–99)
Glucose-Capillary: 352 mg/dL — ABNORMAL HIGH (ref 70–99)
Glucose-Capillary: 388 mg/dL — ABNORMAL HIGH (ref 70–99)
Glucose-Capillary: 243 mg/dL — ABNORMAL HIGH (ref 70–99)

## 2013-02-23 LAB — URINE MICROSCOPIC-ADD ON

## 2013-02-23 LAB — CG4 I-STAT (LACTIC ACID): Lactic Acid, Venous: 1.92 mmol/L (ref 0.5–2.2)

## 2013-02-23 MED ORDER — SODIUM CHLORIDE 0.9 % IJ SOLN
3.0000 mL | Freq: Two times a day (BID) | INTRAMUSCULAR | Status: DC
  Administered 2013-02-24 – 2013-02-26 (×4): 3 mL via INTRAVENOUS

## 2013-02-23 MED ORDER — INSULIN REGULAR HUMAN 100 UNIT/ML IJ SOLN
INTRAMUSCULAR | Status: DC
  Filled 2013-02-23: qty 1

## 2013-02-23 MED ORDER — DEXTROSE 50 % IV SOLN: 25.0000 mL | INTRAVENOUS | Status: DC | PRN

## 2013-02-23 MED ORDER — DONEPEZIL HCL 10 MG PO TABS
10.0000 mg | ORAL_TABLET | Freq: Every day | ORAL | Status: DC
  Administered 2013-02-24 – 2013-02-25 (×2): 10 mg via ORAL
  Filled 2013-02-23 (×4): qty 1

## 2013-02-23 MED ORDER — INSULIN REGULAR BOLUS VIA INFUSION
0.0000 [IU] | Freq: Three times a day (TID) | INTRAVENOUS | Status: DC
  Filled 2013-02-23: qty 10

## 2013-02-23 MED ORDER — DEXTROSE 5 % IV SOLN
1.0000 g | INTRAVENOUS | Status: DC
  Administered 2013-02-23 – 2013-02-24 (×2): 1 g via INTRAVENOUS
  Filled 2013-02-23 (×3): qty 1

## 2013-02-23 MED ORDER — PIPERACILLIN SOD-TAZOBACTAM SO 2.25 (2-0.25) G IV SOLR
3.3750 g | INTRAVENOUS | Status: AC
  Administered 2013-02-23: 3.375 g via INTRAVENOUS
  Filled 2013-02-23: qty 3.38

## 2013-02-23 MED ORDER — ESCITALOPRAM OXALATE 20 MG PO TABS
20.0000 mg | ORAL_TABLET | Freq: Every morning | ORAL | Status: DC
  Administered 2013-02-24 – 2013-02-26 (×3): 20 mg via ORAL
  Filled 2013-02-23 (×3): qty 1

## 2013-02-23 MED ORDER — LORAZEPAM 0.5 MG PO TABS: 0.5000 mg | ORAL_TABLET | Freq: Two times a day (BID) | ORAL | Status: DC | PRN

## 2013-02-23 MED ORDER — ACETAMINOPHEN 650 MG RE SUPP: 650.0000 mg | Freq: Four times a day (QID) | RECTAL | Status: DC | PRN

## 2013-02-23 MED ORDER — METOPROLOL SUCCINATE ER 50 MG PO TB24
50.0000 mg | ORAL_TABLET | Freq: Every morning | ORAL | Status: DC
  Administered 2013-02-24 – 2013-02-26 (×3): 50 mg via ORAL
  Filled 2013-02-23 (×3): qty 1

## 2013-02-23 MED ORDER — VANCOMYCIN HCL 10 G IV SOLR
1250.0000 mg | INTRAVENOUS | Status: AC
  Administered 2013-02-23: 1250 mg via INTRAVENOUS
  Filled 2013-02-23: qty 1250

## 2013-02-23 MED ORDER — LEVETIRACETAM 500 MG PO TABS
500.0000 mg | ORAL_TABLET | Freq: Every morning | ORAL | Status: DC
  Administered 2013-02-24 – 2013-02-26 (×3): 500 mg via ORAL
  Filled 2013-02-23 (×3): qty 1

## 2013-02-23 MED ORDER — DIVALPROEX SODIUM 250 MG PO DR TAB
375.0000 mg | DELAYED_RELEASE_TABLET | Freq: Every day | ORAL | Status: DC
  Filled 2013-02-23 (×2): qty 1

## 2013-02-23 MED ORDER — OSELTAMIVIR PHOSPHATE 75 MG PO CAPS
75.0000 mg | ORAL_CAPSULE | Freq: Every day | ORAL | Status: DC
  Filled 2013-02-23 (×2): qty 1

## 2013-02-23 MED ORDER — FAMOTIDINE 20 MG PO TABS
20.0000 mg | ORAL_TABLET | Freq: Two times a day (BID) | ORAL | Status: DC
  Administered 2013-02-24 – 2013-02-26 (×5): 20 mg via ORAL
  Filled 2013-02-23 (×7): qty 1

## 2013-02-23 MED ORDER — ONDANSETRON HCL 4 MG PO TABS: 4.0000 mg | ORAL_TABLET | Freq: Four times a day (QID) | ORAL | Status: DC | PRN

## 2013-02-23 MED ORDER — ADULT MULTIVITAMIN W/MINERALS CH
1.0000 | ORAL_TABLET | Freq: Every morning | ORAL | Status: DC
  Administered 2013-02-24 – 2013-02-26 (×3): 1 via ORAL
  Filled 2013-02-23 (×3): qty 1

## 2013-02-23 MED ORDER — ONDANSETRON HCL 4 MG/2ML IJ SOLN: 4.0000 mg | Freq: Four times a day (QID) | INTRAMUSCULAR | Status: DC | PRN

## 2013-02-23 MED ORDER — INSULIN REGULAR BOLUS VIA INFUSION
0.0000 [IU] | Freq: Three times a day (TID) | INTRAVENOUS | Status: DC
Start: 2013-02-24 — End: 2013-02-23
  Filled 2013-02-23: qty 10

## 2013-02-23 MED ORDER — PANTOPRAZOLE SODIUM 20 MG PO TBEC
20.0000 mg | DELAYED_RELEASE_TABLET | Freq: Every day | ORAL | Status: DC
  Administered 2013-02-24 – 2013-02-26 (×3): 20 mg via ORAL
  Filled 2013-02-23 (×3): qty 1

## 2013-02-23 MED ORDER — SODIUM CHLORIDE 0.9 % IV SOLN
INTRAVENOUS | Status: DC
  Administered 2013-02-23: 19:00:00 via INTRAVENOUS

## 2013-02-23 MED ORDER — VITAMIN D3 25 MCG (1000 UNIT) PO TABS
1000.0000 [IU] | ORAL_TABLET | Freq: Every morning | ORAL | Status: DC
  Administered 2013-02-24 – 2013-02-26 (×3): 1000 [IU] via ORAL
  Filled 2013-02-23 (×3): qty 1

## 2013-02-23 MED ORDER — SODIUM CHLORIDE 0.9 % IV SOLN
INTRAVENOUS | Status: AC
  Administered 2013-02-23: 16:00:00 via INTRAVENOUS

## 2013-02-23 MED ORDER — MONTELUKAST SODIUM 10 MG PO TABS
10.0000 mg | ORAL_TABLET | Freq: Every morning | ORAL | Status: DC
  Administered 2013-02-24 – 2013-02-26 (×3): 10 mg via ORAL
  Filled 2013-02-23 (×3): qty 1

## 2013-02-23 MED ORDER — INSULIN ASPART 100 UNIT/ML ~~LOC~~ SOLN: 10.0000 [IU] | Freq: Once | SUBCUTANEOUS | Status: DC

## 2013-02-23 MED ORDER — DEXTROSE-NACL 5-0.45 % IV SOLN
INTRAVENOUS | Status: DC
  Administered 2013-02-23: 125 mL via INTRAVENOUS
  Administered 2013-02-24: 06:00:00 via INTRAVENOUS

## 2013-02-23 MED ORDER — SODIUM CHLORIDE 0.9 % IV BOLUS (SEPSIS)
1000.0000 mL | Freq: Once | INTRAVENOUS | Status: DC
  Administered 2013-02-23: 1000 mL via INTRAVENOUS

## 2013-02-23 MED ORDER — HYDROXYZINE HCL 25 MG PO TABS
25.0000 mg | ORAL_TABLET | Freq: Three times a day (TID) | ORAL | Status: DC
  Administered 2013-02-24 – 2013-02-26 (×7): 25 mg via ORAL
  Filled 2013-02-23 (×10): qty 1

## 2013-02-23 MED ORDER — VANCOMYCIN HCL IN DEXTROSE 1-5 GM/200ML-% IV SOLN
1000.0000 mg | INTRAVENOUS | Status: DC
  Administered 2013-02-24: 1000 mg via INTRAVENOUS
  Filled 2013-02-23 (×2): qty 200

## 2013-02-23 MED ORDER — ACETAMINOPHEN 650 MG RE SUPP
650.0000 mg | Freq: Once | RECTAL | Status: AC
  Administered 2013-02-23: 650 mg via RECTAL
  Filled 2013-02-23: qty 1

## 2013-02-23 MED ORDER — OSELTAMIVIR PHOSPHATE 75 MG PO CAPS
75.0000 mg | ORAL_CAPSULE | Freq: Every day | ORAL | Status: DC
  Filled 2013-02-23: qty 1

## 2013-02-23 MED ORDER — CLOPIDOGREL BISULFATE 75 MG PO TABS
75.0000 mg | ORAL_TABLET | Freq: Every day | ORAL | Status: DC
Start: 2013-02-24 — End: 2013-02-26
  Administered 2013-02-24 – 2013-02-26 (×3): 75 mg via ORAL
  Filled 2013-02-23 (×4): qty 1

## 2013-02-23 MED ORDER — ENOXAPARIN SODIUM 30 MG/0.3ML ~~LOC~~ SOLN
30.0000 mg | SUBCUTANEOUS | Status: DC
  Administered 2013-02-23: 30 mg via SUBCUTANEOUS
  Filled 2013-02-23 (×3): qty 0.3

## 2013-02-23 MED ORDER — SODIUM CHLORIDE 0.9 % IV SOLN: INTRAVENOUS | Status: DC

## 2013-02-23 MED ORDER — ACETAMINOPHEN 325 MG PO TABS: 650.0000 mg | ORAL_TABLET | Freq: Four times a day (QID) | ORAL | Status: DC | PRN

## 2013-02-23 MED ORDER — SODIUM CHLORIDE 0.9 % IV SOLN
INTRAVENOUS | Status: DC
  Administered 2013-02-23: 2.9 [IU]/h via INTRAVENOUS
  Filled 2013-02-23: qty 1
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
## 2013-02-23 NOTE — ED Notes (Signed)
Pt's son, Rudene AndaJack [place]beck, [place], called requesting info and to be called when pt is transferred upstairs. [phone].

## 2013-02-23 NOTE — ED Notes (Signed)
Pt unable to swallow Tamiflu pill. Pt confused and does not understand to take sips of water to swallow. Will inform EDP Plunkett.

## 2013-02-23 NOTE — Progress Notes (Addendum)
ANTIBIOTIC CONSULT NOTE - INITIAL  Pharmacy Consult for Vancomycin Indication: rule out pneumonia  No Known Allergies  Patient Measurements: Height: 5\' 9"  (175.3 cm) Weight: 160 lb (72.576 kg) IBW/kg (Calculated) : 70.7  Vital Signs: Temp: 103 F (39.4 C) (01/24 1329) Temp src: Rectal (01/24 1329) BP: 157/75 mmHg (01/24 1306) Pulse Rate: 118 (01/24 1306) Intake/Output from previous day:   Intake/Output from this shift:    Labs:  Recent Labs  02/23/13 1300  WBC 12.1*  HGB 12.8*  PLT 287  CREATININE 1.91*   Estimated Creatinine Clearance: 31.4 ml/min (by C-G formula based on Cr of 1.91). No results found for this basename: VANCOTROUGH, VANCOPEAK, VANCORANDOM, GENTTROUGH, GENTPEAK, GENTRANDOM, TOBRATROUGH, TOBRAPEAK, TOBRARND, AMIKACINPEAK, AMIKACINTROU, AMIKACIN,  in the last 72 hours   Microbiology: No results found for this or any previous visit (from the past 720 hour(s)).  Medical History: Past Medical History  Diagnosis Date  . Coronary artery disease   . Diabetes mellitus   . Hypertension   . Hyperlipidemia   . Headache(784.0)   . Seizure   . Arthritis   . Anemia   . Blind left eye   . Subdural hematoma     hx recurrent right subdural hematoma    Anti-infectives: 1/24 >> Zosyn x 1 1/24 >> Vanc >>  Assessment: 80 y.o. male presenting with hyperglycemia and altered mental status. Beginning vancomycin for presumed PNA given fever, leukocytosis, bilateral consolidations on CXR.   Goal of Therapy:  Vancomycin trough level 15-20 mcg/ml  Plan:   Vancomycin 1250mg  IV x 1 now, then 1g IV q24h Check trough at steady state Follow up renal function & cultures Continue Zosyn?  Loralee PacasErin Valari Taylor, PharmD, BCPS Pager: [phone] 02/23/2013,2:35 PM   Addendum: On admission, MD ordering Cefepime per Pharmacy Start Cefepime 1g IV q24h - begin 8hr after Zosyn given in ED  Loralee PacasErin Foy Vanduyne, PharmD, BCPS 02/23/2013 6:30 PM

## 2013-02-23 NOTE — ED Provider Notes (Addendum)
CSN: [id number]     Arrival date & time 02/23/13  1223 History   First MD Initiated Contact with Patient 02/23/13 1233     Chief Complaint  Patient presents with  . Hyperglycemia  . Altered Mental Status   (Consider location/radiation/quality/duration/timing/severity/associated sxs/prior Treatment) HPI Comments: Staff initially this am at the nursing home noted bs to be 47 and given 1 stick of glucagon and hyperglycemic ever since  Patient is a 80 y.o. male presenting with hyperglycemia and altered mental status. The history is provided by the nursing home and the EMS personnel. The history is limited by the absence of a caregiver and the condition of the patient.  Hyperglycemia Blood sugar level PTA:  400 Severity:  Moderate Onset quality:  Unable to specify Timing:  Constant Diabetes status:  Controlled with insulin Associated symptoms: altered mental status, confusion and fever   Associated symptoms: no nausea, no shortness of breath and no vomiting   Associated symptoms comment:  Cough Altered Mental Status Presenting symptoms: confusion and partial responsiveness   Severity:  Moderate Most recent episode:  Yesterday Episode history:  Continuous Timing:  Constant Progression:  Worsening Chronicity:  New Associated symptoms: fever   Associated symptoms: no nausea and no vomiting     Past Medical History  Diagnosis Date  . Coronary artery disease   . Diabetes mellitus   . Hypertension   . Hyperlipidemia   . Headache(784.0)   . Seizure   . Arthritis   . Anemia   . Blind left eye   . Subdural hematoma     hx recurrent right subdural hematoma   Past Surgical History  Procedure Laterality Date  . Appendectomy      AGE 61  . Rhino-septoplasty  1976  . Knee arthroscopy  1981    left knee  . Retinal detachment surgery  1989    left  . Cataract extraction  1995    right eye  . Knee arthroscopy  1998    right knee  . Heart catherization  2001  . Knee surgery   2003    revision of left knee  . Penile prosthesis implant  2005  . Shoulder arthroscopy  2005, 2006    right shoulder  . Knee surgery  1998    left knee lateral release  . Replacement total knee bilateral     No family history on file. History  Substance Use Topics  . Smoking status: Former Smoker -- 19 years    Quit date: 02/21/1966  . Smokeless tobacco: Never Used  . Alcohol Use: No     Comment: quit drinking in 2011    Review of Systems  Unable to perform ROS Constitutional: Positive for fever.  Respiratory: Negative for shortness of breath.   Gastrointestinal: Negative for nausea and vomiting.  Psychiatric/Behavioral: Positive for confusion.    Allergies  Review of patient's allergies indicates no known allergies.  Home Medications   Current Outpatient Rx  Name  Route  Sig  Dispense  Refill  . acetaminophen (TYLENOL) 325 MG tablet   Oral   Take 650 mg by mouth at bedtime.         . cholecalciferol (VITAMIN D) 1000 UNITS tablet   Oral   Take 1,000 Units by mouth every morning.          . clopidogrel (PLAVIX) 75 MG tablet   Oral   Take 75 mg by mouth daily with breakfast.         .  divalproex (DEPAKOTE) 250 MG DR tablet   Oral   Take 375 mg by mouth at bedtime.         . donepezil (ARICEPT) 10 MG tablet   Oral   Take 10 mg by mouth at bedtime.         Marland Kitchen escitalopram (LEXAPRO) 20 MG tablet   Oral   Take 20 mg by mouth every morning.         . famotidine (PEPCID) 20 MG tablet   Oral   Take 20 mg by mouth 2 (two) times daily.         Marland Kitchen glimepiride (AMARYL) 4 MG tablet   Oral   Take 8 mg by mouth daily with breakfast.         . glucagon (GLUCAGEN) 1 MG SOLR injection   Intravenous   Inject 1 mg into the vein once as needed for low blood sugar.         . hydrOXYzine (VISTARIL) 25 MG capsule   Oral   Take 25 mg by mouth 3 (three) times daily.         . insulin detemir (LEVEMIR) 100 UNIT/ML injection   Subcutaneous   Inject  15 Units into the skin 2 (two) times daily.          Marland Kitchen levETIRAcetam (KEPPRA) 500 MG tablet   Oral   Take 500 mg by mouth every morning.          Marland Kitchen lisinopril (PRINIVIL,ZESTRIL) 5 MG tablet   Oral   Take 5 mg by mouth every morning.          Marland Kitchen LORazepam (ATIVAN) 0.5 MG tablet   Oral   Take 0.5 mg by mouth 2 (two) times daily.          . metFORMIN (GLUCOPHAGE) 1000 MG tablet   Oral   Take 1,000 mg by mouth 2 (two) times daily with a meal.         . metoprolol succinate (TOPROL-XL) 50 MG 24 hr tablet   Oral   Take 50 mg by mouth every morning. Take with or immediately following a meal.         . montelukast (SINGULAIR) 10 MG tablet   Oral   Take 10 mg by mouth every morning.         . Multiple Vitamin (MULTIVITAMIN WITH MINERALS) TABS tablet   Oral   Take 1 tablet by mouth every morning.         . pantoprazole (PROTONIX) 20 MG tablet   Oral   Take 20 mg by mouth daily.          BP 157/75  Pulse 118  Temp(Src) 103 F (39.4 C) (Rectal)  Resp 18  SpO2 91% Physical Exam  Nursing note and vitals reviewed. Constitutional: He is oriented to person, place, and time. He appears well-developed and well-nourished. No distress.  Hot to the touch  HENT:  Head: Normocephalic and atraumatic.  Mouth/Throat: Oropharynx is clear and moist. Mucous membranes are dry.  Eyes: Conjunctivae and EOM are normal. Pupils are equal, round, and reactive to light.  Neck: Normal range of motion. Neck supple.  Cardiovascular: Regular rhythm and intact distal pulses.  Tachycardia present.   No murmur heard. Pulmonary/Chest: Effort normal. No respiratory distress. He has no wheezes. He has rhonchi. He has no rales.  Abdominal: Soft. He exhibits no distension. There is no tenderness. There is no rebound and no guarding.  Musculoskeletal: Normal range of  motion. He exhibits no edema and no tenderness.  Neurological: He is alert and oriented to person, place, and time.  Skin: Skin  is warm and dry. No rash noted. No erythema.  Psychiatric: He has a normal mood and affect. His behavior is normal.    ED Course  Procedures (including critical care time) Labs Review Labs Reviewed  CBC WITH DIFFERENTIAL - Abnormal; Notable for the following:    WBC 12.1 (*)    Hemoglobin 12.8 (*)    HCT 37.4 (*)    Neutrophils Relative % 91 (*)    Lymphocytes Relative 4 (*)    Neutro Abs 11.0 (*)    Lymphs Abs 0.5 (*)    All other components within normal limits  COMPREHENSIVE METABOLIC PANEL - Abnormal; Notable for the following:    Potassium 5.7 (*)    CO2 15 (*)    Glucose, Bld 347 (*)    BUN 34 (*)    Creatinine, Ser 1.91 (*)    Albumin 3.2 (*)    GFR calc non Af Amer 32 (*)    GFR calc Af Amer 37 (*)    All other components within normal limits  URINALYSIS, ROUTINE W REFLEX MICROSCOPIC - Abnormal; Notable for the following:    APPearance CLOUDY (*)    Bilirubin Urine SMALL (*)    Ketones, ur 15 (*)    Leukocytes, UA SMALL (*)    All other components within normal limits  GLUCOSE, CAPILLARY - Abnormal; Notable for the following:    Glucose-Capillary 306 (*)    All other components within normal limits  URINE MICROSCOPIC-ADD ON - Abnormal; Notable for the following:    Casts HYALINE CASTS (*)    All other components within normal limits  CG4 I-STAT (LACTIC ACID)   Imaging Review Dg Chest 2 View  02/23/2013   CLINICAL DATA:  Cough  EXAM: CHEST  2 VIEW  COMPARISON:  02/22/2011  FINDINGS: Normal heart size. Consolidation at the lung bases. No pneumothorax. Thorax is rotated to the right.  IMPRESSION: Bibasilar consolidation.   Electronically Signed   By: Maryclare Bean M.D.   On: 02/23/2013 13:29    EKG Interpretation    Date/Time:  Saturday February 23 2013 13:13:58 EST Ventricular Rate:  118 PR Interval:  149 QRS Duration: 95 QT Interval:  309 QTC Calculation: 433 R Axis:   -19 Text Interpretation:  Sinus tachycardia Inferior infarct, old No significant change  since last tracing Confirmed by Anitra Lauth  MD, Cederick Broadnax ([id number]) on 02/23/2013 1:36:14 PM            MDM   1. Healthcare-associated pneumonia   2. Acute renal insufficiency   3. Dehydration     Patient being brought in by EMS for altered mental status and hypoglycemia earlier today. Patient was tachycardic when EMS arrived at the facility and after 800 cc of IV fluid pulse improved from 03/02/2016. Patient will answer yes and no questions and is awake. He denies shortness of breath but is coughing on exam and feels hot to the touch. Concern for underlying infection as the source of his symptoms today. Chest x-ray, EKG, CBC, CMP, UA, lactate pending. Patient given IV fluids.  1:57 PM Pt with signs of pneumonia today.  Leukocytosis and acute renal insufficiency.  Will admit and given IVF and abx.  Gwyneth Sprout, MD 02/23/13 1422  Gwyneth Sprout, MD 02/23/13 [phone]

## 2013-02-23 NOTE — ED Notes (Signed)
Bed: [id number] Expected date: 02/23/13 Expected time: 12:13 PM Means of arrival:  Comments: Hyperglycemia

## 2013-02-23 NOTE — Progress Notes (Signed)
Per lab, pt positive for MRSA.  Pt placed on contact precautions and triad extender notified.

## 2013-02-23 NOTE — Progress Notes (Signed)
[facility] History and Physical  [name] Walker ZOX:[id number] DOB: May 28, 1931 DOA: 02/23/2013  Referring physician: EDP PCP: Minda Meo, MD   Chief Complaint: AMS  HPI: [name] Walker is a 80 y.o. male SNF resident withpast medical history significant for diabetes mellitus, seizures hypertension and as listed below who presents from nursing facility with altered mental status. It is reported that earlier today he was found to have altered mental status and his blood glucose was 47. He was given glucagon and following that his blood sugars stayed elevated. Pt is unable to give history and it is obtained from EDP in chart review. In the ED he was found to be febrile to 103, and labs revealed a glucose of 347 with CO2 of 15 anion gap of 21, k 5.7 and creatinine of 1.9. His last creatinine and 2013 was 1.05. Patient also had a chest x-ray done which revealed by basilar consolidation, and he was found to have a white count of 12, and lactic acid within normal limits at 1.92. He was started on empiric antibiotics in the ED and is admitted for further evaluation and management. Patient admits to coughing all day, but otherwise does not answer any other questions.   Review of Systems As per history of present illness, otherwise unobtainable.   Past Medical History  Diagnosis Date  . Coronary artery disease   . Diabetes mellitus   . Hypertension   . Hyperlipidemia   . Headache(784.0)   . Seizure   . Arthritis   . Anemia   . Blind left eye   . Subdural hematoma     hx recurrent right subdural hematoma   Past Surgical History  Procedure Laterality Date  . Appendectomy      AGE 40  . Rhino-septoplasty  1976  . Knee arthroscopy  1981    left knee  . Retinal detachment surgery  1989    left  . Cataract extraction  1995    right eye  . Knee arthroscopy  1998    right knee  . Heart catherization  2001  . Knee surgery  2003    revision of left knee  . Penile  prosthesis implant  2005  . Shoulder arthroscopy  2005, 2006    right shoulder  . Knee surgery  1998    left knee lateral release  . Replacement total knee bilateral     Social History:  reports that he quit smoking about 47 years ago. He has never used smokeless tobacco. He reports that he does not drink alcohol or use illicit drugs.  No Known Allergies  No family history on file.   Prior to Admission medications   Medication Sig Start Date End Date Taking? Authorizing Provider  acetaminophen (TYLENOL) 325 MG tablet Take 650 mg by mouth at bedtime.   Yes Historical Provider, MD  cholecalciferol (VITAMIN D) 1000 UNITS tablet Take 1,000 Units by mouth every morning.    Yes Historical Provider, MD  clopidogrel (PLAVIX) 75 MG tablet Take 75 mg by mouth daily with breakfast.   Yes Historical Provider, MD  divalproex (DEPAKOTE) 250 MG DR tablet Take 375 mg by mouth at bedtime.   Yes Historical Provider, MD  donepezil (ARICEPT) 10 MG tablet Take 10 mg by mouth at bedtime.   Yes Historical Provider, MD  escitalopram (LEXAPRO) 20 MG tablet Take 20 mg by mouth every morning.   Yes Historical Provider, MD  famotidine (PEPCID) 20 MG tablet Take 20 mg by mouth  2 (two) times daily.   Yes Historical Provider, MD  glimepiride (AMARYL) 4 MG tablet Take 8 mg by mouth daily with breakfast.   Yes Historical Provider, MD  glucagon (GLUCAGEN) 1 MG SOLR injection Inject 1 mg into the vein once as needed for low blood sugar.   Yes Historical Provider, MD  hydrOXYzine (VISTARIL) 25 MG capsule Take 25 mg by mouth 3 (three) times daily.   Yes Historical Provider, MD  insulin detemir (LEVEMIR) 100 UNIT/ML injection Inject 15 Units into the skin 2 (two) times daily.    Yes Historical Provider, MD  levETIRAcetam (KEPPRA) 500 MG tablet Take 500 mg by mouth every morning.    Yes Historical Provider, MD  lisinopril (PRINIVIL,ZESTRIL) 5 MG tablet Take 5 mg by mouth every morning.    Yes Historical Provider, MD  LORazepam  (ATIVAN) 0.5 MG tablet Take 0.5 mg by mouth 2 (two) times daily.    Yes Historical Provider, MD  metFORMIN (GLUCOPHAGE) 1000 MG tablet Take 1,000 mg by mouth 2 (two) times daily with a meal.   Yes Historical Provider, MD  metoprolol succinate (TOPROL-XL) 50 MG 24 hr tablet Take 50 mg by mouth every morning. Take with or immediately following a meal.   Yes Historical Provider, MD  montelukast (SINGULAIR) 10 MG tablet Take 10 mg by mouth every morning.   Yes Historical Provider, MD  Multiple Vitamin (MULTIVITAMIN WITH MINERALS) TABS tablet Take 1 tablet by mouth every morning.   Yes Historical Provider, MD  pantoprazole (PROTONIX) 20 MG tablet Take 20 mg by mouth daily.   Yes Historical Provider, MD   Physical Exam: Filed Vitals:   02/23/13 1400  BP: 127/71  Pulse:   Temp:   Resp: 29    BP 127/71  Pulse 118  Temp(Src) 103 F (39.4 C) (Rectal)  Resp 29  Ht 5\' 9"  (1.753 m)  Wt 72.576 kg (160 lb)  BMI 23.62 kg/m2  SpO2 91% Constitutional: Vital signs reviewed.  Patient is a well-developed and well-nourished  in no acute distress and cooperative with exam. Alert but does not answer to any questions regarding orientation, although selectively answers some questions. Head: Normocephalic and atraumatic Ear: TM normal bilaterally Nose: No erythema or drainage noted.  Turbinates normal Mouth: no erythema or exudates, MMM Eyes: PERRL, EOMI, conjunctivae normal, No scleral icterus.  Neck: Supple, Trachea midline normal ROM, No JVD, mass, thyromegaly, or carotid bruit present.  Cardiovascular: RRR, S1 normal, S2 normal, no MRG, pulses symmetric and intact bilaterally Pulmonary/Chest: normal respiratory effort, CTAB, no wheezes, rales, or rhonchi Abdominal: Soft. Non-tender, non-distended, bowel sounds are normal, no masses, organomegaly, or guarding present.  GU: no CVA tenderness Musculoskeletal: No joint deformities, erythema, or stiffness, ROM full and no nontender Hematology: no cervical,  inginal, or axillary adenopathy.  Neurological:   cranial nerve II-XII are grossly intact, no focal motor deficit, sensory grossly intact, moves all extremities Skin: Warm, dry and intact. No rash, cyanosis, or clubbing.  Psychiatric: Flat affect.               Labs on Admission:  Basic Metabolic Panel:  Recent Labs Lab 02/23/13 1300  NA 139  K 5.7*  CL 103  CO2 15*  GLUCOSE 347*  BUN 34*  CREATININE 1.91*  CALCIUM 8.4   Liver Function Tests:  Recent Labs Lab 02/23/13 1300  AST 13  ALT 13  ALKPHOS 69  BILITOT 0.6  PROT 6.8  ALBUMIN 3.2*   No results found for this basename:  LIPASE, AMYLASE,  in the last 168 hours No results found for this basename: AMMONIA,  in the last 168 hours CBC:  Recent Labs Lab 02/23/13 1300  WBC 12.1*  NEUTROABS 11.0*  HGB 12.8*  HCT 37.4*  MCV 85.6  PLT 287   Cardiac Enzymes: No results found for this basename: CKTOTAL, CKMB, CKMBINDEX, TROPONINI,  in the last 168 hours  BNP (last 3 results) No results found for this basename: PROBNP,  in the last 8760 hours CBG:  Recent Labs Lab 02/23/13 1308 02/23/13 1737  GLUCAP 306* 342*    Radiological Exams on Admission: Dg Chest 2 View  02/23/2013   CLINICAL DATA:  Cough  EXAM: CHEST  2 VIEW  COMPARISON:  02/22/2011  FINDINGS: Normal heart size. Consolidation at the lung bases. No pneumothorax. Thorax is rotated to the right.  IMPRESSION: Bibasilar consolidation.   Electronically Signed   By: Maryclare BeanArt  Hoss M.D.   On: 02/23/2013 13:29     Assessment/Plan Active Problems: HCAP (healthcare-associated pneumonia) -As discussed above, patient presenting with cough fevers 103 and x-rays with consolidation at lung bases bilaterally -Will obtain blood cultures and place on empiric antibiotics with bank and cefepime -Will also obtain influenza PCR and start on empiric Tamiflu pending results SIRS -Secondary to above, obtain cultures and studies as above -empiric antibiotics as above,  follow.   DKA, type 2 -Likely precipitated by infection. -Although it is  noted as discussed above that he was hypoglycemic earlier this a.m. he presents with hyperglycemia CO2 of 15 with an anion gap of 21 -Will place on IV insulin via glucose stabilizer, monitor Accu-Cheks closely as per protocol -Bmet Q4hrs, follow and transition to subcutaneous insulin only after anion gap closes and BGcontrolled as per glucose stabilizer protocol DIABETES MELLITUS, TYPE II, ON INSULIN -Management as discussed above ARF (acute renal failure)  -Likely due to dehydration in setting of ACE inhibitor and above. -Hold ACE inhibitor, hydrate follow recheck Encephalopathy acute -Unclear baseline as he seems to have history of dementia(on Aricept) Hyperkalemia -Recheck k on an insulin drip, and further treat accordingly   COPD -Stable, when necessary bronchodilators and follow.   HTN (hypertension) -Hold lisinopril as above monitor and further treat accordingly. Continue metoprolol   CAD (coronary artery disease)   -Chest pain free, continue outpatient medications the           Code Status: full Family Communication: none at bedside Disposition Plan: Admit to step down  Time spent:   [facility]dingVIYUOH,Alphons Burgert C [facility] Pager [phone]

## 2013-02-24 DIAGNOSIS — E875 Hyperkalemia: Secondary | ICD-10-CM

## 2013-02-24 DIAGNOSIS — G9340 Encephalopathy, unspecified: Secondary | ICD-10-CM

## 2013-02-24 DIAGNOSIS — I2510 Atherosclerotic heart disease of native coronary artery without angina pectoris: Secondary | ICD-10-CM

## 2013-02-24 DIAGNOSIS — E860 Dehydration: Secondary | ICD-10-CM

## 2013-02-24 DIAGNOSIS — N289 Disorder of kidney and ureter, unspecified: Secondary | ICD-10-CM

## 2013-02-24 DIAGNOSIS — E1010 Type 1 diabetes mellitus with ketoacidosis without coma: Secondary | ICD-10-CM

## 2013-02-24 LAB — BASIC METABOLIC PANEL
BUN: 33 mg/dL — AB (ref 6–23)
BUN: 29 mg/dL — AB (ref 6–23)
BUN: 27 mg/dL — AB (ref 6–23)
CALCIUM: 8 mg/dL — AB (ref 8.4–10.5)
CHLORIDE: 109 meq/L (ref 96–112)
CO2: 15 meq/L — AB (ref 19–32)
CO2: 17 mEq/L — ABNORMAL LOW (ref 19–32)
CO2: 19 meq/L (ref 19–32)
CREATININE: 1.6 mg/dL — AB (ref 0.50–1.35)
CREATININE: 1.48 mg/dL — AB (ref 0.50–1.35)
Calcium: 8 mg/dL — ABNORMAL LOW (ref 8.4–10.5)
Calcium: 8.3 mg/dL — ABNORMAL LOW (ref 8.4–10.5)
Chloride: 108 mEq/L (ref 96–112)
Chloride: 110 mEq/L (ref 96–112)
Creatinine, Ser: 1.73 mg/dL — ABNORMAL HIGH (ref 0.50–1.35)
GFR calc Af Amer: 41 mL/min — ABNORMAL LOW (ref >90)
GFR calc Af Amer: 46 mL/min — ABNORMAL LOW (ref >90)
GFR calc Af Amer: 50 mL/min — ABNORMAL LOW (ref >90)
GFR calc non Af Amer: 43 mL/min — ABNORMAL LOW (ref >90)
GFR, EST NON AFRICAN AMERICAN: 36 mL/min — AB (ref >90)
GFR, EST NON AFRICAN AMERICAN: 39 mL/min — AB (ref >90)
GLUCOSE: 182 mg/dL — AB (ref 70–99)
GLUCOSE: 183 mg/dL — AB (ref 70–99)
Glucose, Bld: 182 mg/dL — ABNORMAL HIGH (ref 70–99)
POTASSIUM: 4.8 meq/L (ref 3.7–5.3)
Potassium: 4.4 mEq/L (ref 3.7–5.3)
Potassium: 4.3 mEq/L (ref 3.7–5.3)
Sodium: 141 mEq/L (ref 137–147)
Sodium: 141 mEq/L (ref 137–147)
Sodium: 143 mEq/L (ref 137–147)

## 2013-02-24 LAB — GLUCOSE, CAPILLARY
GLUCOSE-CAPILLARY: 171 mg/dL — AB (ref 70–99)
GLUCOSE-CAPILLARY: 187 mg/dL — AB (ref 70–99)
GLUCOSE-CAPILLARY: 163 mg/dL — AB (ref 70–99)
GLUCOSE-CAPILLARY: 139 mg/dL — AB (ref 70–99)
Glucose-Capillary: 131 mg/dL — ABNORMAL HIGH (ref 70–99)
Glucose-Capillary: 135 mg/dL — ABNORMAL HIGH (ref 70–99)
Glucose-Capillary: 188 mg/dL — ABNORMAL HIGH (ref 70–99)
Glucose-Capillary: 122 mg/dL — ABNORMAL HIGH (ref 70–99)
Glucose-Capillary: 150 mg/dL — ABNORMAL HIGH (ref 70–99)
Glucose-Capillary: 198 mg/dL — ABNORMAL HIGH (ref 70–99)
Glucose-Capillary: 129 mg/dL — ABNORMAL HIGH (ref 70–99)
Glucose-Capillary: 182 mg/dL — ABNORMAL HIGH (ref 70–99)
Glucose-Capillary: 190 mg/dL — ABNORMAL HIGH (ref 70–99)
Glucose-Capillary: 164 mg/dL — ABNORMAL HIGH (ref 70–99)
Glucose-Capillary: 215 mg/dL — ABNORMAL HIGH (ref 70–99)

## 2013-02-24 LAB — CBC
HEMATOCRIT: 30.3 % — AB (ref 39.0–52.0)
HEMOGLOBIN: 10.3 g/dL — AB (ref 13.0–17.0)
MCH: 29 pg (ref 26.0–34.0)
MCHC: 34 g/dL (ref 30.0–36.0)
MCV: 85.4 fL (ref 78.0–100.0)
Platelets: 270 10*3/uL (ref 150–400)
RBC: 3.55 MIL/uL — AB (ref 4.22–5.81)
RDW: 13.2 % (ref 11.5–15.5)
WBC: 20.1 10*3/uL — AB (ref 4.0–10.5)

## 2013-02-24 LAB — INFLUENZA PANEL BY PCR (TYPE A & B)
H1N1FLUPCR: NOT DETECTED
INFLAPCR: NEGATIVE
INFLBPCR: NEGATIVE

## 2013-02-24 MED ORDER — MUPIROCIN 2 % EX OINT
1.0000 "application " | TOPICAL_OINTMENT | Freq: Two times a day (BID) | CUTANEOUS | Status: DC
  Administered 2013-02-24 – 2013-02-26 (×5): 1 via NASAL
  Filled 2013-02-24: qty 22

## 2013-02-24 MED ORDER — INSULIN GLARGINE 100 UNIT/ML ~~LOC~~ SOLN
10.0000 [IU] | Freq: Every day | SUBCUTANEOUS | Status: DC
  Administered 2013-02-24 – 2013-02-26 (×3): 10 [IU] via SUBCUTANEOUS
  Filled 2013-02-24 (×3): qty 0.1

## 2013-02-24 MED ORDER — VALPROIC ACID 250 MG/5ML PO SYRP
375.0000 mg | ORAL_SOLUTION | Freq: Every day | ORAL | Status: DC
  Administered 2013-02-24 – 2013-02-25 (×2): 375 mg via ORAL
  Filled 2013-02-24 (×3): qty 7.5

## 2013-02-24 MED ORDER — INSULIN ASPART 100 UNIT/ML ~~LOC~~ SOLN
0.0000 [IU] | Freq: Every day | SUBCUTANEOUS | Status: DC
  Administered 2013-02-24: 3 [IU] via SUBCUTANEOUS
  Administered 2013-02-25: 2 [IU] via SUBCUTANEOUS

## 2013-02-24 MED ORDER — ENOXAPARIN SODIUM 40 MG/0.4ML ~~LOC~~ SOLN
40.0000 mg | SUBCUTANEOUS | Status: DC
  Administered 2013-02-24 – 2013-02-25 (×2): 40 mg via SUBCUTANEOUS
  Filled 2013-02-24 (×3): qty 0.4

## 2013-02-24 MED ORDER — CHLORHEXIDINE GLUCONATE CLOTH 2 % EX PADS
6.0000 | MEDICATED_PAD | Freq: Every day | CUTANEOUS | Status: DC
  Administered 2013-02-24 – 2013-02-26 (×3): 6 via TOPICAL

## 2013-02-24 MED ORDER — INSULIN ASPART 100 UNIT/ML ~~LOC~~ SOLN
0.0000 [IU] | Freq: Three times a day (TID) | SUBCUTANEOUS | Status: DC
  Administered 2013-02-24: 5 [IU] via SUBCUTANEOUS
  Administered 2013-02-24 – 2013-02-25 (×3): 3 [IU] via SUBCUTANEOUS
  Administered 2013-02-25: 5 [IU] via SUBCUTANEOUS
  Administered 2013-02-26: 3 [IU] via SUBCUTANEOUS

## 2013-02-24 NOTE — Progress Notes (Signed)
[facility] PROGRESS NOTE  [name] Walker [id number] DOB: 05/09/1931 DOA: 02/23/2013 PCP: Minda MeoARONSON,RICHARD A, MD  Assessment/Plan:   DKA, type 2/DIABETES MELLITUS, TYPE II, ON INSULIN - At this point that DKA resolving. Patient will be switched to insulin subcutaneous - Anion gap 14 with last CO2 at 19 - Blood sugars much improved. Suspect elevated blood sugars were secondary to active infection    COPD - Appears compensated, no wheezes on exam.    HTN (hypertension) -Patient currently on metoprolol with relatively well controlled blood pressures.  -Continue to monitor    CAD (coronary artery disease) - Stable continue Plavix    Encephalopathy acute - Most likely secondary to principal problem list above. Patient responding to questions appropriately    Healthcare-associated pneumonia - Continue current antibiotic regimen, cefepime and vancomycin. - Discontinue Tamiflu since influenza panel negative    ARF (acute renal failure) - Most likely secondary to principal problem and loss of fluids. Improved with IV fluid rehydration and resolution of DKA. - Continue to monitor serum creatinine    Hyperkalemia - Resolved   Code Status: Patient DO NOT RESUSCITATE  Family Communication: No family at bedside  Disposition Plan: Transfer to telemetry floor given resolution of DKA   Consultants:  None  Procedures:  None  Antibiotics:  As listed above  HPI/Subjective: Patient has no new complaints. He states he feels better.   Objective: Filed Vitals:   02/24/13 1200  BP: 135/55  Pulse: 90  Temp: 99.5 F (37.5 C)  Resp: 19    Intake/Output Summary (Last 24 hours) at 02/24/13 1433 Last data filed at 02/24/13 1039  Gross per 24 hour  Intake 1759.73 ml  Output      0 ml  Net 1759.73 ml   Filed Weights   02/23/13 1429  Weight: 72.576 kg (160 lb)    Exam:   General:  Patient in no acute distress, alert, awake   Cardiovascular: Regular rate and  rhythm, no murmurs or rub   Respiratory: Breathing comfortably on room air, no increased work of breathing, no wheezes   Abdomen: Soft, nondistended, nontender   Musculoskeletal: No cyanosis, no clubbing  Data Reviewed: Basic Metabolic Panel:  Recent Labs Lab 02/23/13 1855 02/23/13 2240 02/24/13 0217 02/24/13 0732 02/24/13 1100  NA 135* 140 141 141 143  K 6.1* 4.9 4.8 4.4 4.3  CL 103 108 108 109 110  CO2 14* 15* 15* 17* 19  GLUCOSE 418* 182* 182* 183* 182*  BUN 36* 33* 33* 29* 27*  CREATININE 2.00* 1.82* 1.73* 1.60* 1.48*  CALCIUM 7.8* 7.8* 8.0* 8.0* 8.3*   Liver Function Tests:  Recent Labs Lab 02/23/13 1300  AST 13  ALT 13  ALKPHOS 69  BILITOT 0.6  PROT 6.8  ALBUMIN 3.2*   No results found for this basename: LIPASE, AMYLASE,  in the last 168 hours No results found for this basename: AMMONIA,  in the last 168 hours CBC:  Recent Labs Lab 02/23/13 1300 02/24/13 0217  WBC 12.1* 20.1*  NEUTROABS 11.0*  --   HGB 12.8* 10.3*  HCT 37.4* 30.3*  MCV 85.6 85.4  PLT 287 270   Cardiac Enzymes: No results found for this basename: CKTOTAL, CKMB, CKMBINDEX, TROPONINI,  in the last 168 hours BNP (last 3 results) No results found for this basename: PROBNP,  in the last 8760 hours CBG:  Recent Labs Lab 02/24/13 0715 02/24/13 0825 02/24/13 0930 02/24/13 1036 02/24/13 1204  GLUCAP 150* 190* 198* 182* 164*  Recent Results (from the past 240 hour(s))  MRSA PCR SCREENING     Status: Abnormal   Collection Time    02/23/13  6:54 PM      Result Value Range Status   MRSA by PCR POSITIVE (*) NEGATIVE Final   Comment:            The GeneXpert MRSA Assay (FDA     approved for NASAL specimens     only), is one component of a     comprehensive MRSA colonization     surveillance program. It is not     intended to diagnose MRSA     infection nor to guide or     monitor treatment for     MRSA infections.     RESULT CALLED TO, READ BACK BY AND VERIFIED WITH:      Felicita Gage [id number] @ 2016 BY J SCOTTON     Studies: Dg Chest 2 View  02/23/2013   CLINICAL DATA:  Cough  EXAM: CHEST  2 VIEW  COMPARISON:  02/22/2011  FINDINGS: Normal heart size. Consolidation at the lung bases. No pneumothorax. Thorax is rotated to the right.  IMPRESSION: Bibasilar consolidation.   Electronically Signed   By: Maryclare Bean M.D.   On: 02/23/2013 13:29    Scheduled Meds: . ceFEPime (MAXIPIME) IV  1 g Intravenous Q24H  . Chlorhexidine Gluconate Cloth  6 each Topical Q0600  . cholecalciferol  1,000 Units Oral q morning - 10a  . clopidogrel  75 mg Oral Q breakfast  . divalproex  375 mg Oral QHS  . donepezil  10 mg Oral QHS  . enoxaparin (LOVENOX) injection  40 mg Subcutaneous Q24H  . escitalopram  20 mg Oral q morning - 10a  . famotidine  20 mg Oral BID  . hydrOXYzine  25 mg Oral TID  . insulin aspart  0-15 Units Subcutaneous TID WC  . insulin aspart  0-5 Units Subcutaneous QHS  . insulin glargine  10 Units Subcutaneous Daily  . levETIRAcetam  500 mg Oral q morning - 10a  . metoprolol succinate  50 mg Oral q morning - 10a  . montelukast  10 mg Oral q morning - 10a  . multivitamin with minerals  1 tablet Oral q morning - 10a  . mupirocin ointment  1 application Nasal BID  . oseltamivir  75 mg Oral QHS  . pantoprazole  20 mg Oral Daily  . sodium chloride  3 mL Intravenous Q12H  . vancomycin  1,000 mg Intravenous Q24H   Continuous Infusions: . sodium chloride 150 mL/hr at 02/23/13 1833  . dextrose 5 % and 0.45% NaCl 125 mL/hr at 02/24/13 9147    Active Problems:  Time spent: > 35 minutes    Penny Pia  [facility] Pager [phone]. If 7PM-7AM, please contact night-coverage at [email or web address], password [facility] 02/24/2013, 2:33 PM  LOS: 1 day

## 2013-02-25 DIAGNOSIS — E43 Unspecified severe protein-calorie malnutrition: Secondary | ICD-10-CM | POA: Insufficient documentation

## 2013-02-25 LAB — GLUCOSE, CAPILLARY
GLUCOSE-CAPILLARY: 208 mg/dL — AB (ref 70–99)
GLUCOSE-CAPILLARY: 174 mg/dL — AB (ref 70–99)
Glucose-Capillary: 255 mg/dL — ABNORMAL HIGH (ref 70–99)
Glucose-Capillary: 187 mg/dL — ABNORMAL HIGH (ref 70–99)
Glucose-Capillary: 215 mg/dL — ABNORMAL HIGH (ref 70–99)

## 2013-02-25 MED ORDER — LEVOFLOXACIN 750 MG PO TABS
750.0000 mg | ORAL_TABLET | Freq: Once | ORAL | Status: AC
  Administered 2013-02-25: 750 mg via ORAL
  Filled 2013-02-25: qty 1

## 2013-02-25 MED ORDER — GLUCERNA SHAKE PO LIQD
237.0000 mL | Freq: Three times a day (TID) | ORAL | Status: DC
  Administered 2013-02-25 – 2013-02-26 (×3): 237 mL via ORAL
  Filled 2013-02-25 (×5): qty 237

## 2013-02-25 MED ORDER — LEVOFLOXACIN 750 MG PO TABS: 750.0000 mg | ORAL_TABLET | ORAL | Status: DC

## 2013-02-25 NOTE — Progress Notes (Signed)
[facility] PROGRESS NOTE  Elisabeth MostMichael F Piechota VQQ:[id number] DOB: 01/11/1932 DOA: 02/23/2013 PCP: Minda MeoARONSON,RICHARD A, MD  Assessment/Plan:   DKA, type 2/DIABETES MELLITUS, TYPE II, ON INSULIN - At this point that DKA resolved. Patient will be switched to insulin subcutaneous - Blood sugars relatively well controlled on subcutaneous insulin. Will transition to telemetry monitoring - Should patient's blood sugars continue to be relatively well controlled to consider discharging next a.m.    COPD - Appears compensated, no wheezes on exam.    HTN (hypertension) -Patient currently on metoprolol with relatively well controlled blood pressures.  -Continue to monitor    CAD (coronary artery disease) - Stable continue Plavix    Encephalopathy acute - Most likely secondary to principal problem list above.  - Resolved    Healthcare-associated pneumonia - Continue current antibiotic regimen, cefepime and vancomycin. - Discontinue Tamiflu since influenza panel negative    ARF (acute renal failure) - Most likely secondary to principal problem and loss of fluids. Improved with IV fluid rehydration and resolution of DKA. - Continue to monitor serum creatinine. Continues to trend down    Hyperkalemia - Resolved, reassess next a.m.   Code Status: Patient DO NOT RESUSCITATE  Family Communication: No family at bedside  Disposition Plan: Transfer to telemetry floor given resolution of DKA   Consultants:  None  Procedures:  None  Antibiotics:  As listed above, discontinue IV antibiotics 02/25/2013  Will transition to oral antibiotic regimen 02/25/2013  HPI/Subjective: Patient has no new complaints. No acute issues reported to me overnight.  Objective: Filed Vitals:   02/25/13 1200  BP: 131/72  Pulse: 63  Temp: 98.1 F (36.7 C)  Resp: 15    Intake/Output Summary (Last 24 hours) at 02/25/13 1457 Last data filed at 02/25/13 1300  Gross per 24 hour  Intake    550 ml   Output   1090 ml  Net   -540 ml   Filed Weights   02/23/13 1429 02/25/13 0400  Weight: 72.576 kg (160 lb) 64.5 kg (142 lb 3.2 oz)    Exam:   General:  Patient in no acute distress, alert, awake   Cardiovascular: Regular rate and rhythm, no murmurs or rub   Respiratory: Breathing comfortably on room air, no increased work of breathing, no wheezes   Abdomen: Soft, nondistended, nontender   Musculoskeletal: No cyanosis, no clubbing  Data Reviewed: Basic Metabolic Panel:  Recent Labs Lab 02/23/13 1855 02/23/13 2240 02/24/13 0217 02/24/13 0732 02/24/13 1100  NA 135* 140 141 141 143  K 6.1* 4.9 4.8 4.4 4.3  CL 103 108 108 109 110  CO2 14* 15* 15* 17* 19  GLUCOSE 418* 182* 182* 183* 182*  BUN 36* 33* 33* 29* 27*  CREATININE 2.00* 1.82* 1.73* 1.60* 1.48*  CALCIUM 7.8* 7.8* 8.0* 8.0* 8.3*   Liver Function Tests:  Recent Labs Lab 02/23/13 1300  AST 13  ALT 13  ALKPHOS 69  BILITOT 0.6  PROT 6.8  ALBUMIN 3.2*   No results found for this basename: LIPASE, AMYLASE,  in the last 168 hours No results found for this basename: AMMONIA,  in the last 168 hours CBC:  Recent Labs Lab 02/23/13 1300 02/24/13 0217  WBC 12.1* 20.1*  NEUTROABS 11.0*  --   HGB 12.8* 10.3*  HCT 37.4* 30.3*  MCV 85.6 85.4  PLT 287 270   Cardiac Enzymes: No results found for this basename: CKTOTAL, CKMB, CKMBINDEX, TROPONINI,  in the last 168 hours BNP (last 3 results) No results  found for this basename: PROBNP,  in the last 8760 hours CBG:  Recent Labs Lab 02/24/13 1204 02/24/13 1822 02/24/13 2225 02/25/13 0804 02/25/13 1225  GLUCAP 164* 215* 255* 187* 208*    Recent Results (from the past 240 hour(s))  CULTURE, BLOOD (ROUTINE X 2)     Status: None   Collection Time    02/23/13  6:50 PM      Result Value Range Status   Specimen Description BLOOD RIGHT ARM   Final   Special Requests BOTTLES DRAWN AEROBIC AND ANAEROBIC 5CC EACH   Final   Culture  Setup Time     Final    Value: 02/24/2013 05:37     Performed at [facility]   Culture     Final   Value:        BLOOD CULTURE RECEIVED NO GROWTH TO DATE CULTURE WILL BE HELD FOR 5 DAYS BEFORE ISSUING A FINAL NEGATIVE REPORT     Performed at [facility]   Report Status PENDING   Incomplete  MRSA PCR SCREENING     Status: Abnormal   Collection Time    02/23/13  6:54 PM      Result Value Range Status   MRSA by PCR POSITIVE (*) NEGATIVE Final   Comment:            The GeneXpert MRSA Assay (FDA     approved for NASAL specimens     only), is one component of a     comprehensive MRSA colonization     surveillance program. It is not     intended to diagnose MRSA     infection nor to guide or     monitor treatment for     MRSA infections.     RESULT CALLED TO, READ BACK BY AND VERIFIED WITH:     MICHELLE REEVES,RN [id number] @ 2016 BY J SCOTTON  CULTURE, BLOOD (ROUTINE X 2)     Status: None   Collection Time    02/23/13  6:55 PM      Result Value Range Status   Specimen Description BLOOD RIGHT HAND   Final   Special Requests BOTTLES DRAWN AEROBIC ONLY 4CC   Final   Culture  Setup Time     Final   Value: 02/24/2013 05:37     Performed at [facility]   Culture     Final   Value:        BLOOD CULTURE RECEIVED NO GROWTH TO DATE CULTURE WILL BE HELD FOR 5 DAYS BEFORE ISSUING A FINAL NEGATIVE REPORT     Performed at [facility]   Report Status PENDING   Incomplete     Studies: No results found.  Scheduled Meds: . ceFEPime (MAXIPIME) IV  1 g Intravenous Q24H  . Chlorhexidine Gluconate Cloth  6 each Topical Q0600  . cholecalciferol  1,000 Units Oral q morning - 10a  . clopidogrel  75 mg Oral Q breakfast  . donepezil  10 mg Oral QHS  . enoxaparin (LOVENOX) injection  40 mg Subcutaneous Q24H  . escitalopram  20 mg Oral q morning - 10a  . famotidine  20 mg Oral BID  . feeding supplement (GLUCERNA SHAKE)  237 mL Oral TID BM  . hydrOXYzine  25 mg Oral TID  . insulin aspart   0-15 Units Subcutaneous TID WC  . insulin aspart  0-5 Units Subcutaneous QHS  . insulin glargine  10 Units Subcutaneous Daily  . levETIRAcetam  500 mg Oral q morning - 10a  . metoprolol succinate  50 mg Oral q morning - 10a  . montelukast  10 mg Oral q morning - 10a  . multivitamin with minerals  1 tablet Oral q morning - 10a  . mupirocin ointment  1 application Nasal BID  . pantoprazole  20 mg Oral Daily  . sodium chloride  3 mL Intravenous Q12H  . Valproic Acid  375 mg Oral QHS  . vancomycin  1,000 mg Intravenous Q24H   Continuous Infusions: . sodium chloride 150 mL/hr at 02/23/13 1833  . dextrose 5 % and 0.45% NaCl 125 mL/hr at 02/24/13 1610    Active Problems:  Time spent: > 35 minutes    [name] Walker  [facility] Pager [phone]. If 7PM-7AM, please contact night-coverage at [email or web address], password [facility] 02/25/2013, 2:57 PM  LOS: 2 days

## 2013-02-25 NOTE — Progress Notes (Signed)
ANTIBIOTIC CONSULT NOTE - INITIAL  Pharmacy Consult for Levaquin Indication: Pneumonia  No Known Allergies  Patient Measurements: Height: 5\' 9"  (175.3 cm) Weight: 142 lb 3.2 oz (64.5 kg) IBW/kg (Calculated) : 70.7 Adjusted Body Weight:   Vital Signs: Temp: 98.1 F (36.7 C) (01/26 1200) Temp src: Axillary (01/26 1200) BP: 131/72 mmHg (01/26 1200) Pulse Rate: 63 (01/26 1200) Intake/Output from previous day: 01/25 0701 - 01/26 0700 In: 495.1 [P.O.:120; I.V.:125.1; IV Piggyback:250] Out: 950 [Urine:950] Intake/Output from this shift: Total I/O In: 60 [I.V.:60] Out: 140 [Urine:140]  Labs:  Recent Labs  02/23/13 1300  02/24/13 0217 02/24/13 0732 02/24/13 1100  WBC 12.1*  --  20.1*  --   --   HGB 12.8*  --  10.3*  --   --   PLT 287  --  270  --   --   CREATININE 1.91*  < > 1.73* 1.60* 1.48*  < > = values in this interval not displayed. Estimated Creatinine Clearance: 36.9 ml/min (by C-G formula based on Cr of 1.48). No results found for this basename: VANCOTROUGH, Leodis Binet, VANCORANDOM, GENTTROUGH, GENTPEAK, GENTRANDOM, TOBRATROUGH, TOBRAPEAK, TOBRARND, AMIKACINPEAK, AMIKACINTROU, AMIKACIN,  in the last 72 hours   Microbiology: Recent Results (from the past 720 hour(s))  CULTURE, BLOOD (ROUTINE X 2)     Status: None   Collection Time    02/23/13  6:50 PM      Result Value Range Status   Specimen Description BLOOD RIGHT ARM   Final   Special Requests BOTTLES DRAWN AEROBIC AND ANAEROBIC 5CC EACH   Final   Culture  Setup Time     Final   Value: 02/24/2013 05:37     Performed at [facility]   Culture     Final   Value:        BLOOD CULTURE RECEIVED NO GROWTH TO DATE CULTURE WILL BE HELD FOR 5 DAYS BEFORE ISSUING A FINAL NEGATIVE REPORT     Performed at [facility]   Report Status PENDING   Incomplete  MRSA PCR SCREENING     Status: Abnormal   Collection Time    02/23/13  6:54 PM      Result Value Range Status   MRSA by PCR POSITIVE (*) NEGATIVE  Final   Comment:            The GeneXpert MRSA Assay (FDA     approved for NASAL specimens     only), is one component of a     comprehensive MRSA colonization     surveillance program. It is not     intended to diagnose MRSA     infection nor to guide or     monitor treatment for     MRSA infections.     RESULT CALLED TO, READ BACK BY AND VERIFIED WITH:     MICHELLE REEVES,RN [id number] @ 2016 BY J SCOTTON  CULTURE, BLOOD (ROUTINE X 2)     Status: None   Collection Time    02/23/13  6:55 PM      Result Value Range Status   Specimen Description BLOOD RIGHT HAND   Final   Special Requests BOTTLES DRAWN AEROBIC ONLY 4CC   Final   Culture  Setup Time     Final   Value: 02/24/2013 05:37     Performed at [facility]   Culture     Final   Value:        BLOOD CULTURE RECEIVED NO GROWTH  TO DATE CULTURE WILL BE HELD FOR 5 DAYS BEFORE ISSUING A FINAL NEGATIVE REPORT     [facility]   Report Status PENDING   Incomplete    Medical History: Past Medical History  Diagnosis Date  . Coronary artery disease   . Diabetes mellitus   . Hypertension   . Hyperlipidemia   . Headache(784.0)   . Seizure   . Arthritis   . Anemia   . Blind left eye   . Subdural hematoma     hx recurrent right subdural hematoma    Assessment: 81 [facility] and A[facility], found to have HCAP and treated with IV antibiotics.  Today, MD narrowing antibiotics to oral levaquin per pharmacy.  Acute renal failure resolving, currently CrCl ~37 ml/min.   Influenza panel and microbiology negative to date.  NKDA.  Afebrile.  WBC yesterday elevated.   Goal of Therapy:  Eradication of infection  Plan:  Begin levaquin 750mg  PO q 48 hours.  Follow renal function closely.    Haynes Hoehnolleen Meghana Tullo, PharmD, BCPS 02/25/2013, 3:31 PM  Pager: [phone]

## 2013-02-25 NOTE — Progress Notes (Signed)
CARE MANAGEMENT NOTE 02/25/2013  Patient:  [name] MostWILLIAMS,[name] F   Account Number:  [id number]  Date Initiated:  02/25/2013  Documentation initiated by:  Lashay Osborne  Subjective/Objective Assessment:   ams with elevated bld glucose requiring iv insulin drip     Action/Plan:   [facility]   Anticipated DC Date:  02/27/2013   Anticipated DC Plan:  SKILLED NURSING FACILITY  In-house referral  Clinical Social Worker         Choice offered to / List presented to:             Status of service:  In process, will continue to follow Medicare Important Message given?  NA - LOS <3 / Initial given by admissions (If response is "NO", the following Medicare IM given date fields will be blank) Date Medicare IM given:   Date Additional Medicare IM given:    Discharge Disposition:    Per UR Regulation:  Reviewed for med. necessity/level of care/duration of stay  If discussed at Long Length of Stay Meetings, dates discussed:    Comments:  [id number]/Amyre Segundo Earlene PlaterDavis, RN, BSN, CCM, [phone] Chart reviewed for update of needs and condition. Being moved out of sdu to tele floor/iv insulin dcd,iv abx for pna ongoing, ams clearing but does remain

## 2013-02-25 NOTE — Progress Notes (Signed)
Inpatient Diabetes Program Recommendations  AACE/ADA: New Consensus Statement on Inpatient Glycemic Control (2013)  Target Ranges:  Prepandial:   less than 140 mg/dL      Peak postprandial:   less than 180 mg/dL (1-2 hours)      Critically ill patients:  140 - 180 mg/dL   Reason for Visit: Hyperglycemia  Diabetes history: Type 2 DM Outpatient Diabetes medications: Levemir 15 units bid, Amaryl 8 mg QAM, metformin 1000 mg bid  Current orders for Inpatient glycemic control: Lantus 10 units QD and Novolog moderate tidwc    Results for [name] Walker, [name] Walker (MRN [id number]) as of 02/25/2013 17:10  Ref. Range 02/24/2013 18:22 02/24/2013 22:25 02/25/2013 08:04 02/25/2013 12:25 02/25/2013 16:54  Glucose-Capillary Latest Range: 70-99 mg/dL 409215 (H) 811255 (H) 914187 (H) 208 (H) 174 (H)   Blood sugars improving.  May want to change basal insulin to Levemir since pt was on this PTA. Will probably need increase in Lantus to 15 units QAM. HgbA1C would be helpful to assess glycemic control prior to admission.  Will continue to follow. Thank you. Ailene Ardshonda Maleiya Pergola, RD, LDN, CDE Inpatient Diabetes Coordinator [phone]

## 2013-02-25 NOTE — Progress Notes (Signed)
INITIAL NUTRITION ASSESSMENT  Pt meets criteria for severe MALNUTRITION in the context of chronic illness as evidenced by <75% estimated energy intake with 8.3% weight loss in the past month.  DOCUMENTATION CODES Per approved criteria  -Severe malnutrition in the context of chronic illness   INTERVENTION: - Glucerna shakes TID - Encouraged increased meal intake - Will continue to monitor   NUTRITION DIAGNOSIS: Inadequate oral intake related to altered mental status, poor appetite as evidenced by <50% meal intake.   Goal: Pt to consume >90% of meals/supplements  Monitor:  Weights, labs, intake  Reason for Assessment: Low braden   80 y.o. male  Admitting Dx: Hyperglycemia, altered mental status   ASSESSMENT: Pt from nursing home, admitted with hyperglycemia and altered mental status. Hx of DM, seizures, and HTN. Found to have pneumonia. Met with pt who reports eating well PTA, 3 meals/day. Unsure of any changes in weight. Noted pt had not touched his meal tray. Noted pt ate only 30% of dinner last night.   Per telephone conversation with nursing home staff, pt on a liberalized diabetic diet with thin liquids. Staff reports some days he would eat well, other days less. States he weighed 154.8 pounds on January 5th, now down to 142 pounds.   BUN/Cr elevated with low GFR  Height: Ht Readings from Last 1 Encounters:  02/23/13 _0  (1.753 m)    Weight: Wt Readings from Last 1 Encounters:  02/25/13 142 lb 3.2 oz (64.5 kg)    Ideal Body Weight: 160 lb   % Ideal Body Weight: 89%  Wt Readings from Last 10 Encounters:  02/25/13 142 lb 3.2 oz (64.5 kg)  12/08/11 164 lb 3.9 oz (74.5 kg)  05/17/11 167 lb (75.751 kg)  03/17/11 164 lb 8 oz (74.617 kg)  02/22/11 146 lb (66.225 kg)    Usual Body Weight: 154.8 lb on 02/04/13 per nursing home staff  % Usual Body Weight: 92%  BMI:  Body mass index is 20.99 kg/(m^2).  Estimated Nutritional Needs: Kcal: 1900-2000 Protein:  80-100g Fluid: 1.9-2L/day  Skin: Deep tissue injury on sacrum   Diet Order: Carb Control  EDUCATION NEEDS: -No education needs identified at this time   Intake/Output Summary (Last 24 hours) at 02/25/13 1317 Last data filed at 02/25/13 1000  Gross per 24 hour  Intake    520 ml  Output   1090 ml  Net   -570 ml    Last BM: PTA  Labs:   Recent Labs Lab 02/24/13 0217 02/24/13 0732 02/24/13 1100  NA 141 141 143  K 4.8 4.4 4.3  CL 108 109 110  CO2 15* 17* 19  BUN 33* 29* 27*  CREATININE 1.73* 1.60* 1.48*  CALCIUM 8.0* 8.0* 8.3*  GLUCOSE 182* 183* 182*    CBG (last 3)   Recent Labs  02/24/13 2225 02/25/13 0804 02/25/13 1225  GLUCAP 255* 187* 208*    Scheduled Meds: . ceFEPime (MAXIPIME) IV  1 g Intravenous Q24H  . Chlorhexidine Gluconate Cloth  6 each Topical Q0600  . cholecalciferol  1,000 Units Oral q morning - 10a  . clopidogrel  75 mg Oral Q breakfast  . donepezil  10 mg Oral QHS  . enoxaparin (LOVENOX) injection  40 mg Subcutaneous Q24H  . escitalopram  20 mg Oral q morning - 10a  . famotidine  20 mg Oral BID  . hydrOXYzine  25 mg Oral TID  . insulin aspart  0-15 Units Subcutaneous TID WC  . insulin aspart  0-5 Units Subcutaneous QHS  . insulin glargine  10 Units Subcutaneous Daily  . levETIRAcetam  500 mg Oral q morning - 10a  . metoprolol succinate  50 mg Oral q morning - 10a  . montelukast  10 mg Oral q morning - 10a  . multivitamin with minerals  1 tablet Oral q morning - 10a  . mupirocin ointment  1 application Nasal BID  . pantoprazole  20 mg Oral Daily  . sodium chloride  3 mL Intravenous Q12H  . Valproic Acid  375 mg Oral QHS  . vancomycin  1,000 mg Intravenous Q24H    Continuous Infusions: . sodium chloride 150 mL/hr at 02/23/13 1833  . dextrose 5 % and 0.45% NaCl 125 mL/hr at 02/24/13 0932    Past Medical History  Diagnosis Date  . Coronary artery disease   . Diabetes mellitus   . Hypertension   . Hyperlipidemia   .  Headache(784.0)   . Seizure   . Arthritis   . Anemia   . Blind left eye   . Subdural hematoma     hx recurrent right subdural hematoma    Past Surgical History  Procedure Laterality Date  . Appendectomy      AGE 31  . Rhino-septoplasty  1976  . Knee arthroscopy  1981    left knee  . Retinal detachment surgery  1989    left  . Cataract extraction  1995    right eye  . Knee arthroscopy  1998    right knee  . Heart catherization  2001  . Knee surgery  2003    revision of left knee  . Penile prosthesis implant  2005  . Shoulder arthroscopy  2005, 2006    right shoulder  . Knee surgery  1998    left knee lateral release  . Replacement total knee bilateral      Mikey College MS, RD, LDN [phone] Pager [phone] After Hours Pager

## 2013-02-26 DIAGNOSIS — E1101 Type 2 diabetes mellitus with hyperosmolarity with coma: Secondary | ICD-10-CM

## 2013-02-26 LAB — GLUCOSE, CAPILLARY
Glucose-Capillary: 176 mg/dL — ABNORMAL HIGH (ref 70–99)
Glucose-Capillary: 251 mg/dL — ABNORMAL HIGH (ref 70–99)

## 2013-02-26 LAB — BASIC METABOLIC PANEL
BUN: 18 mg/dL (ref 6–23)
CO2: 20 mEq/L (ref 19–32)
Calcium: 8.1 mg/dL — ABNORMAL LOW (ref 8.4–10.5)
Chloride: 105 mEq/L (ref 96–112)
Creatinine, Ser: 1.41 mg/dL — ABNORMAL HIGH (ref 0.50–1.35)
GFR, EST AFRICAN AMERICAN: 53 mL/min — AB (ref >90)
GFR, EST NON AFRICAN AMERICAN: 46 mL/min — AB (ref >90)
Glucose, Bld: 130 mg/dL — ABNORMAL HIGH (ref 70–99)
POTASSIUM: 4.2 meq/L (ref 3.7–5.3)
Sodium: 139 mEq/L (ref 137–147)

## 2013-02-26 LAB — CBC
HCT: 27.2 % — ABNORMAL LOW (ref 39.0–52.0)
HEMOGLOBIN: 9.2 g/dL — AB (ref 13.0–17.0)
MCH: 28.8 pg (ref 26.0–34.0)
MCHC: 33.8 g/dL (ref 30.0–36.0)
MCV: 85 fL (ref 78.0–100.0)
Platelets: 237 10*3/uL (ref 150–400)
RBC: 3.2 MIL/uL — ABNORMAL LOW (ref 4.22–5.81)
RDW: 13 % (ref 11.5–15.5)
WBC: 10.5 10*3/uL (ref 4.0–10.5)

## 2013-02-26 MED ORDER — LORAZEPAM 0.5 MG PO TABS: 0.5000 mg | ORAL_TABLET | Freq: Two times a day (BID) | ORAL | Status: DC

## 2013-02-26 MED ORDER — CEFDINIR 300 MG PO CAPS
300.0000 mg | ORAL_CAPSULE | Freq: Two times a day (BID) | ORAL | Status: DC
Start: 2013-02-26 — End: 2013-11-19

## 2013-02-26 MED ORDER — INSULIN DETEMIR 100 UNIT/ML ~~LOC~~ SOLN: 15.0000 [IU] | Freq: Every day | SUBCUTANEOUS | Status: DC

## 2013-02-26 NOTE — Progress Notes (Signed)
CS[facility] ICU RN re: patient's discharge. [facility], CSW confirmed with Pennie RushingSeth that they would be able to take patient back today. Discharge packet given to RN, Ruby. PTAR scheduled for 12 noon pickup (Service Request Id: [id number]). CSW left message for patient's son, Ree KidaJack making him aware.   Lincoln MaxinKelly Elaysha Bevard, LCSW [facility] Clinical Social Worker cell #: [phone]

## 2013-02-26 NOTE — Discharge Summary (Signed)
Physician Discharge Summary  [name] Walker ZOX:[id number] DOB: 1931/07/11 DOA: 02/23/2013  PCP: Minda Meo, MD  Admit date: 02/23/2013 Discharge date: 02/26/2013  Time spent: > 35 minutes  Recommendations for Outpatient Follow-up:  1. Please be sure to follow up on serum creatinine levels 2. Also decide whether or not to prolong antibiotic therapy on pcp follow up 3. Monitor blood sugars and decide whether or not to adjust hypoglycemic agents. 4. Held lisinopril on d/c due to ARF. Please decide when to continue this medication if appropriate in the future. 5. Continued metformin on discharge as patient's creatinine below 1.5, but should patient's po intake decrease then would hold metformin and recheck serum creatinine levels.  If more than 1.5 would hold metformin. 6. Check serum creatinine within the next 3-7 days  Discharge Diagnoses:  Active Problems:   DIABETES MELLITUS, TYPE II, ON INSULIN   COPD   DKA, type 2   HTN (hypertension)   CAD (coronary artery disease)   Encephalopathy acute   Healthcare-associated pneumonia   ARF (acute renal failure)   Hyperkalemia   HCAP (healthcare-associated pneumonia)   SIRS (systemic inflammatory response syndrome)   Protein-calorie malnutrition, severe   Discharge Condition: Stable  Diet recommendation: Carb modified diet  Filed Weights   02/23/13 1429 02/25/13 0400 02/26/13 0200  Weight: 72.576 kg (160 lb) 64.5 kg (142 lb 3.2 oz) 64.1 kg (141 lb 5 oz)    History of present illness:  Pt is a 80 y/o CM with h/o DM, seizures, HTN, who resides at SNF who presented to the hospital with AMS.  Was found to have DKA and bibasilar consolidation initially.  Hospital Course:  DKA, type 2/DIABETES MELLITUS, TYPE II, ON INSULIN  - Will discharge on Levemir and metformin.  But would check serum creatinine within the next week. - Blood sugars much improved. Suspect elevated blood sugars were secondary to active infection - Continued  metformin on discharge as patient's creatinine below 1.5, but should patient's po intake decrease then would hold metformin and recheck serum creatinine levels.  If more than 1.5 would hold metformin.   COPD  - Appears compensated, no wheezes on exam.   HTN (hypertension)  -Patient currently on metoprolol with relatively well controlled blood pressures.  -Continue to monitor  - d/c lisinopril on d/c due to ARF  CAD (coronary artery disease)  - Stable continue Plavix   Encephalopathy acute  - Most likely secondary to principal problem list above. - Pt responding to questions appropriately and alert.  - resolved.  Healthcare-associated pneumonia  - No sputum culture available.  Patient has history of seizure disorder as such will not discharge on Levaquin as this may decrease seizure threshold.  Will discharge on 3rd generation cephalosporin with a more prolonged course. As such patient will be prescribed 7 more days of oral antibiotics to complete a total of 10 days total antibiotic regimen.  ARF (acute renal failure)  - Most likely secondary to principal problem and loss of fluids. Improved with IV fluid rehydration, improved oral intake, and resolution of DKA.  - serum creatinine continued to trend down and last value 1.4 - Will discontinue lisinopril on discharge. (avoiding nephrotoxic agent)  Hyperkalemia  - Resolved   Procedures:  Was initially on insulin gtt  Consultations:  Diabetic coordinator  Discharge Exam: Filed Vitals:   02/26/13 0600  BP:   Pulse: 66  Temp:   Resp: 12    General: Pt in NAD, Alert and Awake Cardiovascular: RRR, no  MRG Respiratory: CTA BL, no wheezes  Discharge Instructions  Discharge Orders   Future Orders Complete By Expires   Call MD for:  extreme fatigue  As directed    Call MD for:  persistant nausea and vomiting  As directed    Call MD for:  temperature >100.4  As directed    Diet - low sodium heart healthy  As directed     Increase activity slowly  As directed        Medication List         acetaminophen 325 MG tablet  Commonly known as:  TYLENOL  Take 650 mg by mouth at bedtime.     AMARYL 4 MG tablet  Generic drug:  glimepiride  Take 8 mg by mouth daily with breakfast.     cefdinir 300 MG capsule  Commonly known as:  OMNICEF  Take 1 capsule (300 mg total) by mouth 2 (two) times daily.     cholecalciferol 1000 UNITS tablet  Commonly known as:  VITAMIN D  Take 1,000 Units by mouth every morning.     clopidogrel 75 MG tablet  Commonly known as:  PLAVIX  Take 75 mg by mouth daily with breakfast.     divalproex 250 MG DR tablet  Commonly known as:  DEPAKOTE  Take 375 mg by mouth at bedtime.     donepezil 10 MG tablet  Commonly known as:  ARICEPT  Take 10 mg by mouth at bedtime.     escitalopram 20 MG tablet  Commonly known as:  LEXAPRO  Take 20 mg by mouth every morning.     famotidine 20 MG tablet  Commonly known as:  PEPCID  Take 20 mg by mouth 2 (two) times daily.     glucagon 1 MG Solr injection  Commonly known as:  GLUCAGEN  Inject 1 mg into the vein once as needed for low blood sugar.     hydrOXYzine 25 MG capsule  Commonly known as:  VISTARIL  Take 25 mg by mouth 3 (three) times daily.     insulin detemir 100 UNIT/ML injection  Commonly known as:  LEVEMIR  Inject 0.15 mLs (15 Units total) into the skin daily.     levETIRAcetam 500 MG tablet  Commonly known as:  KEPPRA  Take 500 mg by mouth every morning.              LORazepam 0.5 MG tablet  Commonly known as:  ATIVAN  Take 1 tablet (0.5 mg total) by mouth 2 (two) times daily.     metFORMIN 1000 MG tablet  Commonly known as:  GLUCOPHAGE  Take 1,000 mg by mouth 2 (two) times daily with a meal.     metoprolol succinate 50 MG 24 hr tablet  Commonly known as:  TOPROL-XL  Take 50 mg by mouth every morning. Take with or immediately following a meal.     montelukast 10 MG tablet  Commonly known as:  SINGULAIR   Take 10 mg by mouth every morning.     multivitamin with minerals Tabs tablet  Take 1 tablet by mouth every morning.     pantoprazole 20 MG tablet  Commonly known as:  PROTONIX  Take 20 mg by mouth daily.       No Known Allergies    The results of significant diagnostics from this hospitalization (including imaging, microbiology, ancillary and laboratory) are listed below for reference.    Significant Diagnostic Studies: Dg Chest 2 View  02/23/2013  CLINICAL DATA:  Cough  EXAM: CHEST  2 VIEW  COMPARISON:  02/22/2011  FINDINGS: Normal heart size. Consolidation at the lung bases. No pneumothorax. Thorax is rotated to the right.  IMPRESSION: Bibasilar consolidation.   Electronically Signed   By: Maryclare Bean M.D.   On: 02/23/2013 13:29    Microbiology: Recent Results (from the past 240 hour(s))  CULTURE, BLOOD (ROUTINE X 2)     Status: None   Collection Time    02/23/13  6:50 PM      Result Value Range Status   Specimen Description BLOOD RIGHT ARM   Final   Special Requests BOTTLES DRAWN AEROBIC AND ANAEROBIC 5CC EACH   Final   Culture  Setup Time     Final   Value: 02/24/2013 05:37     Performed at [facility]   Culture     Final   Value:        BLOOD CULTURE RECEIVED NO GROWTH TO DATE CULTURE WILL BE HELD FOR 5 DAYS BEFORE ISSUING A FINAL NEGATIVE REPORT     Performed at [facility]   Report Status PENDING   Incomplete  MRSA PCR SCREENING     Status: Abnormal   Collection Time    02/23/13  6:54 PM      Result Value Range Status   MRSA by PCR POSITIVE (*) NEGATIVE Final   Comment:            The GeneXpert MRSA Assay (FDA     approved for NASAL specimens     only), is one component of a     comprehensive MRSA colonization     surveillance program. It is not     intended to diagnose MRSA     infection nor to guide or     monitor treatment for     MRSA infections.     RESULT CALLED TO, READ BACK BY AND VERIFIED WITH:     MICHELLE REEVES,RN [id number] @  2016 BY J SCOTTON  CULTURE, BLOOD (ROUTINE X 2)     Status: None   Collection Time    02/23/13  6:55 PM      Result Value Range Status   Specimen Description BLOOD RIGHT HAND   Final   Special Requests BOTTLES DRAWN AEROBIC ONLY 4CC   Final   Culture  Setup Time     Final   Value: 02/24/2013 05:37     Performed at [facility]   Culture     Final   Value:        BLOOD CULTURE RECEIVED NO GROWTH TO DATE CULTURE WILL BE HELD FOR 5 DAYS BEFORE ISSUING A FINAL NEGATIVE REPORT     Performed at [facility]   Report Status PENDING   Incomplete     Labs: Basic Metabolic Panel:  Recent Labs Lab 02/23/13 2240 02/24/13 0217 02/24/13 0732 02/24/13 1100 02/26/13 0325  NA 140 141 141 143 139  K 4.9 4.8 4.4 4.3 4.2  CL 108 108 109 110 105  CO2 15* 15* 17* 19 20  GLUCOSE 182* 182* 183* 182* 130*  BUN 33* 33* 29* 27* 18  CREATININE 1.82* 1.73* 1.60* 1.48* 1.41*  CALCIUM 7.8* 8.0* 8.0* 8.3* 8.1*   Liver Function Tests:  Recent Labs Lab 02/23/13 1300  AST 13  ALT 13  ALKPHOS 69  BILITOT 0.6  PROT 6.8  ALBUMIN 3.2*   No results found for this basename: LIPASE, AMYLASE,  in  the last 168 hours No results found for this basename: AMMONIA,  in the last 168 hours CBC:  Recent Labs Lab 02/23/13 1300 02/24/13 0217 02/26/13 0325  WBC 12.1* 20.1* 10.5  NEUTROABS 11.0*  --   --   HGB 12.8* 10.3* 9.2*  HCT 37.4* 30.3* 27.2*  MCV 85.6 85.4 85.0  PLT 287 270 237   Cardiac Enzymes: No results found for this basename: CKTOTAL, CKMB, CKMBINDEX, TROPONINI,  in the last 168 hours BNP: BNP (last 3 results) No results found for this basename: PROBNP,  in the last 8760 hours CBG:  Recent Labs Lab 02/25/13 0804 02/25/13 1225 02/25/13 1654 02/25/13 2136 02/26/13 0810  GLUCAP 187* 208* 174* 215* 176*       Signed:  Penny Pia  [facility] 02/26/2013, 9:13 AM

## 2013-02-26 NOTE — Progress Notes (Signed)
Discharged to Nursing home.  Report called to [facility] RN

## 2013-03-02 LAB — CULTURE, BLOOD (ROUTINE X 2)
CULTURE: NO GROWTH
CULTURE: NO GROWTH

## 2013-03-21 NOTE — H&P (Signed)
Original H&P of 1/24 mislabeled he progress note. Kela MillinAdeline C Jaycub Noorani, MD Physician Signed Internal Medicine Progress Notes Service date: 02/23/2013 6:20 PM  [facility] History and Physical   [name] [name] Walker ZOX:[id number] DOB: 02/18/1931 DOA: 02/23/2013   Referring physician: EDP PCP: Minda MeoARONSON,RICHARD A, MD    Chief Complaint: AMS   HPI: [name] Walker is a 80 y.o. male SNF resident withpast medical history significant for diabetes mellitus, seizures hypertension and as listed below who presents from nursing facility with altered mental status. It is reported that earlier today he was found to have altered mental status and his blood glucose was 47. He was given glucagon and following that his blood sugars stayed elevated. Pt is unable to give history and it is obtained from EDP in chart review. In the ED he was found to be febrile to 103, and labs revealed a glucose of 347 with CO2 of 15 anion gap of 21, k 5.7 and creatinine of 1.9. His last creatinine and 2013 was 1.05. Patient also had a chest x-ray done which revealed by basilar consolidation, and he was found to have a white count of 12, and lactic acid within normal limits at 1.92. He was started on empiric antibiotics in the ED and is admitted for further evaluation and management. Patient admits to coughing all day, but otherwise does not answer any other questions.     Review of Systems As per history of present illness, otherwise unobtainable.      Past Medical History   Diagnosis  Date   .  Coronary artery disease     .  Diabetes mellitus     .  Hypertension     .  Hyperlipidemia     .  Headache(784.0)     .  Seizure     .  Arthritis     .  Anemia     .  Blind left eye     .  Subdural hematoma         hx recurrent right subdural hematoma    Past Surgical History   Procedure  Laterality  Date   .  Appendectomy           AGE 54   .  Rhino-septoplasty    1976   .  Knee arthroscopy    1981       left knee    .  Retinal detachment surgery    1989       left   .  Cataract extraction    1995       right eye   .  Knee arthroscopy    1998       right knee   .  Heart catherization    2001   .  Knee surgery    2003       revision of left knee   .  Penile prosthesis implant    2005   .  Shoulder arthroscopy    2005, 2006       right shoulder   .  Knee surgery    1998       left knee lateral release   .  Replacement total knee bilateral        Social History: reports that he quit smoking about 47 years ago. He has never used smokeless tobacco. He reports that he does not drink alcohol or use illicit drugs.   No Known Allergies   No family history on file.  Prior to Admission medications    Medication  Sig  Start Date  End Date  Taking?  Authorizing Provider   acetaminophen (TYLENOL) 325 MG tablet  Take 650 mg by mouth at bedtime.      Yes  Historical Provider, MD   cholecalciferol (VITAMIN D) 1000 UNITS tablet  Take 1,000 Units by mouth every morning.       Yes  Historical Provider, MD   clopidogrel (PLAVIX) 75 MG tablet  Take 75 mg by mouth daily with breakfast.      Yes  Historical Provider, MD   divalproex (DEPAKOTE) 250 MG DR tablet  Take 375 mg by mouth at bedtime.      Yes  Historical Provider, MD   donepezil (ARICEPT) 10 MG tablet  Take 10 mg by mouth at bedtime.      Yes  Historical Provider, MD   escitalopram (LEXAPRO) 20 MG tablet  Take 20 mg by mouth every morning.      Yes  Historical Provider, MD   famotidine (PEPCID) 20 MG tablet  Take 20 mg by mouth 2 (two) times daily.      Yes  Historical Provider, MD   glimepiride (AMARYL) 4 MG tablet  Take 8 mg by mouth daily with breakfast.      Yes  Historical Provider, MD   glucagon (GLUCAGEN) 1 MG SOLR injection  Inject 1 mg into the vein once as needed for low blood sugar.      Yes  Historical Provider, MD   hydrOXYzine (VISTARIL) 25 MG capsule  Take 25 mg by mouth 3 (three) times daily.      Yes  Historical Provider, MD   insulin  detemir (LEVEMIR) 100 UNIT/ML injection  Inject 15 Units into the skin 2 (two) times daily.       Yes  Historical Provider, MD   levETIRAcetam (KEPPRA) 500 MG tablet  Take 500 mg by mouth every morning.       Yes  Historical Provider, MD   lisinopril (PRINIVIL,ZESTRIL) 5 MG tablet  Take 5 mg by mouth every morning.       Yes  Historical Provider, MD   LORazepam (ATIVAN) 0.5 MG tablet  Take 0.5 mg by mouth 2 (two) times daily.       Yes  Historical Provider, MD   metFORMIN (GLUCOPHAGE) 1000 MG tablet  Take 1,000 mg by mouth 2 (two) times daily with a meal.      Yes  Historical Provider, MD   metoprolol succinate (TOPROL-XL) 50 MG 24 hr tablet  Take 50 mg by mouth every morning. Take with or immediately following a meal.      Yes  Historical Provider, MD   montelukast (SINGULAIR) 10 MG tablet  Take 10 mg by mouth every morning.      Yes  Historical Provider, MD   Multiple Vitamin (MULTIVITAMIN WITH MINERALS) TABS tablet  Take 1 tablet by mouth every morning.      Yes  Historical Provider, MD   pantoprazole (PROTONIX) 20 MG tablet  Take 20 mg by mouth daily.      Yes  Historical Provider, MD      Physical Exam: Filed Vitals:     02/23/13 1400   BP:  127/71   Pulse:     Temp:     Resp:  29      BP 127/71  Pulse 118  Temp(Src) 103 F (39.4 C) (Rectal)  Resp 29  Ht 5\' 9"  (1.753 m)  Wt 72.576 kg (160 lb)  BMI 23.62 kg/m2  SpO2 91% Constitutional: Vital signs reviewed.  Patient is a well-developed and well-nourished  in no acute distress and cooperative with exam. Alert but does not answer to any questions regarding orientation, although selectively answers some questions. Head: Normocephalic and atraumatic Ear: TM normal bilaterally Nose: No erythema or drainage noted.  Turbinates normal Mouth: no erythema or exudates, MMM Eyes: PERRL, EOMI, conjunctivae normal, No scleral icterus.  Neck: Supple, Trachea midline normal ROM, No JVD, mass, thyromegaly, or carotid bruit present.   Cardiovascular: RRR, S1 normal, S2 normal, no MRG, pulses symmetric and intact bilaterally Pulmonary/Chest: normal respiratory effort, CTAB, no wheezes, rales, or rhonchi Abdominal: Soft. Non-tender, non-distended, bowel sounds are normal, no masses, organomegaly, or guarding present.   GU: no CVA tenderness Musculoskeletal: No joint deformities, erythema, or stiffness, ROM full and no nontender Hematology: no cervical, inginal, or axillary adenopathy.  Neurological:   cranial nerve II-XII are grossly intact, no focal motor deficit, sensory grossly intact, moves all extremities Skin: Warm, dry and intact. No rash, cyanosis, or clubbing.  Psychiatric: Flat affect.                           Labs on Admission:  Basic Metabolic Panel: Recent Labs Lab  02/23/13 1300   NA  139   K  5.7*   CL  103   CO2  15*   GLUCOSE  347*   BUN  34*   CREATININE  1.91*   CALCIUM  8.4    Liver Function Tests: Recent Labs Lab  02/23/13 1300   AST  13   ALT  13   ALKPHOS  69   BILITOT  0.6   PROT  6.8   ALBUMIN  3.2*    No results found for this basename: LIPASE, AMYLASE,  in the last 168 hours No results found for this basename: AMMONIA,  in the last 168 hours CBC: Recent Labs Lab  02/23/13 1300   WBC  12.1*   NEUTROABS  11.0*   HGB  12.8*   HCT  37.4*   MCV  85.6   PLT  287    Cardiac Enzymes: No results found for this basename: CKTOTAL, CKMB, CKMBINDEX, TROPONINI,  in the last 168 hours   BNP (last 3 results) No results found for this basename: PROBNP,  in the last 8760 hours CBG: Recent Labs Lab  02/23/13 1308  02/23/13 1737   GLUCAP  306*  342*      Radiological Exams on Admission: Dg Chest 2 View   02/23/2013   CLINICAL DATA:  Cough  EXAM: CHEST  2 VIEW  COMPARISON:  02/22/2011  FINDINGS: Normal heart size. Consolidation at the lung bases. No pneumothorax. Thorax is rotated to the right.  IMPRESSION: Bibasilar consolidation.   Electronically Signed   By: Maryclare Bean M.D.   On: 02/23/2013 13:29       Assessment/Plan Active Problems: HCAP (healthcare-associated pneumonia) -As discussed above, patient presenting with cough fevers 103 and x-rays with consolidation at lung bases bilaterally -Will obtain blood cultures and place on empiric antibiotics with bank and cefepime -Will also obtain influenza PCR and start on empiric Tamiflu pending results SIRS -Secondary to above, obtain cultures and studies as above -empiric antibiotics as above, follow.   DKA, type 2 -Likely precipitated by infection. -Although it is  noted as discussed above that he was hypoglycemic earlier this a.m.  he presents with hyperglycemia CO2 of 15 with an anion gap of 21 -Will place on IV insulin via glucose stabilizer, monitor Accu-Cheks closely as per protocol -Bmet Q4hrs, follow and transition to subcutaneous insulin only after anion gap closes and BGcontrolled as per glucose stabilizer protocol DIABETES MELLITUS, TYPE II, ON INSULIN -Management as discussed above ARF (acute renal failure)  -Likely due to dehydration in setting of ACE inhibitor and above. -Hold ACE inhibitor, hydrate follow recheck Encephalopathy acute -Unclear baseline as he seems to have history of dementia(on Aricept) Hyperkalemia -Recheck k on an insulin drip, and further treat accordingly   COPD -Stable, when necessary bronchodilators and follow.   HTN (hypertension) -Hold lisinopril as above monitor and further treat accordingly. Continue metoprolol   CAD (coronary artery disease)   -Chest pain free, continue outpatient medications the                Code Status: full Family Communication: none at bedside Disposition Plan: Admit to step down   Time spent:     [facility] Pager [phone]

## 2013-11-19 ENCOUNTER — Encounter (HOSPITAL_COMMUNITY): Payer: Self-pay | Admitting: Emergency Medicine

## 2013-11-19 ENCOUNTER — Inpatient Hospital Stay (HOSPITAL_COMMUNITY)
Admission: EM | Admit: 2013-11-19 | Discharge: 2013-11-25 | DRG: 871 | Disposition: A | Discharge status: Skilled Nursing Facility | Payer: Medicare Other | Attending: Internal Medicine | Admitting: Internal Medicine

## 2013-11-19 ENCOUNTER — Emergency Department (HOSPITAL_COMMUNITY): Payer: Medicare Other

## 2013-11-19 DIAGNOSIS — N178 Other acute kidney failure: Secondary | ICD-10-CM

## 2013-11-19 DIAGNOSIS — Z66 Do not resuscitate: Secondary | ICD-10-CM | POA: Diagnosis present

## 2013-11-19 DIAGNOSIS — M1990 Unspecified osteoarthritis, unspecified site: Secondary | ICD-10-CM | POA: Diagnosis present

## 2013-11-19 DIAGNOSIS — A419 Sepsis, unspecified organism: Principal | ICD-10-CM

## 2013-11-19 DIAGNOSIS — N179 Acute kidney failure, unspecified: Secondary | ICD-10-CM

## 2013-11-19 DIAGNOSIS — E870 Hyperosmolality and hypernatremia: Secondary | ICD-10-CM | POA: Diagnosis present

## 2013-11-19 DIAGNOSIS — F329 Major depressive disorder, single episode, unspecified: Secondary | ICD-10-CM | POA: Diagnosis present

## 2013-11-19 DIAGNOSIS — E876 Hypokalemia: Secondary | ICD-10-CM

## 2013-11-19 DIAGNOSIS — E11649 Type 2 diabetes mellitus with hypoglycemia without coma: Secondary | ICD-10-CM

## 2013-11-19 DIAGNOSIS — E872 Acidosis, unspecified: Secondary | ICD-10-CM

## 2013-11-19 DIAGNOSIS — R1310 Dysphagia, unspecified: Secondary | ICD-10-CM | POA: Diagnosis present

## 2013-11-19 DIAGNOSIS — N390 Urinary tract infection, site not specified: Secondary | ICD-10-CM | POA: Diagnosis present

## 2013-11-19 DIAGNOSIS — D649 Anemia, unspecified: Secondary | ICD-10-CM | POA: Diagnosis present

## 2013-11-19 DIAGNOSIS — Z794 Long term (current) use of insulin: Secondary | ICD-10-CM | POA: Diagnosis not present

## 2013-11-19 DIAGNOSIS — F0391 Unspecified dementia with behavioral disturbance: Secondary | ICD-10-CM | POA: Diagnosis present

## 2013-11-19 DIAGNOSIS — E1110 Type 2 diabetes mellitus with ketoacidosis without coma: Secondary | ICD-10-CM

## 2013-11-19 DIAGNOSIS — F0390 Unspecified dementia without behavioral disturbance: Secondary | ICD-10-CM | POA: Diagnosis present

## 2013-11-19 DIAGNOSIS — J9600 Acute respiratory failure, unspecified whether with hypoxia or hypercapnia: Secondary | ICD-10-CM | POA: Diagnosis present

## 2013-11-19 DIAGNOSIS — E1310 Other specified diabetes mellitus with ketoacidosis without coma: Secondary | ICD-10-CM | POA: Diagnosis present

## 2013-11-19 DIAGNOSIS — I129 Hypertensive chronic kidney disease with stage 1 through stage 4 chronic kidney disease, or unspecified chronic kidney disease: Secondary | ICD-10-CM | POA: Diagnosis present

## 2013-11-19 DIAGNOSIS — I2510 Atherosclerotic heart disease of native coronary artery without angina pectoris: Secondary | ICD-10-CM | POA: Diagnosis present

## 2013-11-19 DIAGNOSIS — G92 Toxic encephalopathy: Secondary | ICD-10-CM | POA: Diagnosis present

## 2013-11-19 DIAGNOSIS — Z9841 Cataract extraction status, right eye: Secondary | ICD-10-CM | POA: Diagnosis not present

## 2013-11-19 DIAGNOSIS — F919 Conduct disorder, unspecified: Secondary | ICD-10-CM | POA: Diagnosis present

## 2013-11-19 DIAGNOSIS — J449 Chronic obstructive pulmonary disease, unspecified: Secondary | ICD-10-CM

## 2013-11-19 DIAGNOSIS — Z8249 Family history of ischemic heart disease and other diseases of the circulatory system: Secondary | ICD-10-CM

## 2013-11-19 DIAGNOSIS — G9340 Encephalopathy, unspecified: Secondary | ICD-10-CM

## 2013-11-19 DIAGNOSIS — I252 Old myocardial infarction: Secondary | ICD-10-CM

## 2013-11-19 DIAGNOSIS — E785 Hyperlipidemia, unspecified: Secondary | ICD-10-CM | POA: Diagnosis present

## 2013-11-19 DIAGNOSIS — J189 Pneumonia, unspecified organism: Secondary | ICD-10-CM | POA: Diagnosis present

## 2013-11-19 DIAGNOSIS — I10 Essential (primary) hypertension: Secondary | ICD-10-CM

## 2013-11-19 DIAGNOSIS — R627 Adult failure to thrive: Secondary | ICD-10-CM | POA: Diagnosis present

## 2013-11-19 DIAGNOSIS — Z79899 Other long term (current) drug therapy: Secondary | ICD-10-CM

## 2013-11-19 DIAGNOSIS — R509 Fever, unspecified: Secondary | ICD-10-CM | POA: Diagnosis present

## 2013-11-19 DIAGNOSIS — R6520 Severe sepsis without septic shock: Secondary | ICD-10-CM | POA: Diagnosis present

## 2013-11-19 DIAGNOSIS — I4891 Unspecified atrial fibrillation: Secondary | ICD-10-CM | POA: Diagnosis present

## 2013-11-19 DIAGNOSIS — K449 Diaphragmatic hernia without obstruction or gangrene: Secondary | ICD-10-CM | POA: Diagnosis present

## 2013-11-19 DIAGNOSIS — H5442 Blindness, left eye, normal vision right eye: Secondary | ICD-10-CM | POA: Diagnosis present

## 2013-11-19 DIAGNOSIS — I248 Other forms of acute ischemic heart disease: Secondary | ICD-10-CM | POA: Diagnosis present

## 2013-11-19 DIAGNOSIS — Z87891 Personal history of nicotine dependence: Secondary | ICD-10-CM | POA: Diagnosis not present

## 2013-11-19 DIAGNOSIS — N189 Chronic kidney disease, unspecified: Secondary | ICD-10-CM | POA: Diagnosis present

## 2013-11-19 DIAGNOSIS — E43 Unspecified severe protein-calorie malnutrition: Secondary | ICD-10-CM

## 2013-11-19 DIAGNOSIS — E0810 Diabetes mellitus due to underlying condition with ketoacidosis without coma: Secondary | ICD-10-CM

## 2013-11-19 LAB — GLUCOSE, CAPILLARY
GLUCOSE-CAPILLARY: 74 mg/dL (ref 70–99)
GLUCOSE-CAPILLARY: 82 mg/dL (ref 70–99)
GLUCOSE-CAPILLARY: 77 mg/dL (ref 70–99)
GLUCOSE-CAPILLARY: 82 mg/dL (ref 70–99)
Glucose-Capillary: 163 mg/dL — ABNORMAL HIGH (ref 70–99)
Glucose-Capillary: 117 mg/dL — ABNORMAL HIGH (ref 70–99)
Glucose-Capillary: 70 mg/dL (ref 70–99)
Glucose-Capillary: 110 mg/dL — ABNORMAL HIGH (ref 70–99)
Glucose-Capillary: 63 mg/dL — ABNORMAL LOW (ref 70–99)
Glucose-Capillary: 87 mg/dL (ref 70–99)
Glucose-Capillary: 100 mg/dL — ABNORMAL HIGH (ref 70–99)
Glucose-Capillary: 90 mg/dL (ref 70–99)
Glucose-Capillary: 85 mg/dL (ref 70–99)
Glucose-Capillary: 79 mg/dL (ref 70–99)

## 2013-11-19 LAB — CBG MONITORING, ED
GLUCOSE-CAPILLARY: 250 mg/dL — AB (ref 70–99)
Glucose-Capillary: 525 mg/dL — ABNORMAL HIGH (ref 70–99)
Glucose-Capillary: 420 mg/dL — ABNORMAL HIGH (ref 70–99)
Glucose-Capillary: 323 mg/dL — ABNORMAL HIGH (ref 70–99)

## 2013-11-19 LAB — BASIC METABOLIC PANEL
ANION GAP: 17 — AB (ref 5–15)
ANION GAP: 18 — AB (ref 5–15)
Anion gap: 23 — ABNORMAL HIGH (ref 5–15)
Anion gap: 17 — ABNORMAL HIGH (ref 5–15)
Anion gap: 17 — ABNORMAL HIGH (ref 5–15)
Anion gap: 17 — ABNORMAL HIGH (ref 5–15)
Anion gap: 19 — ABNORMAL HIGH (ref 5–15)
Anion gap: 17 — ABNORMAL HIGH (ref 5–15)
BUN: 74 mg/dL — ABNORMAL HIGH (ref 6–23)
BUN: 60 mg/dL — ABNORMAL HIGH (ref 6–23)
BUN: 69 mg/dL — ABNORMAL HIGH (ref 6–23)
BUN: 70 mg/dL — ABNORMAL HIGH (ref 6–23)
BUN: 73 mg/dL — ABNORMAL HIGH (ref 6–23)
BUN: 72 mg/dL — ABNORMAL HIGH (ref 6–23)
BUN: 72 mg/dL — ABNORMAL HIGH (ref 6–23)
BUN: 71 mg/dL — ABNORMAL HIGH (ref 6–23)
CALCIUM: 7.5 mg/dL — AB (ref 8.4–10.5)
CALCIUM: 7.7 mg/dL — AB (ref 8.4–10.5)
CHLORIDE: 122 meq/L — AB (ref 96–112)
CO2: 11 mEq/L — ABNORMAL LOW (ref 19–32)
CO2: 10 mEq/L — CL (ref 19–32)
CO2: 14 mEq/L — ABNORMAL LOW (ref 19–32)
CO2: 15 mEq/L — ABNORMAL LOW (ref 19–32)
CO2: 14 mEq/L — ABNORMAL LOW (ref 19–32)
CO2: 14 mEq/L — ABNORMAL LOW (ref 19–32)
CO2: 14 mEq/L — ABNORMAL LOW (ref 19–32)
CO2: 14 mEq/L — ABNORMAL LOW (ref 19–32)
CREATININE: 4.6 mg/dL — AB (ref 0.50–1.35)
Calcium: 8.2 mg/dL — ABNORMAL LOW (ref 8.4–10.5)
Calcium: 6.2 mg/dL — CL (ref 8.4–10.5)
Calcium: 7.4 mg/dL — ABNORMAL LOW (ref 8.4–10.5)
Calcium: 7.9 mg/dL — ABNORMAL LOW (ref 8.4–10.5)
Calcium: 7.8 mg/dL — ABNORMAL LOW (ref 8.4–10.5)
Calcium: 7.8 mg/dL — ABNORMAL LOW (ref 8.4–10.5)
Chloride: 115 mEq/L — ABNORMAL HIGH (ref 96–112)
Chloride: 119 mEq/L — ABNORMAL HIGH (ref 96–112)
Chloride: 121 mEq/L — ABNORMAL HIGH (ref 96–112)
Chloride: 121 mEq/L — ABNORMAL HIGH (ref 96–112)
Chloride: 119 mEq/L — ABNORMAL HIGH (ref 96–112)
Chloride: 117 mEq/L — ABNORMAL HIGH (ref 96–112)
Chloride: 120 mEq/L — ABNORMAL HIGH (ref 96–112)
Creatinine, Ser: 4.24 mg/dL — ABNORMAL HIGH (ref 0.50–1.35)
Creatinine, Ser: 3.41 mg/dL — ABNORMAL HIGH (ref 0.50–1.35)
Creatinine, Ser: 4.37 mg/dL — ABNORMAL HIGH (ref 0.50–1.35)
Creatinine, Ser: 4.45 mg/dL — ABNORMAL HIGH (ref 0.50–1.35)
Creatinine, Ser: 4.59 mg/dL — ABNORMAL HIGH (ref 0.50–1.35)
Creatinine, Ser: 4.6 mg/dL — ABNORMAL HIGH (ref 0.50–1.35)
Creatinine, Ser: 4.77 mg/dL — ABNORMAL HIGH (ref 0.50–1.35)
GFR calc Af Amer: 13 mL/min — ABNORMAL LOW (ref >90)
GFR calc Af Amer: 13 mL/min — ABNORMAL LOW (ref >90)
GFR calc Af Amer: 13 mL/min — ABNORMAL LOW (ref >90)
GFR calc Af Amer: 13 mL/min — ABNORMAL LOW (ref >90)
GFR calc Af Amer: 12 mL/min — ABNORMAL LOW (ref >90)
GFR calc non Af Amer: 11 mL/min — ABNORMAL LOW (ref >90)
GFR calc non Af Amer: 11 mL/min — ABNORMAL LOW (ref >90)
GFR calc non Af Amer: 10 mL/min — ABNORMAL LOW (ref >90)
GFR, EST AFRICAN AMERICAN: 14 mL/min — AB (ref >90)
GFR, EST AFRICAN AMERICAN: 18 mL/min — AB (ref >90)
GFR, EST AFRICAN AMERICAN: 13 mL/min — AB (ref >90)
GFR, EST NON AFRICAN AMERICAN: 12 mL/min — AB (ref >90)
GFR, EST NON AFRICAN AMERICAN: 16 mL/min — AB (ref >90)
GFR, EST NON AFRICAN AMERICAN: 12 mL/min — AB (ref >90)
GFR, EST NON AFRICAN AMERICAN: 11 mL/min — AB (ref >90)
GFR, EST NON AFRICAN AMERICAN: 11 mL/min — AB (ref >90)
GLUCOSE: 284 mg/dL — AB (ref 70–99)
Glucose, Bld: 418 mg/dL — ABNORMAL HIGH (ref 70–99)
Glucose, Bld: 160 mg/dL — ABNORMAL HIGH (ref 70–99)
Glucose, Bld: 105 mg/dL — ABNORMAL HIGH (ref 70–99)
Glucose, Bld: 69 mg/dL — ABNORMAL LOW (ref 70–99)
Glucose, Bld: 99 mg/dL (ref 70–99)
Glucose, Bld: 89 mg/dL (ref 70–99)
Glucose, Bld: 112 mg/dL — ABNORMAL HIGH (ref 70–99)
POTASSIUM: 4.4 meq/L (ref 3.7–5.3)
POTASSIUM: 3.3 meq/L — AB (ref 3.7–5.3)
Potassium: 3.6 mEq/L — ABNORMAL LOW (ref 3.7–5.3)
Potassium: 3.9 mEq/L (ref 3.7–5.3)
Potassium: 3.7 mEq/L (ref 3.7–5.3)
Potassium: 4 mEq/L (ref 3.7–5.3)
Potassium: 4.3 mEq/L (ref 3.7–5.3)
Potassium: 4.5 mEq/L (ref 3.7–5.3)
SODIUM: 149 meq/L — AB (ref 137–147)
SODIUM: 150 meq/L — AB (ref 137–147)
SODIUM: 153 meq/L — AB (ref 137–147)
SODIUM: 152 meq/L — AB (ref 137–147)
SODIUM: 151 meq/L — AB (ref 137–147)
Sodium: 149 mEq/L — ABNORMAL HIGH (ref 137–147)
Sodium: 150 mEq/L — ABNORMAL HIGH (ref 137–147)
Sodium: 151 mEq/L — ABNORMAL HIGH (ref 137–147)

## 2013-11-19 LAB — CBC WITH DIFFERENTIAL/PLATELET
BASOS PCT: 0 % (ref 0–1)
Basophils Absolute: 0 10*3/uL (ref 0.0–0.1)
Basophils Absolute: 0 10*3/uL (ref 0.0–0.1)
Basophils Relative: 0 % (ref 0–1)
EOS ABS: 0 10*3/uL (ref 0.0–0.7)
Eosinophils Absolute: 0 10*3/uL (ref 0.0–0.7)
Eosinophils Relative: 0 % (ref 0–5)
Eosinophils Relative: 0 % (ref 0–5)
HCT: 23.5 % — ABNORMAL LOW (ref 39.0–52.0)
HEMATOCRIT: 34 % — AB (ref 39.0–52.0)
Hemoglobin: 11.5 g/dL — ABNORMAL LOW (ref 13.0–17.0)
Hemoglobin: 8 g/dL — ABNORMAL LOW (ref 13.0–17.0)
LYMPHS PCT: 4 % — AB (ref 12–46)
LYMPHS PCT: 7 % — AB (ref 12–46)
Lymphs Abs: 0.7 10*3/uL (ref 0.7–4.0)
Lymphs Abs: 0.9 10*3/uL (ref 0.7–4.0)
MCH: 30.1 pg (ref 26.0–34.0)
MCH: 30.5 pg (ref 26.0–34.0)
MCHC: 33.8 g/dL (ref 30.0–36.0)
MCHC: 34 g/dL (ref 30.0–36.0)
MCV: 89 fL (ref 78.0–100.0)
MCV: 89.7 fL (ref 78.0–100.0)
MONOS PCT: 6 % (ref 3–12)
Monocytes Absolute: 1.1 10*3/uL — ABNORMAL HIGH (ref 0.1–1.0)
Monocytes Absolute: 0.5 10*3/uL (ref 0.1–1.0)
Monocytes Relative: 4 % (ref 3–12)
NEUTROS ABS: 16.5 10*3/uL — AB (ref 1.7–7.7)
NEUTROS PCT: 89 % — AB (ref 43–77)
Neutro Abs: 11.8 10*3/uL — ABNORMAL HIGH (ref 1.7–7.7)
Neutrophils Relative %: 90 % — ABNORMAL HIGH (ref 43–77)
Platelets: 313 10*3/uL (ref 150–400)
Platelets: ADEQUATE 10*3/uL (ref 150–400)
RBC: 3.82 MIL/uL — ABNORMAL LOW (ref 4.22–5.81)
RBC: 2.62 MIL/uL — ABNORMAL LOW (ref 4.22–5.81)
RDW: 14.1 % (ref 11.5–15.5)
RDW: 14.1 % (ref 11.5–15.5)
WBC: 18.3 10*3/uL — AB (ref 4.0–10.5)
WBC: 13.2 10*3/uL — AB (ref 4.0–10.5)

## 2013-11-19 LAB — URINALYSIS, ROUTINE W REFLEX MICROSCOPIC
BILIRUBIN URINE: NEGATIVE
Glucose, UA: >1000 mg/dL — AB
KETONES UR: NEGATIVE mg/dL
Leukocytes, UA: NEGATIVE
NITRITE: NEGATIVE
Protein, ur: 30 mg/dL — AB
Specific Gravity, Urine: 1.026 (ref 1.005–1.030)
UROBILINOGEN UA: 0.2 mg/dL (ref 0.0–1.0)
pH: 5 (ref 5.0–8.0)

## 2013-11-19 LAB — I-STAT CHEM 8, ED
BUN: 69 mg/dL — AB (ref 6–23)
CHLORIDE: 115 meq/L — AB (ref 96–112)
CREATININE: 4.7 mg/dL — AB (ref 0.50–1.35)
Calcium, Ion: 1.07 mmol/L — ABNORMAL LOW (ref 1.13–1.30)
GLUCOSE: 586 mg/dL — AB (ref 70–99)
HCT: 35 % — ABNORMAL LOW (ref 39.0–52.0)
Hemoglobin: 11.9 g/dL — ABNORMAL LOW (ref 13.0–17.0)
POTASSIUM: 4.7 meq/L (ref 3.7–5.3)
SODIUM: 142 meq/L (ref 137–147)
TCO2: 11 mmol/L (ref 0–100)

## 2013-11-19 LAB — I-STAT CG4 LACTIC ACID, ED
LACTIC ACID, VENOUS: 7.02 mmol/L — AB (ref 0.5–2.2)
LACTIC ACID, VENOUS: 5.13 mmol/L — AB (ref 0.5–2.2)

## 2013-11-19 LAB — URINE MICROSCOPIC-ADD ON

## 2013-11-19 LAB — CBC
HCT: 27.1 % — ABNORMAL LOW (ref 39.0–52.0)
Hemoglobin: 9.2 g/dL — ABNORMAL LOW (ref 13.0–17.0)
MCH: 29.9 pg (ref 26.0–34.0)
MCHC: 33.9 g/dL (ref 30.0–36.0)
MCV: 88 fL (ref 78.0–100.0)
PLATELETS: 243 10*3/uL (ref 150–400)
RBC: 3.08 MIL/uL — ABNORMAL LOW (ref 4.22–5.81)
RDW: 14.2 % (ref 11.5–15.5)
WBC: 14.6 10*3/uL — AB (ref 4.0–10.5)

## 2013-11-19 LAB — BLOOD GAS, ARTERIAL
Acid-base deficit: 11.9 mmol/L — ABNORMAL HIGH (ref 0.0–2.0)
Bicarbonate: 11.4 mEq/L — ABNORMAL LOW (ref 20.0–24.0)
DRAWN BY: 232811
O2 CONTENT: 2 L/min
O2 Saturation: 91.9 %
PH ART: 7.395 (ref 7.350–7.450)
PO2 ART: 60.8 mmHg — AB (ref 80.0–100.0)
Patient temperature: 97.3
TCO2: 10.6 mmol/L (ref 0–100)
pCO2 arterial: 18.9 mmHg — CL (ref 35.0–45.0)

## 2013-11-19 LAB — COMPREHENSIVE METABOLIC PANEL
ALK PHOS: 61 U/L (ref 39–117)
ALT: 9 U/L (ref 0–53)
ANION GAP: 27 — AB (ref 5–15)
AST: 20 U/L (ref 0–37)
Albumin: 2.4 g/dL — ABNORMAL LOW (ref 3.5–5.2)
BILIRUBIN TOTAL: 0.3 mg/dL (ref 0.3–1.2)
BUN: 79 mg/dL — AB (ref 6–23)
CHLORIDE: 104 meq/L (ref 96–112)
CO2: 10 meq/L — AB (ref 19–32)
Calcium: 9.2 mg/dL (ref 8.4–10.5)
Creatinine, Ser: 4.43 mg/dL — ABNORMAL HIGH (ref 0.50–1.35)
GFR, EST AFRICAN AMERICAN: 13 mL/min — AB (ref >90)
GFR, EST NON AFRICAN AMERICAN: 11 mL/min — AB (ref >90)
GLUCOSE: 587 mg/dL — AB (ref 70–99)
POTASSIUM: 5.1 meq/L (ref 3.7–5.3)
SODIUM: 141 meq/L (ref 137–147)
Total Protein: 6.5 g/dL (ref 6.0–8.3)

## 2013-11-19 LAB — I-STAT TROPONIN, ED: Troponin i, poc: 0.19 ng/mL (ref 0.00–0.08)

## 2013-11-19 LAB — PHOSPHORUS: PHOSPHORUS: 3 mg/dL (ref 2.3–4.6)

## 2013-11-19 LAB — MRSA PCR SCREENING: MRSA by PCR: POSITIVE — AB

## 2013-11-19 LAB — TROPONIN I
Troponin I: <0.3 ng/mL (ref <0.30)
Troponin I: 0.35 ng/mL (ref <0.30)

## 2013-11-19 LAB — MAGNESIUM: Magnesium: 1.3 mg/dL — ABNORMAL LOW (ref 1.5–2.5)

## 2013-11-19 MED ORDER — SODIUM CHLORIDE 0.9 % IV SOLN
INTRAVENOUS | Status: DC
  Administered 2013-11-19: 3.6 [IU]/h via INTRAVENOUS
  Filled 2013-11-19: qty 2.5

## 2013-11-19 MED ORDER — IPRATROPIUM-ALBUTEROL 0.5-2.5 (3) MG/3ML IN SOLN
3.0000 mL | Freq: Four times a day (QID) | RESPIRATORY_TRACT | Status: DC
  Administered 2013-11-19 – 2013-11-25 (×26): 3 mL via RESPIRATORY_TRACT
  Filled 2013-11-19 (×26): qty 3

## 2013-11-19 MED ORDER — HEPARIN SODIUM (PORCINE) 5000 UNIT/ML IJ SOLN
5000.0000 [IU] | Freq: Two times a day (BID) | INTRAMUSCULAR | Status: DC
  Administered 2013-11-19 – 2013-11-25 (×13): 5000 [IU] via SUBCUTANEOUS
  Filled 2013-11-19 (×15): qty 1

## 2013-11-19 MED ORDER — PANTOPRAZOLE SODIUM 40 MG IV SOLR
40.0000 mg | INTRAVENOUS | Status: DC
  Administered 2013-11-19 – 2013-11-24 (×6): 40 mg via INTRAVENOUS
  Filled 2013-11-19 (×7): qty 40

## 2013-11-19 MED ORDER — SODIUM CHLORIDE 0.9 % IV BOLUS (SEPSIS)
1000.0000 mL | Freq: Once | INTRAVENOUS | Status: AC
  Administered 2013-11-19: 1000 mL via INTRAVENOUS

## 2013-11-19 MED ORDER — MONTELUKAST SODIUM 10 MG PO TABS
10.0000 mg | ORAL_TABLET | Freq: Every morning | ORAL | Status: DC
  Administered 2013-11-19 – 2013-11-25 (×6): 10 mg via ORAL
  Filled 2013-11-19 (×6): qty 1

## 2013-11-19 MED ORDER — DEXTROSE 50 % IV SOLN
25.0000 mL | INTRAVENOUS | Status: DC | PRN
  Administered 2013-11-19: 25 mL via INTRAVENOUS
  Filled 2013-11-19: qty 50

## 2013-11-19 MED ORDER — SODIUM CHLORIDE 0.9 % IV SOLN: INTRAVENOUS | Status: AC

## 2013-11-19 MED ORDER — SODIUM CHLORIDE 0.9 % IV SOLN
INTRAVENOUS | Status: DC
  Administered 2013-11-20: 0.3 [IU]/h via INTRAVENOUS
  Filled 2013-11-19 (×3): qty 2.5

## 2013-11-19 MED ORDER — SODIUM CHLORIDE 0.9 % IV SOLN
INTRAVENOUS | Status: DC
  Administered 2013-11-19: 05:00:00 via INTRAVENOUS

## 2013-11-19 MED ORDER — ESCITALOPRAM OXALATE 20 MG PO TABS
20.0000 mg | ORAL_TABLET | Freq: Every morning | ORAL | Status: DC
  Administered 2013-11-19 – 2013-11-22 (×3): 20 mg via ORAL
  Filled 2013-11-19 (×3): qty 1

## 2013-11-19 MED ORDER — VANCOMYCIN HCL IN DEXTROSE 1-5 GM/200ML-% IV SOLN
1000.0000 mg | Freq: Once | INTRAVENOUS | Status: AC
  Administered 2013-11-19: 1000 mg via INTRAVENOUS
  Filled 2013-11-19: qty 200

## 2013-11-19 MED ORDER — DEXTROSE-NACL 5-0.45 % IV SOLN
INTRAVENOUS | Status: DC
  Administered 2013-11-19 – 2013-11-20 (×2): via INTRAVENOUS

## 2013-11-19 MED ORDER — DEXTROSE-NACL 5-0.45 % IV SOLN: INTRAVENOUS | Status: DC

## 2013-11-19 MED ORDER — PIPERACILLIN-TAZOBACTAM IN DEX 2-0.25 GM/50ML IV SOLN
2.2500 g | Freq: Four times a day (QID) | INTRAVENOUS | Status: DC
  Administered 2013-11-19 – 2013-11-22 (×14): 2.25 g via INTRAVENOUS
  Filled 2013-11-19 (×17): qty 50

## 2013-11-19 MED ORDER — CETYLPYRIDINIUM CHLORIDE 0.05 % MT LIQD
7.0000 mL | Freq: Two times a day (BID) | OROMUCOSAL | Status: DC
  Administered 2013-11-19 – 2013-11-25 (×10): 7 mL via OROMUCOSAL

## 2013-11-19 MED ORDER — INSULIN ASPART 100 UNIT/ML ~~LOC~~ SOLN: 1.0000 [IU] | SUBCUTANEOUS | Status: DC

## 2013-11-19 MED ORDER — ENOXAPARIN SODIUM 30 MG/0.3ML ~~LOC~~ SOLN: 30.0000 mg | SUBCUTANEOUS | Status: DC

## 2013-11-19 MED ORDER — CLOPIDOGREL BISULFATE 75 MG PO TABS
75.0000 mg | ORAL_TABLET | Freq: Every day | ORAL | Status: DC
  Administered 2013-11-19 – 2013-11-25 (×6): 75 mg via ORAL
  Filled 2013-11-19 (×9): qty 1

## 2013-11-19 MED ORDER — LEVETIRACETAM 500 MG PO TABS
500.0000 mg | ORAL_TABLET | Freq: Every morning | ORAL | Status: DC
  Administered 2013-11-19: 500 mg via ORAL
  Filled 2013-11-19: qty 1

## 2013-11-19 MED ORDER — DONEPEZIL HCL 10 MG PO TABS
10.0000 mg | ORAL_TABLET | Freq: Every day | ORAL | Status: DC
  Administered 2013-11-20 – 2013-11-24 (×5): 10 mg via ORAL
  Filled 2013-11-19 (×7): qty 1

## 2013-11-19 MED ORDER — DIVALPROEX SODIUM 125 MG PO CPSP
375.0000 mg | ORAL_CAPSULE | Freq: Every day | ORAL | Status: DC
  Administered 2013-11-20 – 2013-11-24 (×5): 375 mg via ORAL
  Filled 2013-11-19 (×8): qty 3

## 2013-11-19 MED ORDER — PIPERACILLIN-TAZOBACTAM 3.375 G IVPB 30 MIN
3.3750 g | Freq: Once | INTRAVENOUS | Status: AC
  Administered 2013-11-19: 3.375 g via INTRAVENOUS
  Filled 2013-11-19: qty 50

## 2013-11-19 MED ORDER — ACETAMINOPHEN 650 MG RE SUPP
650.0000 mg | Freq: Once | RECTAL | Status: AC
  Administered 2013-11-19: 650 mg via RECTAL
  Filled 2013-11-19: qty 1

## 2013-11-19 MED ORDER — CHLORHEXIDINE GLUCONATE 0.12 % MT SOLN
15.0000 mL | Freq: Two times a day (BID) | OROMUCOSAL | Status: DC
  Administered 2013-11-20 – 2013-11-25 (×10): 15 mL via OROMUCOSAL
  Filled 2013-11-19 (×13): qty 15

## 2013-11-19 MED ORDER — METOPROLOL SUCCINATE ER 25 MG PO TB24
50.0000 mg | ORAL_TABLET | ORAL | Status: DC
  Administered 2013-11-19 – 2013-11-20 (×2): 50 mg via ORAL
  Filled 2013-11-19 (×2): qty 2

## 2013-11-19 MED ORDER — SODIUM CHLORIDE 0.9 % IV SOLN
INTRAVENOUS | Status: DC
Start: 2013-11-19 — End: 2013-11-20

## 2013-11-19 NOTE — Progress Notes (Signed)
Hypoglycemic Event  CBG:   Treatment:   Symptoms:   Follow-up CBG: Time:1215 CBG Result:110  Possible Reasons for Event:   Comments/MD notified:    Talmage Naponrad, Kitrina Maurin Wyrick  Remember to initiate Hypoglycemia Order Set & complete

## 2013-11-19 NOTE — Progress Notes (Signed)
CRITICAL VALUE ALERT  Critical value received: Calcium   Date of notification:  11/19/2013   Time of notification:  6.2  Critical value read back:Yes.    Nurse who received alert:  T.Heberto Sturdevant,RN   MD notified (1st page):  Dr.Aronson  Time of first page:  0800  MD notified (2nd page):  Time of second page:  Responding MD:  Tommie ArdHowarda, Scott  Time MD responded: [phone]

## 2013-11-19 NOTE — Progress Notes (Signed)
Attempted to reposition patient multiple times, but patient immediately returns to diagonal position. HOB placed up so patient can clear secretions due to PNA. Will continue to monitor and adjust patient as necessary.

## 2013-11-19 NOTE — Consult Note (Signed)
WOC wound consult note Reason for Consult:Patient seen for assessment and suggestions for care to rash on bilateral buttocks, perineal area and mid back, numerous, mostly partial thickness, but two full thickness wounds Wound type:Dermatitis, suspected contact dermatitis as it is in the pattern consistent with that of containment undergarments (diapers) Pressure Ulcer POA: No Measurement: Diffuse area encompassing bilateral buttocks and a 16 x 14 area on the mid back Wound XBJ:YNWGNFAbed:Shallow, moist, pink lesions, two are with dried serum (scabs)  Drainage (amount, consistency, odor) Scant serous Periwound:Dry, flaking Dressing procedure/placement/frequency:I will provide nursing guidance for hygiene with our house cleanser and follow with application of our zinc-based barrier cream to the affected areas.  We will use no plastic disposable underpads or containment garments and instead keep patient's skin in contact with our antimicrobial, therapeutic linens, DermaTherapy. Prevalon boots are provided as patient crosses his feet and this will prevent pressure ulceration. WOC nursing team will not follow, but will remain available to this patient, the nursing and medical team.  Please re-consult if needed. Thanks, Ladona MowLaurie Sadarius Norman, MSN, RN, GNP, HillmanWOCN, CWON-AP [phone])

## 2013-11-19 NOTE — Progress Notes (Signed)
CARE MANAGEMENT NOTE 11/19/2013  Patient:  [name] Walker,[name] Walker   Account Number:  [id number]  Date Initiated:  11/19/2013  Documentation initiated by:  DAVIS,RHONDA  Subjective/Objective Assessment:   hyperglycemia event admitted and on iv insulin uinfusion     Action/Plan:   home when stable   Anticipated DC Date:  11/22/2013   Anticipated DC Plan:  HOME/SELF CARE  In-house referral  NA      DC Planning Services  CM consult      PAC Choice  NA   Choice offered to / List presented to:  NA      DME agency  NA     HH arranged  NA      HH agency  NA   Status of service:  In process, will continue to follow Medicare Important Message given?   (If response is "NO", the following Medicare IM given date fields will be blank) Date Medicare IM given:   Medicare IM given by:   Date Additional Medicare IM given:   Additional Medicare IM given by:    Discharge Disposition:    Per UR Regulation:  Reviewed for med. necessity/level of care/duration of stay  If discussed at Long Length of Stay Meetings, dates discussed:    Comments:  11/19/2013/Rhonda L. Earlene Plateravis, RN, BSN, CCM: Chart review for medical necessity and patient discharge needs. Case Manager will follow for patient condition changes.

## 2013-11-19 NOTE — Progress Notes (Signed)
INITIAL NUTRITION ASSESSMENT  DOCUMENTATION CODES Per approved criteria  -Severe malnutrition in the context of chronic illness  Pt meets criteria for severe MALNUTRITION in the context of chronic illness as evidenced by severe muscle wasting and subcutaneous fat loss.   INTERVENTION: -Diet advancement per MD -Recommend SLP consult  -Supplement per SLP diet/liquid texture recommendations -RD to continue to monitor  NUTRITION DIAGNOSIS: Inadequate oral intake related to inability to eat as evidenced by NPO status.   Goal: Pt to meet >/= 90% of their estimated nutrition needs    Monitor:  Diet order, swallow profile, labs, weights, total protein/energy intake  Reason for Assessment: MST  80 y.o. male  Admitting Dx: Sepsis  ASSESSMENT: 80 y/o ? h/o Dm ty 2, prior DKA, COPD, Htn, CAD-STEMI 09/27/07, H/o Penile implant 2005, h/o isolated episode P afib, prior diverticular disease + polyps, Subdural hematoma 2/2 effient s/p craniotomy 03/2008, came to emergency room 11/18/12 because of hyperglycemia and fever.   -Pt non-verbal, was unable to provide food/nutrition hx -NPO d/t DKA 2/2 DM2, continues to be insulin drip -Recommend SLP evaluation as RN reported pt exhibits difficulty with cognitive aspects of swallowing (2/2 dementia) -ARF- MD noted pt not a candidate for HD, receiving IVF -Pt w/MRSA skin infection on back -Previous medical records indicate pt with gradual 20 lb weight loss over 2 year period (12% body weight loss, non-significant for time frame) likely r/t aging and chronic diseases -Nutrition Focused Physical Exam:  Subcutaneous Fat:  Orbital Region: WDL Upper Arm Region: moderate wasting Thoracic and Lumbar Region: n/a  Muscle:  Temple Region: moderate wasting Clavicle Bone Region: severe wasting Clavicle and Acromion Bone Region: severe wasting Scapular Bone Region: n/a Dorsal Hand: n/a Patellar Region: severe Anterior Thigh Region: severe Posterior Calf  Region: severe  Edema: none noted     Height: Ht Readings from Last 1 Encounters:  11/19/13 5\' 7"  (1.702 m)    Weight: Wt Readings from Last 1 Encounters:  11/19/13 143 lb 4.8 oz (65 kg)    Ideal Body Weight: 148 lb  % Ideal Body Weight: 97%  Wt Readings from Last 10 Encounters:  11/19/13 143 lb 4.8 oz (65 kg)  02/26/13 141 lb 5 oz (64.1 kg)  12/08/11 164 lb 3.9 oz (74.5 kg)  05/17/11 167 lb (75.751 kg)  03/17/11 164 lb 8 oz (74.617 kg)  02/22/11 146 lb (66.225 kg)    Usual Body Weight: unable to determine  % Usual Body Weight: unable to determine  BMI:  Body mass index is 22.44 kg/(m^2).  Estimated Nutritional Needs: Kcal: 1750-1950 Protein: 80-90 gram Fluid: >/=1800 ml daily  Skin: MRSA infection on back  Diet Order: NPO  EDUCATION NEEDS: -No education needs identified at this time   Intake/Output Summary (Last 24 hours) at 11/19/13 1119 Last data filed at 11/19/13 0940  Gross per 24 hour  Intake   4050 ml  Output      0 ml  Net   4050 ml    Last BM: 10/20   Labs:   Recent Labs Lab 11/19/13 0419 11/19/13 0503 11/19/13 0725 11/19/13 0855  NA 149* 149* 150* 153*  K 4.4 3.3* 3.6* 3.9  CL 115* 122* 119* 121*  CO2 11* 10* 14* 15*  BUN 74* 60* 69* 70*  CREATININE 4.24* 3.41* 4.37* 4.45*  CALCIUM 8.2* 6.2* 7.4* 7.5*  MG  --  1.3*  --   --   PHOS  --  3.0  --   --  GLUCOSE 418* 284* 160* 105*    CBG (last 3)   Recent Labs  11/19/13 0719 11/19/13 0826 11/19/13 0942  GLUCAP 163* 117* 74    Scheduled Meds: . clopidogrel  75 mg Oral Q breakfast  . divalproex  375 mg Oral QHS  . donepezil  10 mg Oral QHS  . escitalopram  20 mg Oral q morning - 10a  . heparin subcutaneous  5,000 Units Subcutaneous Q12H  . ipratropium-albuterol  3 mL Nebulization Q6H  . levETIRAcetam  500 mg Oral q morning - 10a  . metoprolol succinate  50 mg Oral Q24H  . montelukast  10 mg Oral q morning - 10a  . pantoprazole (PROTONIX) IV  40 mg Intravenous  Q24H  . piperacillin-tazobactam (ZOSYN)  IV  2.25 g Intravenous 4 times per day    Continuous Infusions: . sodium chloride Stopped (11/19/13 0836)  . dextrose 5 % and 0.45% NaCl 100 mL/hr at 11/19/13 0806  . insulin (NOVOLIN-R) infusion Stopped (11/19/13 0940)    Past Medical History  Diagnosis Date  . Diabetes mellitus   . Hypertension   . Hyperlipidemia   . Headache(784.0)   . Seizure   . Arthritis   . Anemia   . Blind left eye   . Subdural hematoma     hx recurrent right subdural hematoma  . Coronary artery disease     Past Surgical History  Procedure Laterality Date  . Appendectomy      AGE 73  . Rhino-septoplasty  1976  . Knee arthroscopy  1981    left knee  . Retinal detachment surgery  1989    left  . Cataract extraction  1995    right eye  . Knee arthroscopy  1998    right knee  . Heart catherization  2001  . Knee surgery  2003    revision of left knee  . Penile prosthesis implant  2005  . Shoulder arthroscopy  2005, 2006    right shoulder  . Knee surgery  1998    left knee lateral release  . Replacement total knee bilateral      Lloyd HugerSarah F Marcos Ruelas MS RD LDN Clinical Dietitian [phone]

## 2013-11-19 NOTE — Progress Notes (Signed)
ANTIBIOTIC CONSULT NOTE - INITIAL  Pharmacy Consult for Vancomycin and Zosyn  Indication: pneumonia  No Known Allergies  Patient Measurements: Height: 5\' 7"  (170.2 cm) Weight: 143 lb 4.8 oz (65 kg) IBW/kg (Calculated) : 66.1 Adjusted Body Weight:   Vital Signs: Temp: 98 F (36.7 C) (10/20 0519) Temp Source: Oral (10/20 0519) BP: 105/76 mmHg (10/20 0500) Pulse Rate: 48 (10/20 0415) Intake/Output from previous day: 10/19 0701 - 10/20 0700 In: 4000 [I.V.:4000] Out: -  Intake/Output from this shift: Total I/O In: 4000 [I.V.:4000] Out: -   Labs:  Recent Labs  11/19/13 0116 11/19/13 0120 11/19/13 0419 11/19/13 0503  WBC  --  18.3*  --  13.2*  HGB 11.9* 11.5*  --  8.0*  PLT  --  313  --  PLATELET CLUMPS NOTED ON SMEAR, COUNT APPEARS ADEQUATE  CREATININE 4.70* 4.43* 4.24* 3.41*   Estimated Creatinine Clearance: 15.9 ml/min (by C-G formula based on Cr of 3.41). No results found for this basename: VANCOTROUGH, VANCOPEAK, VANCORANDOM, GENTTROUGH, GENTPEAK, GENTRANDOM, TOBRATROUGH, TOBRAPEAK, TOBRARND, AMIKACINPEAK, AMIKACINTROU, AMIKACIN,  in the last 72 hours   Microbiology: No results found for this or any previous visit (from the past 720 hour(s)).  Medical History: Past Medical History  Diagnosis Date  . Diabetes mellitus   . Hypertension   . Hyperlipidemia   . Headache(784.0)   . Seizure   . Arthritis   . Anemia   . Blind left eye   . Subdural hematoma     hx recurrent right subdural hematoma  . Coronary artery disease     Medications:  Anti-infectives   Start     Dose/Rate Route Frequency Ordered Stop   11/19/13 0645  piperacillin-tazobactam (ZOSYN) IVPB 2.25 g     2.25 g 100 mL/hr over 30 Minutes Intravenous 4 times per day 11/19/13 0634     11/19/13 0145  vancomycin (VANCOCIN) IVPB 1000 mg/200 mL premix     1,000 mg 200 mL/hr over 60 Minutes Intravenous  Once 11/19/13 0131 11/19/13 0336   11/19/13 0145  piperacillin-tazobactam (ZOSYN) IVPB 3.375 g      3.375 g 100 mL/hr over 30 Minutes Intravenous  Once 11/19/13 0131 11/19/13 0211     Assessment: Patient with PNA and very poor renal function.  First dose of antibiotics already given.  Goal of Therapy:  Vancomycin trough level 15-20 mcg/ml Zosyn based on renal function   Plan:  Follow up culture results Will dose vancomycin via levels at this time.  Zosyn 2.25gm iv q6hr Vancomycin level ~48hr after 1st dose with 10/22 AM labs   Darlina GuysGrimsley Jr, Jacquenette ShoneJulian Crowford 11/19/2013,6:34 AM

## 2013-11-19 NOTE — Progress Notes (Signed)
Hypoglycemic Event  CBG: 63  Treatment: D50 IV 25 mL  Symptoms: None  Follow-up CBG: Time:1220 CBG Result:  Possible Reasons for Event: Inadequate meal intake  Comments/MD notified:    Talmage Naponrad, Mikenna Bunkley Wyrick  Remember to initiate Hypoglycemia Order Set & complete

## 2013-11-19 NOTE — Progress Notes (Signed)
CRITICAL VALUE ALERT  Critical value received: CO2       Date of notification:  11/19/2013   Time of notification: 0503     Critical value read back:Yes.    Nurse who received alert:  T.Waneta Fitting,RN  MD notified (1st page): Jacky KindleAronson   Time of first page: 0800  MD notified (2nd page):  Time of second page:  Responding MD: Lyn HenriHowarda,S  Time MD responded: [phone]

## 2013-11-19 NOTE — H&P (Signed)
[facility] History and Physical  Elisabeth MostMichael F Caison EAV:[id number] DOB: 02/20/1931 DOA: 11/19/2013  Referring physician: Holwerda PCP: No primary provider on file.  Specialists: CCM  Chief Complaint:   HPI:  80 y/o ? h/o Dm ty 2, prior DKA, COPD, Htn, CAD-STEMI 09/27/07, H/o Penile implant 2005, h/o isolated episode P afib, prior diverticular disease + polyps, Subdural hematoma 2/2 effient s/p craniotomy 03/2008, came to emergency room 11/18/12 because of hyperglycemia and fever.  Noted derm visit recently excoriation on the back with cultures growing MRSA/s and was treated with Keflex and Bactrim through 11/10/48.   Apparently patient was diagnosed at [facility] with pneumonia 10/19 and given 1 g of Rocephin. On admission had oral MAXIMUM TEMPERATURE 102 and CBC was registering high.  Critical care medicine was consulted for DKA, renal failure, metabolic encephalopathy with normotension.  On admission BUN/creatinine 67/4.7, anion gap 27, lactic acid 7.02,  WBC 18.3, hemoglobin 11.5 Glucose 587   Review of Systems: The patient is essentially non-verbal and much of history has to be obtained by CCM note as well as by interpretation of prior hospital and office visits   Past Medical History  Diagnosis Date  . Diabetes mellitus   . Hypertension   . Hyperlipidemia   . Headache(784.0)   . Seizure   . Arthritis   . Anemia   . Blind left eye   . Subdural hematoma     hx recurrent right subdural hematoma  . Coronary artery disease    Past Surgical History  Procedure Laterality Date  . Appendectomy      AGE 19  . Rhino-septoplasty  1976  . Knee arthroscopy  1981    left knee  . Retinal detachment surgery  1989    left  . Cataract extraction  1995    right eye  . Knee arthroscopy  1998    right knee  . Heart catherization  2001  . Knee surgery  2003    revision of left knee  . Penile prosthesis implant  2005  . Shoulder arthroscopy  2005, 2006    right  shoulder  . Knee surgery  1998    left knee lateral release  . Replacement total knee bilateral     Social History:  History   Social History Narrative  . No narrative on file    No Known Allergies  Family History  Problem Relation Age of Onset  . Hypertension       Prior to Admission medications   Medication Sig Start Date End Date Taking? Authorizing Provider  acetaminophen (TYLENOL) 325 MG tablet Take 650 mg by mouth at bedtime.   Yes Historical Provider, MD  camphor-menthol Wynelle Fanny(SARNA) lotion Apply 1 application topically 2 (two) times daily.   Yes Historical Provider, MD  cefTRIAXone (ROCEPHIN) 1 G injection Inject 1 g into the muscle once.   Yes Historical Provider, MD  cholecalciferol (VITAMIN D) 1000 UNITS tablet Take 1,000 Units by mouth every morning.    Yes Historical Provider, MD  clopidogrel (PLAVIX) 75 MG tablet Take 75 mg by mouth daily with breakfast.   Yes Historical Provider, MD  divalproex (DEPAKOTE SPRINKLE) 125 MG capsule Take 375 mg by mouth at bedtime.   Yes Historical Provider, MD  donepezil (ARICEPT) 10 MG tablet Take 10 mg by mouth at bedtime.   Yes Historical Provider, MD  escitalopram (LEXAPRO) 20 MG tablet Take 20 mg by mouth every morning.   Yes Historical Provider, MD  hydrOXYzine (ATARAX/VISTARIL) 10  MG tablet Take 10 mg by mouth every 6 (six) hours as needed for itching.   Yes Historical Provider, MD  hydrOXYzine (ATARAX/VISTARIL) 10 MG tablet Take 10 mg by mouth daily. AT 9 am.   Yes Historical Provider, MD  insulin aspart (NOVOLOG) 100 UNIT/ML injection Inject 25 Units into the skin as directed. 3 times daily after meals if >50% meal consumed   Yes Historical Provider, MD  insulin detemir (LEVEMIR) 100 UNIT/ML injection Inject 45 Units into the skin 2 (two) times daily.   Yes Historical Provider, MD  levETIRAcetam (KEPPRA) 500 MG tablet Take 500 mg by mouth every morning.    Yes Historical Provider, MD  LORazepam (ATIVAN) 0.5 MG tablet Take 0.5 mg by  mouth at bedtime.   Yes Historical Provider, MD  metFORMIN (GLUCOPHAGE) 1000 MG tablet Take 1,000 mg by mouth 2 (two) times daily with a meal.   Yes Historical Provider, MD  metoprolol succinate (TOPROL-XL) 50 MG 24 hr tablet Take 50 mg by mouth every morning. Take with or immediately following a meal.   Yes Historical Provider, MD  montelukast (SINGULAIR) 10 MG tablet Take 10 mg by mouth every morning.   Yes Historical Provider, MD  Multiple Vitamin (MULTIVITAMIN WITH MINERALS) TABS tablet Take 1 tablet by mouth every morning.   Yes Historical Provider, MD  ipratropium-albuterol (DUONEB) 0.5-2.5 (3) MG/3ML SOLN Take 3 mLs by nebulization as directed. 4 times daily for 5 days patient to begin on 11/19/2013.    Historical Provider, MD  levofloxacin (LEVAQUIN) 250 MG tablet Take 250 mg by mouth daily. 10 day therapy course patient to begin on 11/19/2013.    Historical Provider, MD   Physical Exam: Filed Vitals:   11/19/13 0400 11/19/13 0415 11/19/13 0500 11/19/13 0519  BP: 111/63 117/85 105/76   Pulse: 120 48    Temp:    98 F (36.7 C)  TempSrc:    Oral  Resp: 25 23 24    Height:      Weight:      SpO2: 92% 82%       General:  Frail mute Caucasian male no apparent distress. Nonverbal  Eyes: Extraocular movements intact tracks with his eyes  ENT: Soft supple unable to examine dentition no his mouth from.  Neck: Neck soft  Cardiovascular: S1-S2 tachycardic  Respiratory: Bilateral  Posterior rales, no skin deficits or other issue on the back  Abdomen: Soft nontender  Skin: No lower extremity edema  Musculoskeletal: Range of motion intact but a little contracted  Psychiatric: Flat affect demented  Neurologic: Distal pole commands but has meaningful movements of the extremities  Labs on Admission:  Basic Metabolic Panel:  Recent Labs Lab 11/19/13 0116 11/19/13 0120 11/19/13 0419 11/19/13 0503 11/19/13 0725  NA 142 141 149* 149* 150*  K 4.7 5.1 4.4 3.3* 3.6*  CL 115*  104 115* 122* 119*  CO2  --  10* 11* 10* 14*  GLUCOSE 586* 587* 418* 284* 160*  BUN 69* 79* 74* 60* 69*  CREATININE 4.70* 4.43* 4.24* 3.41* 4.37*  CALCIUM  --  9.2 8.2* 6.2* 7.4*  MG  --   --   --  1.3*  --   PHOS  --   --   --  3.0  --    Liver Function Tests:  Recent Labs Lab 11/19/13 0120  AST 20  ALT 9  ALKPHOS 61  BILITOT 0.3  PROT 6.5  ALBUMIN 2.4*   No results found for this basename: LIPASE, AMYLASE,  in the last 168 hours No results found for this basename: AMMONIA,  in the last 168 hours CBC:  Recent Labs Lab 11/19/13 0116 11/19/13 0120 11/19/13 0503 11/19/13 0725  WBC  --  18.3* 13.2* 14.6*  NEUTROABS  --  16.5* 11.8*  --   HGB 11.9* 11.5* 8.0* 9.2*  HCT 35.0* 34.0* 23.5* 27.1*  MCV  --  89.0 89.7 88.0  PLT  --  313 PLATELET CLUMPS NOTED ON SMEAR, COUNT APPEARS ADEQUATE 243   Cardiac Enzymes:  Recent Labs Lab 11/19/13 0503  TROPONINI <0.30    BNP (last 3 results) No results found for this basename: PROBNP,  in the last 8760 hours CBG:  Recent Labs Lab 11/19/13 0102 11/19/13 0310 11/19/13 0446 11/19/13 0552 11/19/13 0826  GLUCAP 525* 420* 323* 250* 117*    Radiological Exams on Admission: Dg Chest 2 View  11/19/2013   CLINICAL DATA:  Hyperglycemia. Fever for 48 hr. Diagnosed with pneumonia on 10/18. Increased weakness and lethargy.  EXAM: CHEST  2 VIEW  COMPARISON:  02/23/2013  FINDINGS: Normal heart size and pulmonary vascularity. Shallow inspiration. Infiltration or atelectasis in the left lung base may be compatible with pneumonia. No blunting of costophrenic angles. No pneumothorax. Tortuous and ectatic aorta.  IMPRESSION: Infiltration or atelectasis in the left lung base may be compatible with pneumonia.   Electronically Signed   By: Burman Nieves M.D.   On: 11/19/2013 01:42    EKG: Independently reviewed. Low-voltage complexes PR interval 0.12 QRS axis degrees, no ST-T wave specific changes  Assessment/Plan    Sepsis-likely  pneumonia on CXR--unlear if bacteremic from recent MRSA, Klebsiella skin infection, UC. Blood cultures performed 10/20 in process. Left chest trending down 7-->5. Obtain Pro Calcitonin.  White count still elevated at 13.2. Continue Zosyn and vancomycin. Active Problems:   DKA secondary to type 2 diabetes mellitus, likely precipitated by sepsis-anion gap is still 17.  Will continue insulin drip. We will need speech therapy to ensure patient is safe to eat as he closes his mouth tight and with his dementia I am not sure if he can safely take oral medication   Possible pyelonephritis-follow urine culture. Continue empiric antibiotics as above   Acute renal failure with hypernatremia-baseline BUN/creatinine 18/1.4. On admission 69/4.7. Continue IV or D5 depending on DKA protocol at 75 cc an hour   Demand ischemia secondary to sepsis, renal failure-0.2 troponin 0.19 continued to trend. Regular troponin less than 0 30. EKG none concerning for acute infarct at this time   Lactic acidosis-combined type / type B, as is on metformin in addition to overt sepsis and renal failure. We will get pro-calcitonin trend    55 minutes Back to Memory Unit [facility] when more stable No family present at bedside-called Son Stavros, [place] [phone].  Patient started on a regular unit.  He does have dementia, but has no short term memory, but can remember family members names as well as people, but recently was "out of it".  He does seem to have behavioral disturbances that require sedation.   Rhetta Mura [facility] Pager [phone] If 7PM-7AM, please contact night-coverage [email or web address] Password [email or web address] 11/19/2013, 8:59 AM

## 2013-11-19 NOTE — Progress Notes (Signed)
Unable to complete admit hx due to patient's advanced dementia. Pt's answer were not appropriate and garbled. Will continue to monitor. .Marland Kitchen

## 2013-11-19 NOTE — ED Provider Notes (Signed)
CSN: [id number]     Arrival date & time 11/19/13  0040 History   First MD Initiated Contact with Patient 11/19/13 0044     Chief Complaint  Patient presents with  . Hyperglycemia  . Fever     (Consider location/radiation/quality/duration/timing/severity/associated sxs/prior Treatment) Patient is a 80 y.o. male presenting with hyperglycemia and fever. The history is provided by the EMS personnel and medical records. The history is limited by the condition of the patient.  Hyperglycemia Blood sugar level PTA:  HI Severity:  Severe Onset quality:  Gradual Duration:  1 day Timing:  Constant Progression:  Unchanged Chronicity:  Recurrent Diabetes status:  Controlled with insulin Context: not change in medication   Relieved by:  Nothing Ineffective treatments:  None tried Associated symptoms: fever   Associated symptoms: no vomiting  Fatigue: 102.   Fever:    Timing:  Constant   Temp source:  Oral   Progression:  Unchanged Risk factors: no obesity   Fever Associated symptoms: no vomiting     Past Medical History  Diagnosis Date  . Coronary artery disease   . Diabetes mellitus   . Hypertension   . Hyperlipidemia   . Headache(784.0)   . Seizure   . Arthritis   . Anemia   . Blind left eye   . Subdural hematoma     hx recurrent right subdural hematoma   Past Surgical History  Procedure Laterality Date  . Appendectomy      AGE 59  . Rhino-septoplasty  1976  . Knee arthroscopy  1981    left knee  . Retinal detachment surgery  1989    left  . Cataract extraction  1995    right eye  . Knee arthroscopy  1998    right knee  . Heart catherization  2001  . Knee surgery  2003    revision of left knee  . Penile prosthesis implant  2005  . Shoulder arthroscopy  2005, 2006    right shoulder  . Knee surgery  1998    left knee lateral release  . Replacement total knee bilateral     History reviewed. No pertinent family history. History  Substance Use Topics  .  Smoking status: Former Smoker -- 19 years    Quit date: 02/21/1966  . Smokeless tobacco: Never Used  . Alcohol Use: No     Comment: quit drinking in 2011    Review of Systems  Unable to perform ROS Constitutional: Positive for fever. Fatigue: 102.  Gastrointestinal: Negative for vomiting.      Allergies  Review of patient's allergies indicates no known allergies.  Home Medications   Prior to Admission medications   Medication Sig Start Date End Date Taking? Authorizing Provider  acetaminophen (TYLENOL) 325 MG tablet Take 650 mg by mouth at bedtime.    Historical Provider, MD  cholecalciferol (VITAMIN D) 1000 UNITS tablet Take 1,000 Units by mouth every morning.     Historical Provider, MD  clopidogrel (PLAVIX) 75 MG tablet Take 75 mg by mouth daily with breakfast.    Historical Provider, MD  donepezil (ARICEPT) 10 MG tablet Take 10 mg by mouth at bedtime.    Historical Provider, MD  escitalopram (LEXAPRO) 20 MG tablet Take 20 mg by mouth every morning.    Historical Provider, MD  famotidine (PEPCID) 20 MG tablet Take 20 mg by mouth 2 (two) times daily.    Historical Provider, MD  glimepiride (AMARYL) 4 MG tablet Take 8 mg by mouth  daily with breakfast.    Historical Provider, MD  glucagon (GLUCAGEN) 1 MG SOLR injection Inject 1 mg into the vein once as needed for low blood sugar.    Historical Provider, MD  levETIRAcetam (KEPPRA) 500 MG tablet Take 500 mg by mouth every morning.     Historical Provider, MD  LORazepam (ATIVAN) 0.5 MG tablet Take 1 tablet (0.5 mg total) by mouth 2 (two) times daily. 02/26/13   Penny Piarlando Vega, MD  metFORMIN (GLUCOPHAGE) 1000 MG tablet Take 1,000 mg by mouth 2 (two) times daily with a meal.    Historical Provider, MD  metoprolol succinate (TOPROL-XL) 50 MG 24 hr tablet Take 50 mg by mouth every morning. Take with or immediately following a meal.    Historical Provider, MD  montelukast (SINGULAIR) 10 MG tablet Take 10 mg by mouth every morning.     Historical Provider, MD  Multiple Vitamin (MULTIVITAMIN WITH MINERALS) TABS tablet Take 1 tablet by mouth every morning.    Historical Provider, MD  pantoprazole (PROTONIX) 20 MG tablet Take 20 mg by mouth daily.    Historical Provider, MD   BP 118/64  Pulse 117  Temp(Src) 101.1 F (38.4 C) (Rectal)  Resp 20  SpO2 91% Physical Exam  Constitutional: He appears well-developed and well-nourished.  HENT:  Head: Normocephalic and atraumatic.  Tacky mucus membranes  Eyes: Conjunctivae are normal. Pupils are equal, round, and reactive to light.  Neck: Neck supple.  Cardiovascular: Normal rate, regular rhythm and intact distal pulses.   Pulmonary/Chest: Effort normal and breath sounds normal. He has no wheezes. He has no rales.  Abdominal: Soft. Bowel sounds are normal. There is no tenderness. There is no rebound and no guarding.  Musculoskeletal: Normal range of motion. He exhibits no edema.  Lymphadenopathy:    He has no cervical adenopathy.  Neurological: He is alert. He has normal reflexes.  Skin: Skin is warm and dry. He is not diaphoretic.  Psychiatric:  unaBLE TO OBTAIN    ED Course  Procedures (including critical care time) Labs Review Labs Reviewed  CBG MONITORING, ED - Abnormal; Notable for the following:    Glucose-Capillary 525 (*)    All other components within normal limits  CULTURE, BLOOD (ROUTINE X 2)  CULTURE, BLOOD (ROUTINE X 2)  CBC WITH DIFFERENTIAL  COMPREHENSIVE METABOLIC PANEL  URINALYSIS, ROUTINE W REFLEX MICROSCOPIC  I-STAT CHEM 8, ED  I-STAT TROPOININ, ED  I-STAT CG4 LACTIC ACID, ED    Imaging Review No results found.   EKG Interpretation None      MDM   Final diagnoses:  None     Date: 11/19/2013  Rate:115  Rhythm: sinus tachycardia  QRS Axis: normal  Intervals: normal  ST/T Wave abnormalities: normal  Conduction Disutrbances:none  Narrative Interpretation:   Old EKG Reviewed: unchanged    158 case d/w Dr. Darrick Pennaeterding, to be  evaluated by PCCM.  Consult but admission to PMD per Dr. Marchelle Gearingramaswamy  Results for orders placed during the hospital encounter of 11/19/13  CBC WITH DIFFERENTIAL      Result Value Ref Range   WBC 18.3 (*) 4.0 - 10.5 K/uL   RBC 3.82 (*) 4.22 - 5.81 MIL/uL   Hemoglobin 11.5 (*) 13.0 - 17.0 g/dL   HCT 16.134.0 (*) 09.639.0 - 04.552.0 %   MCV 89.0  78.0 - 100.0 fL   MCH 30.1  26.0 - 34.0 pg   MCHC 33.8  30.0 - 36.0 g/dL   RDW 40.914.1  81.111.5 - 91.415.5 %  Platelets 313  150 - 400 K/uL   Neutrophils Relative % 90 (*) 43 - 77 %   Lymphocytes Relative 4 (*) 12 - 46 %   Monocytes Relative 6  3 - 12 %   Eosinophils Relative 0  0 - 5 %   Basophils Relative 0  0 - 1 %   Neutro Abs 16.5 (*) 1.7 - 7.7 K/uL   Lymphs Abs 0.7  0.7 - 4.0 K/uL   Monocytes Absolute 1.1 (*) 0.1 - 1.0 K/uL   Eosinophils Absolute 0.0  0.0 - 0.7 K/uL   Basophils Absolute 0.0  0.0 - 0.1 K/uL   RBC Morphology ELLIPTOCYTES     WBC Morphology TOXIC GRANULATION    COMPREHENSIVE METABOLIC PANEL      Result Value Ref Range   Sodium 141  137 - 147 mEq/L   Potassium 5.1  3.7 - 5.3 mEq/L   Chloride 104  96 - 112 mEq/L   CO2 10 (*) 19 - 32 mEq/L   Glucose, Bld 587 (*) 70 - 99 mg/dL   BUN 79 (*) 6 - 23 mg/dL   Creatinine, Ser 1.61 (*) 0.50 - 1.35 mg/dL   Calcium 9.2  8.4 - 09.6 mg/dL   Total Protein 6.5  6.0 - 8.3 g/dL   Albumin 2.4 (*) 3.5 - 5.2 g/dL   AST 20  0 - 37 U/L   ALT 9  0 - 53 U/L   Alkaline Phosphatase 61  39 - 117 U/L   Total Bilirubin 0.3  0.3 - 1.2 mg/dL   GFR calc non Af Amer 11 (*) >90 mL/min   GFR calc Af Amer 13 (*) >90 mL/min   Anion gap 27 (*) 5 - 15  URINALYSIS, ROUTINE W REFLEX MICROSCOPIC      Result Value Ref Range   Color, Urine YELLOW  YELLOW   APPearance CLOUDY (*) CLEAR   Specific Gravity, Urine 1.026  1.005 - 1.030   pH 5.0  5.0 - 8.0   Glucose, UA >1000 (*) NEGATIVE mg/dL   Hgb urine dipstick TRACE (*) NEGATIVE   Bilirubin Urine NEGATIVE  NEGATIVE   Ketones, ur NEGATIVE  NEGATIVE mg/dL   Protein, ur 30  (*) NEGATIVE mg/dL   Urobilinogen, UA 0.2  0.0 - 1.0 mg/dL   Nitrite NEGATIVE  NEGATIVE   Leukocytes, UA NEGATIVE  NEGATIVE  URINE MICROSCOPIC-ADD ON      Result Value Ref Range   Squamous Epithelial / LPF RARE  RARE   WBC, UA 7-10  <3 WBC/hpf   RBC / HPF 3-6  <3 RBC/hpf   Bacteria, UA MANY (*) RARE   Casts GRANULAR CAST (*) NEGATIVE  BLOOD GAS, ARTERIAL      Result Value Ref Range   O2 Content 2.0     Delivery systems NASAL CANNULA     pH, Arterial 7.395  7.350 - 7.450   pCO2 arterial 18.9 (*) 35.0 - 45.0 mmHg   pO2, Arterial 60.8 (*) 80.0 - 100.0 mmHg   Bicarbonate 11.4 (*) 20.0 - 24.0 mEq/L   TCO2 10.6  0 - 100 mmol/L   Acid-base deficit 11.9 (*) 0.0 - 2.0 mmol/L   O2 Saturation 91.9     Patient temperature 97.3     Collection site RIGHT RADIAL     Drawn by [id number]     Sample type ARTERIAL     Allens test (pass/fail) PASS  PASS  I-STAT CHEM 8, ED      Result Value  Ref Range   Sodium 142  137 - 147 mEq/L   Potassium 4.7  3.7 - 5.3 mEq/L   Chloride 115 (*) 96 - 112 mEq/L   BUN 69 (*) 6 - 23 mg/dL   Creatinine, Ser 2.95 (*) 0.50 - 1.35 mg/dL   Glucose, Bld 621 (*) 70 - 99 mg/dL   Calcium, Ion 3.08 (*) 1.13 - 1.30 mmol/L   TCO2 11  0 - 100 mmol/L   Hemoglobin 11.9 (*) 13.0 - 17.0 g/dL   HCT 65.7 (*) 84.6 - 96.2 %   Comment NOTIFIED PHYSICIAN    I-STAT TROPOININ, ED      Result Value Ref Range   Troponin i, poc 0.19 (*) 0.00 - 0.08 ng/mL   Comment NOTIFIED PHYSICIAN     Comment 3           I-STAT CG4 LACTIC ACID, ED      Result Value Ref Range   Lactic Acid, Venous 7.02 (*) 0.5 - 2.2 mmol/L  CBG MONITORING, ED      Result Value Ref Range   Glucose-Capillary 525 (*) 70 - 99 mg/dL   Comment 1 Documented in Chart     Comment 2 Notify RN    CBG MONITORING, ED      Result Value Ref Range   Glucose-Capillary 420 (*) 70 - 99 mg/dL   Comment 1 Notify RN     Comment 2 Documented in Chart    I-STAT CG4 LACTIC ACID, ED      Result Value Ref Range   Lactic Acid, Venous  5.13 (*) 0.5 - 2.2 mmol/L   Dg Chest 2 View  11/19/2013   CLINICAL DATA:  Hyperglycemia. Fever for 48 hr. Diagnosed with pneumonia on 10/18. Increased weakness and lethargy.  EXAM: CHEST  2 VIEW  COMPARISON:  02/23/2013  FINDINGS: Normal heart size and pulmonary vascularity. Shallow inspiration. Infiltration or atelectasis in the left lung base may be compatible with pneumonia. No blunting of costophrenic angles. No pneumothorax. Tortuous and ectatic aorta.  IMPRESSION: Infiltration or atelectasis in the left lung base may be compatible with pneumonia.   Electronically Signed   By: Burman Nieves M.D.   On: 11/19/2013 01:42    Medications  dextrose 5 %-0.45 % sodium chloride infusion (not administered)  insulin regular (NOVOLIN R,HUMULIN R) 250 Units in sodium chloride 0.9 % 250 mL (1 Units/mL) infusion (3.6 Units/hr Intravenous New Bag/Given 11/19/13 0344)  sodium chloride 0.9 % bolus 1,000 mL (0 mLs Intravenous Stopped 11/19/13 0211)  sodium chloride 0.9 % bolus 1,000 mL (0 mLs Intravenous Stopped 11/19/13 0359)  vancomycin (VANCOCIN) IVPB 1000 mg/200 mL premix (0 mg Intravenous Stopped 11/19/13 0336)  piperacillin-tazobactam (ZOSYN) IVPB 3.375 g (0 g Intravenous Stopped 11/19/13 0211)  acetaminophen (TYLENOL) suppository 650 mg (650 mg Rectal Given 11/19/13 0138)  sodium chloride 0.9 % bolus 1,000 mL (0 mLs Intravenous Stopped 11/19/13 0336)  sodium chloride 0.9 % bolus 1,000 mL (1,000 mLs Intravenous New Bag/Given 11/19/13 0428)  sodium chloride 0.9 % bolus 1,000 mL (1,000 mLs Intravenous New Bag/Given 11/19/13 0428)   CRITICAL CARE Performed by: Jasmine Awe Total critical care time: 90 minutes  Critical care time was exclusive of separately billable procedures and treating other patients. Critical care was necessary to treat or prevent imminent or life-threatening deterioration. Critical care was time spent personally by me on the following activities: development of  treatment plan with patient and/or surrogate as well as nursing, discussions with consultants, evaluation of patient's response  to treatment, examination of patient, obtaining history from patient or surrogate, ordering and performing treatments and interventions, ordering and review of laboratory studies, ordering and review of radiographic studies, pulse oximetry and re-evaluation of patient's condition. MDM Number of Diagnoses or Management Options Acute renal failure with other specified pathological lesion in kidney:  Diabetic ketoacidosis without coma associated with diabetes mellitus due to underlying condition:  Healthcare-associated pneumonia:  UTI (lower urinary tract infection):  Critical Care Total time providing critical care: 75-105 minutes MDM Reviewed: previous chart, nursing note and vitals Reviewed previous: ECG and labs Interpretation: labs, ECG and x-ray (elevated WBC, elevated lactate, anion gap 25.  Acute renal failure with elevated BUN UTI) Total time providing critical care: 75-105 minutes. This excludes time spent performing separately reportable procedures and services. Consults: pulmonary and admitting MD    Zaquan Duffner K Abbrielle Batts-Rasch, MD 11/19/13 [phone]

## 2013-11-19 NOTE — ED Notes (Signed)
[facility] with c/o hyperglycemia and fever.  Per EMS, staff at the facility reported that pt was diagnosed with pneumonia yesterday and was given Rocephin 1 g IM.  Pt was observed to be more weak tonight, with very poor appetite and intake.  Pt has had a temp of T102 (oral) and his CBG checks were registering "HI".  Pt presents to ED alert but appears very weak, on O2 at @LPM  via [place].

## 2013-11-19 NOTE — Consult Note (Addendum)
PULMONARY / CRITICAL CARE MEDICINE   Name: [name] Walker MRN: [id number] DOB: 1931-05-29   PCP ARONSON,RICHARD A, MD  ADMISSION DATE:  11/19/2013 CONSULTATION DATE:  11/19/2013   REFERRING MD :  Dr April Palumbo-Rusch of ER   CHIEF COMPLAINT:  Severe sepsis, renal failuire in patient with dementia and SNF resident, failure to thrive with recent back excoriation due to MRSA and MDR [facility]   INITIAL PRESENTATION: - see evetns  STUDIES/ EVENT 11/19/2013 - ADMIT    HISTORY OF PRESENT ILLNESS:  Hx provided by ER notes and review of outside records and ER nurse  80 year old only limited information available. He is DNAR (has yellow form) and with failure to thrive. Has DM on metformin and other medical issues. Seen around 9/28/.15 at [facility] for excoriation in back with subsequent cultures growing MRSA and Klebsiella sensitive only to bactrim. Was on Rx with cephelaxin and bactrim through approx 11/10/13. Most recently sugars have been very high past 1-3 days with fever and diagnosed with left sided pneumonia and given rocephin on 11/18/13 (might have gotten levaquin nos around this time but had continued fever, poor appetite and intake, generalized weakness and referred to ER.  In ER patient, acidotic with renal failure and continued encephalopathy and PCCM consulted to help triage. Normotensive all along  PAST MEDICAL HISTORY :   has a past medical history of Diabetes mellitus; Hypertension; Hyperlipidemia; Headache(784.0); Seizure; Arthritis; Anemia; Blind left eye; Subdural hematoma; and Coronary artery disease.  has past surgical history that includes Appendectomy; rhino-septoplasty (1976); Knee arthroscopy (1981); Retinal detachment surgery (1989); Cataract extraction (1995); Knee arthroscopy (1998); heart catherization (2001); Knee surgery (2003); Penile prosthesis implant (2005); Shoulder arthroscopy (2005, 2006); Knee surgery (1998); and Replacement total knee  bilateral. Prior to Admission medications   Medication Sig Start Date End Date Taking? Authorizing Provider  acetaminophen (TYLENOL) 325 MG tablet Take 650 mg by mouth at bedtime.   Yes Historical Provider, MD  camphor-menthol Wynelle Fanny) lotion Apply 1 application topically 2 (two) times daily.   Yes Historical Provider, MD  cefTRIAXone (ROCEPHIN) 1 G injection Inject 1 g into the muscle once.   Yes Historical Provider, MD  cholecalciferol (VITAMIN D) 1000 UNITS tablet Take 1,000 Units by mouth every morning.    Yes Historical Provider, MD  clopidogrel (PLAVIX) 75 MG tablet Take 75 mg by mouth daily with breakfast.   Yes Historical Provider, MD  divalproex (DEPAKOTE SPRINKLE) 125 MG capsule Take 375 mg by mouth at bedtime.   Yes Historical Provider, MD  donepezil (ARICEPT) 10 MG tablet Take 10 mg by mouth at bedtime.   Yes Historical Provider, MD  escitalopram (LEXAPRO) 20 MG tablet Take 20 mg by mouth every morning.   Yes Historical Provider, MD  hydrOXYzine (ATARAX/VISTARIL) 10 MG tablet Take 10 mg by mouth every 6 (six) hours as needed for itching.   Yes Historical Provider, MD  hydrOXYzine (ATARAX/VISTARIL) 10 MG tablet Take 10 mg by mouth daily. AT 9 am.   Yes Historical Provider, MD  insulin aspart (NOVOLOG) 100 UNIT/ML injection Inject 25 Units into the skin as directed. 3 times daily after meals if >50% meal consumed   Yes Historical Provider, MD  insulin detemir (LEVEMIR) 100 UNIT/ML injection Inject 45 Units into the skin 2 (two) times daily.   Yes Historical Provider, MD  levETIRAcetam (KEPPRA) 500 MG tablet Take 500 mg by mouth every morning.    Yes Historical Provider, MD  LORazepam (ATIVAN) 0.5 MG tablet Take 0.5  mg by mouth at bedtime.   Yes Historical Provider, MD  metFORMIN (GLUCOPHAGE) 1000 MG tablet Take 1,000 mg by mouth 2 (two) times daily with a meal.   Yes Historical Provider, MD  metoprolol succinate (TOPROL-XL) 50 MG 24 hr tablet Take 50 mg by mouth every morning. Take with or  immediately following a meal.   Yes Historical Provider, MD  montelukast (SINGULAIR) 10 MG tablet Take 10 mg by mouth every morning.   Yes Historical Provider, MD  Multiple Vitamin (MULTIVITAMIN WITH MINERALS) TABS tablet Take 1 tablet by mouth every morning.   Yes Historical Provider, MD  ipratropium-albuterol (DUONEB) 0.5-2.5 (3) MG/3ML SOLN Take 3 mLs by nebulization as directed. 4 times daily for 5 days patient to begin on 11/19/2013.    Historical Provider, MD  levofloxacin (LEVAQUIN) 250 MG tablet Take 250 mg by mouth daily. 10 day therapy course patient to begin on 11/19/2013.    Historical Provider, MD   No Known Allergies  FAMILY HISTORY:  has no family status information on file.  SOCIAL HISTORY:  reports that he quit smoking about 47 years ago. He has never used smokeless tobacco. He reports that he does not drink alcohol or use illicit drugs.   ROS: unelicitable due to critical illness  VITAL SIGNS: Temp:  [101.1 F (38.4 C)] 101.1 F (38.4 C) (10/20 0055) Pulse Rate:  [48-120] 48 (10/20 0415) Resp:  [18-31] 23 (10/20 0415) BP: (89-132)/(44-85) 117/85 mmHg (10/20 0415) SpO2:  [82 %-95 %] 82 % (10/20 0415) Weight:  [65 kg (143 lb 4.8 oz)] 65 kg (143 lb 4.8 oz) (10/20 0323) HEMODYNAMICS:   VENTILATOR SETTINGS:   INTAKE / OUTPUT:  Intake/Output Summary (Last 24 hours) at 11/19/13 0431 Last data filed at 11/19/13 0359  Gross per 24 hour  Intake   2000 ml  Output      0 ml  Net   2000 ml    PHYSICAL EXAMINATION: General:  Frail elderly male. Looks ill Neuro:  Alert but non-communicative. Moves all 4s HEENT:  Supple neck Cardiovascular:  TAchycardic. Normotensive. Normal heart sounds Lungs:  Mild tachypnea due to acidosi. Left side crackles + Abdomen:  Soft, Non tender, obese Musculoskeletal:  No cyanosis, no clubbing, no edema Skin:  Intact anteriorly (noted hx of back excoriation and infection 10/28/13)   LABS:  PULMONARY  Recent Labs Lab 11/19/13 0116  11/19/13 0405  PHART  --  7.395  PCO2ART  --  18.9*  PO2ART  --  60.8*  HCO3  --  11.4*  TCO2 11 10.6  O2SAT  --  91.9    CBC  Recent Labs Lab 11/19/13 0116 11/19/13 0120  HGB 11.9* 11.5*  HCT 35.0* 34.0*  WBC  --  18.3*  PLT  --  313    COAGULATION No results found for this basename: INR,  in the last 168 hours  CARDIAC  No results found for this basename: TROPONINI,  in the last 168 hours No results found for this basename: PROBNP,  in the last 168 hours   CHEMISTRY  Recent Labs Lab 11/19/13 0116 11/19/13 0120  NA 142 141  K 4.7 5.1  CL 115* 104  CO2  --  10*  GLUCOSE 586* 587*  BUN 69* 79*  CREATININE 4.70* 4.43*  CALCIUM  --  9.2   Estimated Creatinine Clearance: 12.2 ml/min (by C-G formula based on Cr of 4.43).   LIVER  Recent Labs Lab 11/19/13 0120  AST 20  ALT 9  ALKPHOS 61  BILITOT 0.3  PROT 6.5  ALBUMIN 2.4*     INFECTIOUS  Recent Labs Lab 11/19/13 0117 11/19/13 0426  LATICACIDVEN 7.02* 5.13*     ENDOCRINE CBG (last 3)   Recent Labs  11/19/13 0102 11/19/13 0310  GLUCAP 525* 420*         IMAGING x48h Dg Chest 2 View  11/19/2013   CLINICAL DATA:  Hyperglycemia. Fever for 48 hr. Diagnosed with pneumonia on 10/18. Increased weakness and lethargy.  EXAM: CHEST  2 VIEW  COMPARISON:  02/23/2013  FINDINGS: Normal heart size and pulmonary vascularity. Shallow inspiration. Infiltration or atelectasis in the left lung base may be compatible with pneumonia. No blunting of costophrenic angles. No pneumothorax. Tortuous and ectatic aorta.  IMPRESSION: Infiltration or atelectasis in the left lung base may be compatible with pneumonia.   Electronically Signed   By: Burman Nieves M.D.   On: 11/19/2013 01:42        ASSESSMENT / PLAN:  PULMONARY OETT - he is DNAR A: Acute Mild resp failure due acidosis and LL pneumonia  P:   DNI O2 for pulse ox > 92%  CARDIOVASCULAR CVL - none A:  Known CAD Severe Sepsis but not in  shock P:  Fluid bolus MAP goal > 65 Rule out MI  RENAL A: Acute renal failure  Lactic acidosis - poor clearance  In setting of metformin and sepsis Urine described as foul looking by ER - suspect UTI P:   Hydrate aggressviely Not a HD Candidate for metformin toxicity Track lacate and creatinine  GASTROINTESTINAL A:  Large Hiatal hernia on scans P:   PPI  HEMATOLOGIC A:  Anemia of critical illness P:  - PRBC for hgb </= 6.9gm%    - exceptions are   -  if ACS susepcted/confirmed then transfuse for hgb </= 8.0gm%,  or    -  If septic shock first 24h and scvo2 < 70% then transfuse for hgb </= 9.0gm%   - active bleeding with hemodynamic instability, then transfuse regardless of hemoglobin value   At at all times try to transfuse 1 unit prbc as possible with exception of active hemorrhage  - Heparin for dVT proph  INFECTIOUS BCx2 11/19/2013 UC 11/19/2013  A:   BAck skin infection MRSA and Klebsiella sensitive only to bactrim on 10/28/13 Now with LLL PNA from SNF - high risk for MRSA and MDR   P:   Anti-infectives   Start     Dose/Rate Route Frequency Ordered Stop   11/19/13 0145  vancomycin (VANCOCIN) IVPB 1000 mg/200 mL premix     1,000 mg 200 mL/hr over 60 Minutes Intravenous  Once 11/19/13 0131 11/19/13 0336   11/19/13 0145  piperacillin-tazobactam (ZOSYN) IVPB 3.375 g     3.375 g 100 mL/hr over 30 Minutes Intravenous  Once 11/19/13 0131 11/19/13 0211      ENDOCRINE A:  Diabetes - on metformin   P:   Dc metformin SSI  NEUROLOGIC A:  Dementia - possibly advanced. Currently non verbal ? baseline P:   Monitor  DERM A Hx of back skin infection 10/28/13  P Wound care consult  FAMILY Updates: None at bedside 11/19/2013 in ER Interdisciplinary Family Meeting: due for this by 11/26/13   CODE - has yellow form from SNF. Below is based on that    Code Status Orders        Start     Ordered   11/19/13 0446  Limited resuscitation (code)  Continuous     Question Answer Comment  In the event of cardiac or respiratory ARREST: Initiate Code Blue, Call Rapid Response No   In the event of cardiac or respiratory ARREST: Perform CPR No   In the event of cardiac or respiratory ARREST: Perform Intubation/Mechanical Ventilation No   In the event of cardiac or respiratory ARREST: Use NIPPV/BiPAp only if indicated Yes   In the event of cardiac or respiratory ARREST: Administer ACLS medications if indicated No   In the event of cardiac or respiratory ARREST: Perform Defibrillation or Cardioversion if indicated No   Comments ok for pressors      11/19/13 0446      DISPO  - internal medicine to admit  -CCM consult - CCM will see again 11/20/13     The patient is critically ill with multiple organ systems failure and requires high complexity decision making for assessment and support, frequent evaluation and titration of therapies, application of advanced monitoring technologies and extensive interpretation of multiple databases.   Critical Care Time devoted to patient care services described in this note is  45  Minutes. This time reflects time of care of this signee Dr Kalman ShanMurali Lorell Thibodaux. This critical care time does not reflect procedure time, or teaching time or supervisory time of PA/NP/Med student/Med Resident etc but could involve care discussion time    Dr. Kalman ShanMurali Khale Nigh, M.D., [facility] Center[facility].C.C.P Pulmonary and Critical Care Medicine Staff Physician  System [facility] Pager: [phone], If no answer or between  15:00h - 7:00h: call [phone]  11/19/2013 4:56 AM

## 2013-11-19 NOTE — ED Notes (Signed)
cbg 525

## 2013-11-19 NOTE — ED Notes (Signed)
Bed: [id number] Expected date: 11/19/13 Expected time: 12:23 AM Means of arrival: Ambulance Comments: hyperglycemic

## 2013-11-20 ENCOUNTER — Encounter (HOSPITAL_COMMUNITY): Payer: Self-pay | Admitting: *Deleted

## 2013-11-20 DIAGNOSIS — E1310 Other specified diabetes mellitus with ketoacidosis without coma: Secondary | ICD-10-CM

## 2013-11-20 DIAGNOSIS — E876 Hypokalemia: Secondary | ICD-10-CM

## 2013-11-20 DIAGNOSIS — E0810 Diabetes mellitus due to underlying condition with ketoacidosis without coma: Secondary | ICD-10-CM

## 2013-11-20 DIAGNOSIS — I10 Essential (primary) hypertension: Secondary | ICD-10-CM

## 2013-11-20 LAB — GLUCOSE, CAPILLARY
GLUCOSE-CAPILLARY: 92 mg/dL (ref 70–99)
GLUCOSE-CAPILLARY: 134 mg/dL — AB (ref 70–99)
GLUCOSE-CAPILLARY: 157 mg/dL — AB (ref 70–99)
GLUCOSE-CAPILLARY: 139 mg/dL — AB (ref 70–99)
GLUCOSE-CAPILLARY: 159 mg/dL — AB (ref 70–99)
GLUCOSE-CAPILLARY: 182 mg/dL — AB (ref 70–99)
GLUCOSE-CAPILLARY: 205 mg/dL — AB (ref 70–99)
GLUCOSE-CAPILLARY: 245 mg/dL — AB (ref 70–99)
GLUCOSE-CAPILLARY: 121 mg/dL — AB (ref 70–99)
GLUCOSE-CAPILLARY: 89 mg/dL (ref 70–99)
Glucose-Capillary: 109 mg/dL — ABNORMAL HIGH (ref 70–99)
Glucose-Capillary: 110 mg/dL — ABNORMAL HIGH (ref 70–99)
Glucose-Capillary: 120 mg/dL — ABNORMAL HIGH (ref 70–99)
Glucose-Capillary: 138 mg/dL — ABNORMAL HIGH (ref 70–99)
Glucose-Capillary: 140 mg/dL — ABNORMAL HIGH (ref 70–99)
Glucose-Capillary: 145 mg/dL — ABNORMAL HIGH (ref 70–99)
Glucose-Capillary: 169 mg/dL — ABNORMAL HIGH (ref 70–99)
Glucose-Capillary: 170 mg/dL — ABNORMAL HIGH (ref 70–99)
Glucose-Capillary: 186 mg/dL — ABNORMAL HIGH (ref 70–99)
Glucose-Capillary: 213 mg/dL — ABNORMAL HIGH (ref 70–99)
Glucose-Capillary: 269 mg/dL — ABNORMAL HIGH (ref 70–99)
Glucose-Capillary: 88 mg/dL (ref 70–99)

## 2013-11-20 LAB — BASIC METABOLIC PANEL
ANION GAP: 14 (ref 5–15)
Anion gap: 18 — ABNORMAL HIGH (ref 5–15)
Anion gap: 19 — ABNORMAL HIGH (ref 5–15)
Anion gap: 19 — ABNORMAL HIGH (ref 5–15)
Anion gap: 18 — ABNORMAL HIGH (ref 5–15)
BUN: 70 mg/dL — ABNORMAL HIGH (ref 6–23)
BUN: 70 mg/dL — ABNORMAL HIGH (ref 6–23)
BUN: 70 mg/dL — ABNORMAL HIGH (ref 6–23)
BUN: 70 mg/dL — ABNORMAL HIGH (ref 6–23)
BUN: 62 mg/dL — AB (ref 6–23)
CHLORIDE: 110 meq/L (ref 96–112)
CO2: 13 mEq/L — ABNORMAL LOW (ref 19–32)
CO2: 12 mEq/L — ABNORMAL LOW (ref 19–32)
CO2: 13 mEq/L — ABNORMAL LOW (ref 19–32)
CO2: 13 mEq/L — ABNORMAL LOW (ref 19–32)
CO2: 21 mEq/L (ref 19–32)
Calcium: 7.6 mg/dL — ABNORMAL LOW (ref 8.4–10.5)
Calcium: 7.4 mg/dL — ABNORMAL LOW (ref 8.4–10.5)
Calcium: 7.8 mg/dL — ABNORMAL LOW (ref 8.4–10.5)
Calcium: 7.8 mg/dL — ABNORMAL LOW (ref 8.4–10.5)
Calcium: 7.5 mg/dL — ABNORMAL LOW (ref 8.4–10.5)
Chloride: 119 mEq/L — ABNORMAL HIGH (ref 96–112)
Chloride: 121 mEq/L — ABNORMAL HIGH (ref 96–112)
Chloride: 119 mEq/L — ABNORMAL HIGH (ref 96–112)
Chloride: 120 mEq/L — ABNORMAL HIGH (ref 96–112)
Creatinine, Ser: 4.66 mg/dL — ABNORMAL HIGH (ref 0.50–1.35)
Creatinine, Ser: 4.62 mg/dL — ABNORMAL HIGH (ref 0.50–1.35)
Creatinine, Ser: 4.56 mg/dL — ABNORMAL HIGH (ref 0.50–1.35)
Creatinine, Ser: 4.53 mg/dL — ABNORMAL HIGH (ref 0.50–1.35)
Creatinine, Ser: 3.89 mg/dL — ABNORMAL HIGH (ref 0.50–1.35)
GFR calc Af Amer: 12 mL/min — ABNORMAL LOW (ref >90)
GFR calc Af Amer: 13 mL/min — ABNORMAL LOW (ref >90)
GFR calc Af Amer: 13 mL/min — ABNORMAL LOW (ref >90)
GFR calc Af Amer: 13 mL/min — ABNORMAL LOW (ref >90)
GFR calc non Af Amer: 11 mL/min — ABNORMAL LOW (ref >90)
GFR calc non Af Amer: 11 mL/min — ABNORMAL LOW (ref >90)
GFR calc non Af Amer: 11 mL/min — ABNORMAL LOW (ref >90)
GFR calc non Af Amer: 11 mL/min — ABNORMAL LOW (ref >90)
GFR calc non Af Amer: 13 mL/min — ABNORMAL LOW (ref >90)
GFR, EST AFRICAN AMERICAN: 15 mL/min — AB (ref >90)
Glucose, Bld: 108 mg/dL — ABNORMAL HIGH (ref 70–99)
Glucose, Bld: 119 mg/dL — ABNORMAL HIGH (ref 70–99)
Glucose, Bld: 169 mg/dL — ABNORMAL HIGH (ref 70–99)
Glucose, Bld: 191 mg/dL — ABNORMAL HIGH (ref 70–99)
Glucose, Bld: 347 mg/dL — ABNORMAL HIGH (ref 70–99)
POTASSIUM: 3.4 meq/L — AB (ref 3.7–5.3)
Potassium: 4.2 mEq/L (ref 3.7–5.3)
Potassium: 4.4 mEq/L (ref 3.7–5.3)
Potassium: 4.6 mEq/L (ref 3.7–5.3)
Potassium: 4.4 mEq/L (ref 3.7–5.3)
Sodium: 150 mEq/L — ABNORMAL HIGH (ref 137–147)
Sodium: 152 mEq/L — ABNORMAL HIGH (ref 137–147)
Sodium: 151 mEq/L — ABNORMAL HIGH (ref 137–147)
Sodium: 151 mEq/L — ABNORMAL HIGH (ref 137–147)
Sodium: 145 mEq/L (ref 137–147)

## 2013-11-20 LAB — HEPATIC FUNCTION PANEL
ALT: 12 U/L (ref 0–53)
AST: 20 U/L (ref 0–37)
Albumin: 2 g/dL — ABNORMAL LOW (ref 3.5–5.2)
Alkaline Phosphatase: 52 U/L (ref 39–117)
BILIRUBIN TOTAL: 0.2 mg/dL — AB (ref 0.3–1.2)
Bilirubin, Direct: <0.2 mg/dL (ref 0.0–0.3)
Total Protein: 5.6 g/dL — ABNORMAL LOW (ref 6.0–8.3)

## 2013-11-20 LAB — URINE CULTURE
Colony Count: NO GROWTH
Culture: NO GROWTH

## 2013-11-20 LAB — CBC WITH DIFFERENTIAL/PLATELET
BASOS ABS: 0 10*3/uL (ref 0.0–0.1)
BASOS PCT: 0 % (ref 0–1)
Eosinophils Absolute: 0 10*3/uL (ref 0.0–0.7)
Eosinophils Relative: 0 % (ref 0–5)
HEMATOCRIT: 29 % — AB (ref 39.0–52.0)
HEMOGLOBIN: 9.7 g/dL — AB (ref 13.0–17.0)
LYMPHS PCT: 5 % — AB (ref 12–46)
Lymphs Abs: 0.8 10*3/uL (ref 0.7–4.0)
MCH: 29.5 pg (ref 26.0–34.0)
MCHC: 33.4 g/dL (ref 30.0–36.0)
MCV: 88.1 fL (ref 78.0–100.0)
MONOS PCT: 4 % (ref 3–12)
Monocytes Absolute: 0.7 10*3/uL (ref 0.1–1.0)
Neutro Abs: 15.3 10*3/uL — ABNORMAL HIGH (ref 1.7–7.7)
Neutrophils Relative %: 91 % — ABNORMAL HIGH (ref 43–77)
Platelets: 247 10*3/uL (ref 150–400)
RBC: 3.29 MIL/uL — ABNORMAL LOW (ref 4.22–5.81)
RDW: 14.5 % (ref 11.5–15.5)
WBC: 16.8 10*3/uL — AB (ref 4.0–10.5)

## 2013-11-20 LAB — PHOSPHORUS: Phosphorus: 4.9 mg/dL — ABNORMAL HIGH (ref 2.3–4.6)

## 2013-11-20 LAB — PROTIME-INR
INR: 1.34 (ref 0.00–1.49)
Prothrombin Time: 16.8 seconds — ABNORMAL HIGH (ref 11.6–15.2)

## 2013-11-20 LAB — MAGNESIUM: Magnesium: 1.5 mg/dL (ref 1.5–2.5)

## 2013-11-20 LAB — TROPONIN I: Troponin I: 0.31 ng/mL (ref <0.30)

## 2013-11-20 LAB — LACTIC ACID, PLASMA: Lactic Acid, Venous: 3.5 mmol/L — ABNORMAL HIGH (ref 0.5–2.2)

## 2013-11-20 MED ORDER — LEVETIRACETAM IN NACL 500 MG/100ML IV SOLN
500.0000 mg | INTRAVENOUS | Status: DC
  Administered 2013-11-20 – 2013-11-24 (×5): 500 mg via INTRAVENOUS
  Filled 2013-11-20 (×6): qty 100

## 2013-11-20 MED ORDER — SODIUM BICARBONATE 8.4 % IV SOLN
INTRAVENOUS | Status: DC
  Administered 2013-11-20 – 2013-11-21 (×4): via INTRAVENOUS
  Filled 2013-11-20 (×9): qty 1000

## 2013-11-20 MED ORDER — METOPROLOL TARTRATE 1 MG/ML IV SOLN
2.5000 mg | Freq: Three times a day (TID) | INTRAVENOUS | Status: DC
  Administered 2013-11-20: 2.5 mg via INTRAVENOUS
  Filled 2013-11-20 (×2): qty 5

## 2013-11-20 MED ORDER — BUDESONIDE 0.25 MG/2ML IN SUSP
0.2500 mg | Freq: Two times a day (BID) | RESPIRATORY_TRACT | Status: DC
  Administered 2013-11-20 – 2013-11-25 (×10): 0.25 mg via RESPIRATORY_TRACT
  Filled 2013-11-20 (×11): qty 2

## 2013-11-20 NOTE — Progress Notes (Addendum)
Inpatient Diabetes Program Recommendations  AACE/ADA: New Consensus Statement on Inpatient Glycemic Control (2013)  Target Ranges:  Prepandial:   less than 140 mg/dL      Peak postprandial:   less than 180 mg/dL (1-2 hours)      Critically ill patients:  140 - 180 mg/dL     Results for [name] Walker, [name] Walker (MRN [id number]) as of 11/20/2013 09:27  Ref. Range 11/20/2013 06:30  Sodium Latest Range: 137-147 mEq/L 151 (H)  Potassium Latest Range: 3.7-5.3 mEq/L 4.6  Chloride Latest Range: 96-112 mEq/L 119 (H)  CO2 Latest Range: 19-32 mEq/L 13 (L)  BUN Latest Range: 6-23 mg/dL 70 (H)  Creatinine Latest Range: 0.50-1.35 mg/dL 4.094.56 (H)  Calcium Latest Range: 8.4-10.5 mg/dL 7.8 (L)  GFR calc non Af Amer Latest Range: >90 mL/min 11 (L)  GFR calc Af Amer Latest Range: >90 mL/min 13 (L)  Glucose Latest Range: 70-99 mg/dL 811169 (H)  Anion gap Latest Range: 5-15  19 (H)     Patient started on IV insulin drip yesterday at 3:45am.  CBGs stabilized quickly and IV insulin drip rate has been zero since 9:45am yesterday AM (based on GlucoStabilizer).  Patient still showing low CO2 and elevated Anion Gap.  MD, could this be due to patient's renal function and lactic acidosis instead of DKA at this point?  Patient still receiving IVF of D5 1/2 NS at 100 cc/hour.    MD- When patient ready to transition off IV insulin drip, please make sure patient receives basal insulin 1-2 hours before discontinuation of IV insulin drip.   Per records, patient takes Levemir 45 units bid at SNF.  Could start with 50% of this home dose and titrate as needed.    Will follow Ambrose FinlandJeannine Johnston Latona Krichbaum RN, MSN, CDE Diabetes Coordinator Inpatient Diabetes Program Team Pager: [phone] (8a-10p)

## 2013-11-20 NOTE — Progress Notes (Signed)
Clinical Social Work Department BRIEF PSYCHOSOCIAL ASSESSMENT 11/20/2013  Patient:  [name] Walker,[name] Walker     Account Number:  [id number]     Admit date:  11/19/2013  Clinical Social Worker:  Jacelyn GripBYRD,SUZANNA, LCSWA  Date/Time:  11/20/2013 04:00 PM  Referred by:  Physician  Date Referred:  11/20/2013 Referred for  SNF Placement   Other Referral:   Interview type:  Family Other interview type:    PSYCHOSOCIAL DATA Living Status:  FACILITY Admitted from facility:  [facility] Level of care:  Skilled Nursing Facility Primary support name:  [name] Walker/[phone] Primary support relationship to patient:  CHILD, ADULT Degree of support available:   strong    CURRENT CONCERNS Current Concerns  Post-Acute Placement   Other Concerns:    SOCIAL WORK ASSESSMENT / PLAN CSW received referral that pt admitted from [facility].    CSW visited pt at bedside. Pt oriented to person only. Pt was able to tell this CSW that he has a son and a daughter. Pt was unable to engage with CSW about being a resident at [facility]. Pt did thank CSW for visiting the room. No family present at this time.    CSW contacted pt son, [name] Walker via telephone. CSW introduced self and explained role. Pt son confirmed that pt is a [facility] and plan is for pt to return to facility when medically stable for discharge. Pt son has been pleased with pt care at facility.    CSW contacted [facility] and confirmed with facility that pt could return when medically stable.    CSW completed FL2 and sent clinical[facility] to [facility].    CSW to continue to follow to provide support and assist with pt discharge planning needs.   Assessment/plan status:  Psychosocial Support/Ongoing Assessment of Needs Other assessment/ plan:   discharge planning   Information/referral to community resources:   Referral back to [facility]     PATIENT'S/FAMILY'S RESPONSE TO PLAN OF CARE: Pt alert and oriented to person only. Pt pleasant, but unable to provide history regarding SNF placement. Pt son agreeable to pt return to [facility] when pt medically stable. Pt son appreciative of CSW phone call and assistance with pt return to [facility] once pt medically stable for discharge.    Loletta SpecterSuzanna Kidd, MSW, LCSW Clinical Social Work [phone]

## 2013-11-20 NOTE — Progress Notes (Signed)
[facility] PROGRESS NOTE  [name] ZOX:[id number] DOB: January 02, 1932 DOA: 11/19/2013 PCP: No primary provider on file.  Assessment/Plan: 1. DKA type 2: with uncontrolled diabetes and previous admissions for DKA -A. Gap was still around 18; and Bicarb 13 -continue insulin drip -given concerns of acidosis from renal failure, will add bicarb drip -continue IVF's and follow response  2-Sepsis: due to PNA as seen on CXR; with or without contribution from skin infection (decubius ulcers) -continue zosyn and follow response -will add pulmicort -continue as possible flutter valve -PRN oxygen supplementation  3-lactic acidosis: due to metformin, sepsis from PNA and renal failure -continue IVF's  4-acute on chronic renal failure: at baseline stage 4. Worsening -metformin discontinue -avoid/minimize nephrotoxic agents -follow renal function and urine cx -continue IVF's  5-dementia: oriented X1 -answer simple question, but with difficulties following commands  6-demand ischemia: troponin elevated -no ischemic changes on EKG -most likely from renal failure' patient denies CP -troponin neg  -continue IVF's and supportive care.   Code Status: DNR Family Communication: no family at bedside Disposition Plan: to be determine    Consultants:  PCCM  Procedures:  See below for x-ray reports  Antibiotics:  Zosyn 10/20  HPI/Subjective: Confused, afebrile; denies CP and SOB. Patient is hungry.  Objective: Filed Vitals:   11/20/13 1915  BP: 140/114  Pulse: 94  Temp: 98.2 F (36.8 C)  Resp: 22    Intake/Output Summary (Last 24 hours) at 11/20/13 1959 Last data filed at 11/20/13 1956  Gross per 24 hour  Intake 3099.22 ml  Output      0 ml  Net 3099.22 ml   Filed Weights   11/19/13 0323  Weight: 65 kg (143 lb 4.8 oz)    Exam:   General:  Confused; reports been hungry, no CP, no fever  Cardiovascular: S1 and S2, no rubs or gallops  Respiratory:  scattered rhonchi, no crackles, mild end exp wheezing appreciated  Abdomen: soft, NT, ND, positive BS  Musculoskeletal: no edema, no cyanosis  Data Reviewed: Basic Metabolic Panel:  Recent Labs Lab 11/19/13 0419 11/19/13 0503  11/19/13 2245 11/20/13 0006 11/20/13 0156 11/20/13 0630 11/20/13 1009  NA 149* 149*  < > 151* 150* 152* 151* 151*  K 4.4 3.3*  < > 4.5 4.2 4.4 4.6 4.4  CL 115* 122*  < > 120* 119* 121* 119* 120*  CO2 11* 10*  < > 14* 13* 12* 13* 13*  GLUCOSE 418* 284*  < > 112* 108* 119* 169* 191*  BUN 74* 60*  < > 71* 70* 70* 70* 70*  CREATININE 4.24* 3.41*  < > 4.77* 4.66* 4.62* 4.56* 4.53*  CALCIUM 8.2* 6.2*  < > 7.8* 7.6* 7.4* 7.8* 7.8*  MG  --  1.3*  --   --   --   --  1.5  --   PHOS  --  3.0  --   --   --   --  4.9*  --   < > = values in this interval not displayed. Liver Function Tests:  Recent Labs Lab 11/19/13 0120 11/20/13 0630  AST 20 20  ALT 9 12  ALKPHOS 61 52  BILITOT 0.3 0.2*  PROT 6.5 5.6*  ALBUMIN 2.4* 2.0*   CBC:  Recent Labs Lab 11/19/13 0116 11/19/13 0120 11/19/13 0503 11/19/13 0725 11/20/13 0630  WBC  --  18.3* 13.2* 14.6* 16.8*  NEUTROABS  --  16.5* 11.8*  --  15.3*  HGB 11.9* 11.5* 8.0* 9.2* 9.7*  HCT 35.0* 34.0* 23.5* 27.1* 29.0*  MCV  --  89.0 89.7 88.0 88.1  PLT  --  313 PLATELET CLUMPS NOTED ON SMEAR, COUNT APPEARS ADEQUATE 243 247   Cardiac Enzymes:  Recent Labs Lab 11/19/13 0503 11/19/13 1108 11/19/13 1542 11/19/13 2245  TROPONINI <0.30 <0.30 0.35* 0.31*   CBG:  Recent Labs Lab 11/20/13 1425 11/20/13 1534 11/20/13 1635 11/20/13 1736 11/20/13 1837  GLUCAP 269* 245* 170* 145* 121*    Recent Results (from the past 240 hour(s))  URINE CULTURE     Status: None   Collection Time    11/19/13  1:09 AM      Result Value Ref Range Status   Specimen Description URINE, RANDOM   Final   Special Requests NONE   Final   Culture  Setup Time     Final   Value: 11/19/2013 08:12     [facility]formed [facility]   Colony Count     Final   Value: NO GROWTH     [facility]formed at [facility]   Culture     Final   Value: NO GROWTH     Performed at [facility]   Report Status 11/20/2013 FINAL   Final  CULTURE, BLOOD (ROUTINE X 2)     Status: None   Collection Time    11/19/13  1:20 AM      Result Value Ref Range Status   Specimen Description BLOOD BLOOD LEFT FOREARM   Final   Special Requests BOTTLES DRAWN AEROBIC AND ANAEROBIC 5CC   Final   Culture  Setup Time     Final   Value: 11/19/2013 04:21     Performed at [facility]   Culture     Final   Value:        BLOOD CULTURE RECEIVED NO GROWTH TO DATE CULTURE WILL BE HELD FOR 5 DAYS BEFORE ISSUING A FINAL NEGATIVE REPORT     Performed at [facility]   Report Status PENDING   Incomplete  CULTURE, BLOOD (ROUTINE X 2)     Status: None   Collection Time    11/19/13  1:20 AM      Result Value Ref Range Status   Specimen Description BLOOD BLOOD RIGHT FOREARM   Final   Special Requests BOTTLES DRAWN AEROBIC AND ANAEROBIC 1CC   Final   Culture  Setup Time     Final   Value: 11/19/2013 04:20     Performed at [facility]   Culture     Final   Value:        BLOOD CULTURE RECEIVED NO GROWTH TO DATE CULTURE WILL BE HELD FOR 5 DAYS BEFORE ISSUING A FINAL NEGATIVE REPORT     Performed at [facility]   Report Status PENDING   Incomplete  MRSA PCR SCREENING     Status: Abnormal   Collection Time    11/19/13  6:30 AM      Result Value Ref Range Status   MRSA by PCR POSITIVE (*) NEGATIVE Final   Comment:            The GeneXpert MRSA Assay (FDA     approved for NASAL specimens     only), is one component of a     comprehensive MRSA colonization     surveillance program. It is not     intended to diagnose MRSA     infection nor to guide or  monitor treatment for     MRSA infections.     RESULT CALLED TO, READ BACK BY AND VERIFIED WITH:     [name] HonourJ. CONRAD RN 11:10 11/19/13 ([email or web address])      [facility]     Studies: Dg Chest 2 View  11/19/2013   CLINICAL DATA:  Hyperglycemia. Fever for 48 hr. Diagnosed with pneumonia on 10/18. Increased weakness and lethargy.  EXAM: CHEST  2 VIEW  COMPARISON:  02/23/2013  FINDINGS: Normal heart size and pulmonary vascularity. Shallow inspiration. Infiltration or atelectasis in the left lung base may be compatible with pneumonia. No blunting of costophrenic angles. No pneumothorax. Tortuous and ectatic aorta.  IMPRESSION: Infiltration or atelectasis in the left lung base may be compatible with pneumonia.   Electronically Signed   By: Burman NievesWilliam  Stevens M.D.   On: 11/19/2013 01:42    Scheduled Meds: . antiseptic oral rinse  7 mL Mouth Rinse q12n4p  . budesonide (PULMICORT) nebulizer solution  0.25 mg Nebulization BID  . chlorhexidine  15 mL Mouth Rinse BID  . clopidogrel  75 mg Oral Q breakfast  . divalproex  375 mg Oral QHS  . donepezil  10 mg Oral QHS  . escitalopram  20 mg Oral q morning - 10a  . heparin subcutaneous  5,000 Units Subcutaneous Q12H  . ipratropium-albuterol  3 mL Nebulization Q6H  . levETIRAcetam  500 mg Intravenous Q24H  . metoprolol  2.5 mg Intravenous 3 times per day  . montelukast  10 mg Oral q morning - 10a  . pantoprazole (PROTONIX) IV  40 mg Intravenous Q24H  . piperacillin-tazobactam (ZOSYN)  IV  2.25 g Intravenous 4 times per day   Continuous Infusions: . dextrose 5 % 1,000 mL with sodium bicarbonate 100 mEq infusion 100 mL/hr at 11/20/13 1234  . insulin (NOVOLIN-R) infusion 0.5 Units/hr (11/20/13 1939)    Principal Problem:   Sepsis Active Problems:   UTI (lower urinary tract infection)   Acute renal failure   Lactic acidosis   Severe sepsis with acute organ dysfunction   Acute encephalopathy    Time spent: 30 minutes     Vassie LollMadera, [name] Walker  [facility] Pager [phone]. If 7PM-7AM, please contact night-coverage at [email or web address], password [email or web address] 11/20/2013, 7:59 PM  LOS: 1 day

## 2013-11-20 NOTE — Progress Notes (Addendum)
CRITICAL VALUE ALERT  Critical value received:  Troponin- 0.31  Date of notification:  11/20/13  Time of notification: 12:22 AM  Critical value read back:Yes.    Nurse who received alert:  L. Hilary Hertzeinert, RN  MD notified (1st page):  [facility]  Time of first page:  12:23 AM  Hospitalist aware

## 2013-11-20 NOTE — Progress Notes (Signed)
ANTIBIOTIC CONSULT NOTE - FOLLOW UP  Pharmacy Consult for Vancomycin/Zosyn Indication: sepsis  No Known Allergies  Patient Measurements: Height: 5\' 7"  (170.2 cm) Weight: 143 lb 4.8 oz (65 kg) IBW/kg (Calculated) : 66.1  Vital Signs: Temp: 98.1 F (36.7 C) (10/21 1200) Temp Source: Axillary (10/21 1200) BP: 131/82 mmHg (10/21 0842) Pulse Rate: 85 (10/21 0842) Intake/Output from previous day: 10/20 0701 - 10/21 0700 In: 2440 [I.V.:2190; IV Piggyback:250] Out: -  Intake/Output from this shift:    Labs:  Recent Labs  11/19/13 0503 11/19/13 0725  11/20/13 0156 11/20/13 0630 11/20/13 1009  WBC 13.2* 14.6*  --   --  16.8*  --   HGB 8.0* 9.2*  --   --  9.7*  --   PLT PLATELET CLUMPS NOTED ON SMEAR, COUNT APPEARS ADEQUATE 243  --   --  247  --   CREATININE 3.41* 4.37*  < > 4.62* 4.56* 4.53*  < > = values in this interval not displayed. Estimated Creatinine Clearance: 12 ml/min (by C-G formula based on Cr of 4.53). No results found for this basename: VANCOTROUGH, VANCOPEAK, VANCORANDOM, GENTTROUGH, GENTPEAK, GENTRANDOM, TOBRATROUGH, TOBRAPEAK, TOBRARND, AMIKACINPEAK, AMIKACINTROU, AMIKACIN,  in the last 72 hours    Assessment: [age] yoM from [facility] presented with severe sepsis, FTT with recent back excoriation due to MRSA and MDR Klebsiella sensitive only to Bactrim per MD note. Was on Rx with cephelaxin and bactrim through approx 11/10/13. Recently diagnosed with left sided PNA and given Rocephin on 10/19. PNA on CXR 10/20.  Pharmacy consulted to dose vancomycin and Zosyn for sepsis.  10/20 >> vanc >> 10/20 >> zosyn >>  Tmax: AF  WBC: elevated, increased Renal: AKI, SCr remaining elevated at 4.53, CrCl~11 ml/min (CG) Lactic acid: 7.02 > 5.13 > 3.5  10/20 MRSA screen: positive 10/20 urine cx: NGF 10/20 blood x 2: ngtd  Today is day #2 Vancomycin (dose based on levels) and Zosyn 2.25gm IV q6hr for sepsis 2/2 HCAP and possibly UTI, high risk for MDR organisms.    Goal of  Therapy:  Vancomycin trough level 15-20 mcg/ml Doses adjusted per renal function Eradication of infection  Plan:  1.  Check random vancomycin level early 10/22, 48h after first dose of 1g given (~15 mg/kg x1). 2.  No adjustment to Zosyn dosing. 3.  F/u culture results, SCr.  Mattie Marlinunyon, Peri Kreft 11/20/2013,2:50 PM

## 2013-11-20 NOTE — Progress Notes (Signed)
Called hospitalist to ask about giving bicarb to close the anion gap. Hospitalist is changing q2h BMET to Q4H, but wants to hold bicarb until the rounding physician has seen him. Will continue to monitor.

## 2013-11-21 DIAGNOSIS — J189 Pneumonia, unspecified organism: Secondary | ICD-10-CM

## 2013-11-21 LAB — BASIC METABOLIC PANEL
ANION GAP: 16 — AB (ref 5–15)
ANION GAP: 16 — AB (ref 5–15)
ANION GAP: 17 — AB (ref 5–15)
ANION GAP: 18 — AB (ref 5–15)
Anion gap: 21 — ABNORMAL HIGH (ref 5–15)
Anion gap: 16 — ABNORMAL HIGH (ref 5–15)
BUN: 59 mg/dL — ABNORMAL HIGH (ref 6–23)
BUN: 64 mg/dL — ABNORMAL HIGH (ref 6–23)
BUN: 62 mg/dL — ABNORMAL HIGH (ref 6–23)
BUN: 55 mg/dL — ABNORMAL HIGH (ref 6–23)
BUN: 61 mg/dL — AB (ref 6–23)
BUN: 59 mg/dL — ABNORMAL HIGH (ref 6–23)
CHLORIDE: 107 meq/L (ref 96–112)
CHLORIDE: 119 meq/L — AB (ref 96–112)
CHLORIDE: 116 meq/L — AB (ref 96–112)
CO2: 22 mEq/L (ref 19–32)
CO2: 18 mEq/L — ABNORMAL LOW (ref 19–32)
CO2: 19 mEq/L (ref 19–32)
CO2: 22 mEq/L (ref 19–32)
CO2: 18 mEq/L — ABNORMAL LOW (ref 19–32)
CO2: 21 meq/L (ref 19–32)
CREATININE: 3.77 mg/dL — AB (ref 0.50–1.35)
CREATININE: 3.53 mg/dL — AB (ref 0.50–1.35)
Calcium: 7.1 mg/dL — ABNORMAL LOW (ref 8.4–10.5)
Calcium: 8 mg/dL — ABNORMAL LOW (ref 8.4–10.5)
Calcium: 7.9 mg/dL — ABNORMAL LOW (ref 8.4–10.5)
Calcium: 7.1 mg/dL — ABNORMAL LOW (ref 8.4–10.5)
Calcium: 8.1 mg/dL — ABNORMAL LOW (ref 8.4–10.5)
Calcium: 7.9 mg/dL — ABNORMAL LOW (ref 8.4–10.5)
Chloride: 114 mEq/L — ABNORMAL HIGH (ref 96–112)
Chloride: 116 mEq/L — ABNORMAL HIGH (ref 96–112)
Chloride: 104 mEq/L (ref 96–112)
Creatinine, Ser: 3.61 mg/dL — ABNORMAL HIGH (ref 0.50–1.35)
Creatinine, Ser: 3.9 mg/dL — ABNORMAL HIGH (ref 0.50–1.35)
Creatinine, Ser: 3.32 mg/dL — ABNORMAL HIGH (ref 0.50–1.35)
Creatinine, Ser: 3.35 mg/dL — ABNORMAL HIGH (ref 0.50–1.35)
GFR calc Af Amer: 19 mL/min — ABNORMAL LOW (ref >90)
GFR calc Af Amer: 17 mL/min — ABNORMAL LOW (ref >90)
GFR calc Af Amer: 19 mL/min — ABNORMAL LOW (ref >90)
GFR calc non Af Amer: 15 mL/min — ABNORMAL LOW (ref >90)
GFR calc non Af Amer: 13 mL/min — ABNORMAL LOW (ref >90)
GFR, EST AFRICAN AMERICAN: 17 mL/min — AB (ref >90)
GFR, EST AFRICAN AMERICAN: 15 mL/min — AB (ref >90)
GFR, EST AFRICAN AMERICAN: 16 mL/min — AB (ref >90)
GFR, EST NON AFRICAN AMERICAN: 14 mL/min — AB (ref >90)
GFR, EST NON AFRICAN AMERICAN: 16 mL/min — AB (ref >90)
GFR, EST NON AFRICAN AMERICAN: 15 mL/min — AB (ref >90)
GFR, EST NON AFRICAN AMERICAN: 16 mL/min — AB (ref >90)
GLUCOSE: 398 mg/dL — AB (ref 70–99)
GLUCOSE: 178 mg/dL — AB (ref 70–99)
Glucose, Bld: 582 mg/dL (ref 70–99)
Glucose, Bld: 257 mg/dL — ABNORMAL HIGH (ref 70–99)
Glucose, Bld: 205 mg/dL — ABNORMAL HIGH (ref 70–99)
Glucose, Bld: 95 mg/dL (ref 70–99)
POTASSIUM: 3.7 meq/L (ref 3.7–5.3)
POTASSIUM: 3.3 meq/L — AB (ref 3.7–5.3)
POTASSIUM: 3 meq/L — AB (ref 3.7–5.3)
POTASSIUM: 4 meq/L (ref 3.7–5.3)
POTASSIUM: 3.8 meq/L (ref 3.7–5.3)
Potassium: 3.6 mEq/L — ABNORMAL LOW (ref 3.7–5.3)
SODIUM: 145 meq/L (ref 137–147)
SODIUM: 148 meq/L — AB (ref 137–147)
SODIUM: 147 meq/L (ref 137–147)
SODIUM: 153 meq/L — AB (ref 137–147)
Sodium: 152 mEq/L — ABNORMAL HIGH (ref 137–147)
Sodium: 155 mEq/L — ABNORMAL HIGH (ref 137–147)

## 2013-11-21 LAB — GLUCOSE, CAPILLARY
GLUCOSE-CAPILLARY: 118 mg/dL — AB (ref 70–99)
GLUCOSE-CAPILLARY: 153 mg/dL — AB (ref 70–99)
GLUCOSE-CAPILLARY: 179 mg/dL — AB (ref 70–99)
GLUCOSE-CAPILLARY: 214 mg/dL — AB (ref 70–99)
GLUCOSE-CAPILLARY: 236 mg/dL — AB (ref 70–99)
Glucose-Capillary: 234 mg/dL — ABNORMAL HIGH (ref 70–99)
Glucose-Capillary: 121 mg/dL — ABNORMAL HIGH (ref 70–99)
Glucose-Capillary: 125 mg/dL — ABNORMAL HIGH (ref 70–99)
Glucose-Capillary: 117 mg/dL — ABNORMAL HIGH (ref 70–99)
Glucose-Capillary: 141 mg/dL — ABNORMAL HIGH (ref 70–99)
Glucose-Capillary: 178 mg/dL — ABNORMAL HIGH (ref 70–99)
Glucose-Capillary: 244 mg/dL — ABNORMAL HIGH (ref 70–99)
Glucose-Capillary: 260 mg/dL — ABNORMAL HIGH (ref 70–99)
Glucose-Capillary: 275 mg/dL — ABNORMAL HIGH (ref 70–99)
Glucose-Capillary: 238 mg/dL — ABNORMAL HIGH (ref 70–99)
Glucose-Capillary: 134 mg/dL — ABNORMAL HIGH (ref 70–99)
Glucose-Capillary: 89 mg/dL (ref 70–99)
Glucose-Capillary: 103 mg/dL — ABNORMAL HIGH (ref 70–99)
Glucose-Capillary: 124 mg/dL — ABNORMAL HIGH (ref 70–99)
Glucose-Capillary: 167 mg/dL — ABNORMAL HIGH (ref 70–99)
Glucose-Capillary: 206 mg/dL — ABNORMAL HIGH (ref 70–99)
Glucose-Capillary: 225 mg/dL — ABNORMAL HIGH (ref 70–99)

## 2013-11-21 LAB — CBC WITH DIFFERENTIAL/PLATELET
Basophils Absolute: 0 10*3/uL (ref 0.0–0.1)
Basophils Relative: 0 % (ref 0–1)
Eosinophils Absolute: 0.1 10*3/uL (ref 0.0–0.7)
Eosinophils Relative: 1 % (ref 0–5)
HCT: 28.1 % — ABNORMAL LOW (ref 39.0–52.0)
Hemoglobin: 9.3 g/dL — ABNORMAL LOW (ref 13.0–17.0)
Lymphocytes Relative: 7 % — ABNORMAL LOW (ref 12–46)
Lymphs Abs: 0.8 10*3/uL (ref 0.7–4.0)
MCH: 29.1 pg (ref 26.0–34.0)
MCHC: 33.1 g/dL (ref 30.0–36.0)
MCV: 87.8 fL (ref 78.0–100.0)
MONO ABS: 0.4 10*3/uL (ref 0.1–1.0)
Monocytes Relative: 3 % (ref 3–12)
NEUTROS PCT: 89 % — AB (ref 43–77)
Neutro Abs: 10.6 10*3/uL — ABNORMAL HIGH (ref 1.7–7.7)
Platelets: 225 10*3/uL (ref 150–400)
RBC: 3.2 MIL/uL — AB (ref 4.22–5.81)
RDW: 14.2 % (ref 11.5–15.5)
WBC: 11.9 10*3/uL — ABNORMAL HIGH (ref 4.0–10.5)

## 2013-11-21 LAB — MAGNESIUM: Magnesium: 1.6 mg/dL (ref 1.5–2.5)

## 2013-11-21 LAB — VANCOMYCIN, RANDOM: Vancomycin Rm: 6.7 ug/mL

## 2013-11-21 LAB — PHOSPHORUS: Phosphorus: 4.2 mg/dL (ref 2.3–4.6)

## 2013-11-21 MED ORDER — CHLORHEXIDINE GLUCONATE CLOTH 2 % EX PADS
6.0000 | MEDICATED_PAD | Freq: Every day | CUTANEOUS | Status: AC
  Administered 2013-11-21 – 2013-11-25 (×5): 6 via TOPICAL

## 2013-11-21 MED ORDER — SODIUM CHLORIDE 0.9 % IV SOLN
INTRAVENOUS | Status: DC
  Administered 2013-11-21: 07:00:00 via INTRAVENOUS

## 2013-11-21 MED ORDER — VANCOMYCIN HCL IN DEXTROSE 1-5 GM/200ML-% IV SOLN
1000.0000 mg | Freq: Once | INTRAVENOUS | Status: AC
  Administered 2013-11-21: 1000 mg via INTRAVENOUS
  Filled 2013-11-21: qty 200

## 2013-11-21 MED ORDER — DILTIAZEM HCL 100 MG IV SOLR
5.0000 mg/h | INTRAVENOUS | Status: DC
  Administered 2013-11-21: 5 mg/h via INTRAVENOUS
  Filled 2013-11-21: qty 100

## 2013-11-21 MED ORDER — METOPROLOL TARTRATE 1 MG/ML IV SOLN
5.0000 mg | Freq: Four times a day (QID) | INTRAVENOUS | Status: DC
  Administered 2013-11-21 – 2013-11-24 (×13): 5 mg via INTRAVENOUS
  Filled 2013-11-21 (×20): qty 5

## 2013-11-21 MED ORDER — MUPIROCIN 2 % EX OINT
1.0000 "application " | TOPICAL_OINTMENT | Freq: Two times a day (BID) | CUTANEOUS | Status: DC
  Administered 2013-11-21 – 2013-11-25 (×9): 1 via NASAL
  Filled 2013-11-21 (×3): qty 22

## 2013-11-21 MED ORDER — METOPROLOL TARTRATE 1 MG/ML IV SOLN
5.0000 mg | Freq: Three times a day (TID) | INTRAVENOUS | Status: DC
  Administered 2013-11-21: 5 mg via INTRAVENOUS

## 2013-11-21 MED ORDER — SODIUM CHLORIDE 0.45 % IV SOLN: INTRAVENOUS | Status: DC

## 2013-11-21 MED ORDER — SODIUM BICARBONATE 8.4 % IV SOLN
INTRAVENOUS | Status: DC
  Administered 2013-11-21 – 2013-11-22 (×5): via INTRAVENOUS
  Filled 2013-11-21 (×10): qty 830

## 2013-11-21 NOTE — Progress Notes (Signed)
[facility] PROGRESS NOTE  [name] Walker ZOX:[id number] DOB: [birth year] DOA: 11/19/2013 PCP: No primary provider on file.  Assessment/Plan: 1. DKA type 2: with uncontrolled diabetes and previous admissions for DKA -A. Gap was still 17; and Bicarb 18 (despite bicarb drip) -continue insulin drip (will violate protocol and continue at 0.6 units; CBG's q2h) -given concerns of acidosis from renal failure, will continue bicarb on IV -continue IVF's and follow response -hopefully able to transition off insulin drip soon and advance diet  2-Sepsis: due to PNA as seen on CXR; with or without contribution from skin infection (decubitus ulcers) -continue zosyn and follow response -will continue pulmicort -continue as possible flutter valve -PRN oxygen supplementation  3-lactic acidosis: due to metformin, sepsis from PNA and renal failure -continue IVF's  4-acute on chronic renal failure: at baseline stage 4. Slightly improved -metformin discontinue -avoid/minimize nephrotoxic agents -follow renal function and urine cx results -continue IVF's  5-dementia: oriented X2 today -answer simple question and was able to follow commands -most likely close to basleine  6-demand ischemia: troponin elevated -no ischemic changes on EKG -most likely from renal failure' patient denies CP -troponin neg  -continue IVF's and supportive care.   Code Status: DNR Family Communication: no family at bedside Disposition Plan: to be determine    Consultants:  PCCM  Procedures:  See below for x-ray reports  Antibiotics:  Zosyn 10/20  HPI/Subjective: Improved mentation and able to follow commands; afebrile; denies CP and SOB. Patient is hungry. Took all his meds today  Objective: Filed Vitals:   11/21/13 1630  BP:   Pulse:   Temp: 98 F (36.7 C)  Resp:     Intake/Output Summary (Last 24 hours) at 11/21/13 1848 Last data filed at 11/21/13 1300  Gross per 24 hour  Intake  2555.96 ml  Output      4 ml  Net 2551.96 ml   Filed Weights   11/19/13 0323  Weight: 65 kg (143 lb 4.8 oz)    Exam:   General:  Mentation improved; reports been hungry, no CP, no fever; able to follow commands and today took all his meds  Cardiovascular: S1 and S2, no rubs or gallops  Respiratory: scattered rhonchi, no crackles, mild end exp wheezing appreciated  Abdomen: soft, NT, ND, positive BS  Musculoskeletal: no edema, no cyanosis  Data Reviewed: Basic Metabolic Panel:  Recent Labs Lab 11/19/13 0419 11/19/13 0503  11/20/13 0630  11/20/13 2300 11/21/13 0130 11/21/13 0327 11/21/13 0612 11/21/13 1543  NA 149* 149*  < > 151*  < > 145 148* 152* 147 155*  K 4.4 3.3*  < > 4.6  < > 3.6* 3.7 3.3* 3.0* 4.0  CL 115* 122*  < > 119*  < > 107 114* 116* 104 119*  CO2 11* 10*  < > 13*  < > 22 18* 19 22 18*  GLUCOSE 418* 284*  < > 169*  < > 582* 257* 205* 398* 95  BUN 74* 60*  < > 70*  < > 59* 64* 62* 55* 61*  CREATININE 4.24* 3.41*  < > 4.56*  < > 3.61* 3.90* 3.77* 3.32* 3.53*  CALCIUM 8.2* 6.2*  < > 7.8*  < > 7.1* 8.0* 7.9* 7.1* 8.1*  MG  --  1.3*  --  1.5  --   --   --  1.6  --   --   PHOS  --  3.0  --  4.9*  --   --   --  4.2  --   --   < > = values in this interval not displayed. Liver Function Tests:  Recent Labs Lab 11/19/13 0120 11/20/13 0630  AST 20 20  ALT 9 12  ALKPHOS 61 52  BILITOT 0.3 0.2*  PROT 6.5 5.6*  ALBUMIN 2.4* 2.0*   CBC:  Recent Labs Lab 11/19/13 0120 11/19/13 0503 11/19/13 0725 11/20/13 0630 11/21/13 0327  WBC 18.3* 13.2* 14.6* 16.8* 11.9*  NEUTROABS 16.5* 11.8*  --  15.3* 10.6*  HGB 11.5* 8.0* 9.2* 9.7* 9.3*  HCT 34.0* 23.5* 27.1* 29.0* 28.1*  MCV 89.0 89.7 88.0 88.1 87.8  PLT 313 PLATELET CLUMPS NOTED ON SMEAR, COUNT APPEARS ADEQUATE 243 247 225   Cardiac Enzymes:  Recent Labs Lab 11/19/13 0503 11/19/13 1108 11/19/13 1542 11/19/13 2245  TROPONINI <0.30 <0.30 0.35* 0.31*   CBG:  Recent Labs Lab 11/21/13 1256  11/21/13 1403 11/21/13 1507 11/21/13 1611 11/21/13 1714  GLUCAP 238* 134* 89 103* 124*    Recent Results (from the past 240 hour(s))  URINE CULTURE     Status: None   Collection Time    11/19/13  1:09 AM      Result Value Ref Range Status   Specimen Description URINE, RANDOM   Final   Special Requests NONE   Final   Culture  Setup Time     Final   Value: 11/19/2013 08:12     Performed at [facility]   Colony Count     Final   Value: NO GROWTH     [facility]formed at [facility]   Culture     Final   Value: NO GROWTH     Performed at [facility]   Report Status 11/20/2013 FINAL   Final  CULTURE, BLOOD (ROUTINE X 2)     Status: None   Collection Time    11/19/13  1:20 AM      Result Value Ref Range Status   Specimen Description BLOOD BLOOD LEFT FOREARM   Final   Special Requests BOTTLES DRAWN AEROBIC AND ANAEROBIC 5CC   Final   Culture  Setup Time     Final   Value: 11/19/2013 04:21     Performed at [facility]   Culture     Final   Value:        BLOOD CULTURE RECEIVED NO GROWTH TO DATE CULTURE WILL BE HELD FOR 5 DAYS BEFORE ISSUING A FINAL NEGATIVE REPORT     Performed at [facility]   Report Status PENDING   Incomplete  CULTURE, BLOOD (ROUTINE X 2)     Status: None   Collection Time    11/19/13  1:20 AM      Result Value Ref Range Status   Specimen Description BLOOD BLOOD RIGHT FOREARM   Final   Special Requests BOTTLES DRAWN AEROBIC AND ANAEROBIC 1CC   Final   Culture  Setup Time     Final   Value: 11/19/2013 04:20     Performed at [facility]   Culture     Final   Value:        BLOOD CULTURE RECEIVED NO GROWTH TO DATE CULTURE WILL BE HELD FOR 5 DAYS BEFORE ISSUING A FINAL NEGATIVE REPORT     Performed at [facility]   Report Status PENDING   Incomplete  MRSA PCR SCREENING     Status: Abnormal   Collection Time    11/19/13  6:30 AM  Result Value Ref Range Status   MRSA by PCR POSITIVE (*) NEGATIVE  Final   Comment:            The GeneXpert MRSA Assay (FDA     approved for NASAL specimens     only), is one component of a     comprehensive MRSA colonization     surveillance program. It is not     intended to diagnose MRSA     infection nor to guide or     monitor treatment for     MRSA infections.     RESULT CALLED TO, READ BACK BY AND VERIFIED WITH:     Wardell HonourJ. CONRAD RN 11:10 11/19/13 ([email or web address])     [facility]     Studies: No results found.  Scheduled Meds: . antiseptic oral rinse  7 mL Mouth Rinse q12n4p  . budesonide (PULMICORT) nebulizer solution  0.25 mg Nebulization BID  . chlorhexidine  15 mL Mouth Rinse BID  . Chlorhexidine Gluconate Cloth  6 each Topical Q0600  . clopidogrel  75 mg Oral Q breakfast  . divalproex  375 mg Oral QHS  . donepezil  10 mg Oral QHS  . escitalopram  20 mg Oral q morning - 10a  . heparin subcutaneous  5,000 Units Subcutaneous Q12H  . ipratropium-albuterol  3 mL Nebulization Q6H  . levETIRAcetam  500 mg Intravenous Q24H  . metoprolol  5 mg Intravenous 4 times per day  . montelukast  10 mg Oral q morning - 10a  . mupirocin ointment  1 application Nasal BID  . pantoprazole (PROTONIX) IV  40 mg Intravenous Q24H  . piperacillin-tazobactam (ZOSYN)  IV  2.25 g Intravenous 4 times per day   Continuous Infusions: . dextrose 5 % 1,000 mL with sodium bicarbonate 100 mEq infusion 100 mL/hr at 11/21/13 1513  . insulin (NOVOLIN-R) infusion 0.6 Units/hr (11/21/13 1706)  . sterile water 830 mL with potassium chloride 40 mEq, sodium bicarbonate 150 mEq infusion Stopped (11/21/13 1514)    Principal Problem:   Sepsis Active Problems:   UTI (lower urinary tract infection)   Acute renal failure   Lactic acidosis   Severe sepsis with acute organ dysfunction   Acute encephalopathy    Time spent: 30 minutes     Vassie LollMadera, Chayne Baumgart  [facility] Pager [phone]. If 7PM-7AM, please contact night-coverage at [email or web address],  password [facility] 11/21/2013, 6:48 PM  LOS: 2 days

## 2013-11-21 NOTE — Progress Notes (Signed)
Pt with HR in 140;s. md  Called and stated to give 5 mg metoprolol now. Will continue to monitor.

## 2013-11-21 NOTE — Progress Notes (Signed)
Pt with frequent stools. Pt with HR still in 140's. Called md awaiting call back. Will place pt on cdiff precautions. Will send specimen when available.

## 2013-11-22 DIAGNOSIS — J449 Chronic obstructive pulmonary disease, unspecified: Secondary | ICD-10-CM

## 2013-11-22 LAB — CBC WITH DIFFERENTIAL/PLATELET
Basophils Absolute: 0 10*3/uL (ref 0.0–0.1)
Basophils Relative: 0 % (ref 0–1)
Eosinophils Absolute: 0.1 10*3/uL (ref 0.0–0.7)
Eosinophils Relative: 2 % (ref 0–5)
HEMATOCRIT: 28.5 % — AB (ref 39.0–52.0)
HEMOGLOBIN: 9.7 g/dL — AB (ref 13.0–17.0)
LYMPHS PCT: 12 % (ref 12–46)
Lymphs Abs: 0.9 10*3/uL (ref 0.7–4.0)
MCH: 29.8 pg (ref 26.0–34.0)
MCHC: 34 g/dL (ref 30.0–36.0)
MCV: 87.4 fL (ref 78.0–100.0)
MONO ABS: 0.3 10*3/uL (ref 0.1–1.0)
MONOS PCT: 4 % (ref 3–12)
NEUTROS PCT: 82 % — AB (ref 43–77)
Neutro Abs: 5.8 10*3/uL (ref 1.7–7.7)
Platelets: 226 10*3/uL (ref 150–400)
RBC: 3.26 MIL/uL — AB (ref 4.22–5.81)
RDW: 14.3 % (ref 11.5–15.5)
WBC: 7.1 10*3/uL (ref 4.0–10.5)

## 2013-11-22 LAB — GLUCOSE, CAPILLARY
GLUCOSE-CAPILLARY: 252 mg/dL — AB (ref 70–99)
GLUCOSE-CAPILLARY: 262 mg/dL — AB (ref 70–99)
GLUCOSE-CAPILLARY: 219 mg/dL — AB (ref 70–99)
GLUCOSE-CAPILLARY: 227 mg/dL — AB (ref 70–99)
GLUCOSE-CAPILLARY: 197 mg/dL — AB (ref 70–99)
GLUCOSE-CAPILLARY: 269 mg/dL — AB (ref 70–99)
Glucose-Capillary: 252 mg/dL — ABNORMAL HIGH (ref 70–99)
Glucose-Capillary: 129 mg/dL — ABNORMAL HIGH (ref 70–99)
Glucose-Capillary: 134 mg/dL — ABNORMAL HIGH (ref 70–99)

## 2013-11-22 LAB — BASIC METABOLIC PANEL
ANION GAP: 14 (ref 5–15)
Anion gap: 15 (ref 5–15)
Anion gap: 17 — ABNORMAL HIGH (ref 5–15)
Anion gap: 17 — ABNORMAL HIGH (ref 5–15)
BUN: 55 mg/dL — ABNORMAL HIGH (ref 6–23)
BUN: 54 mg/dL — ABNORMAL HIGH (ref 6–23)
BUN: 52 mg/dL — ABNORMAL HIGH (ref 6–23)
BUN: 45 mg/dL — ABNORMAL HIGH (ref 6–23)
CALCIUM: 7.8 mg/dL — AB (ref 8.4–10.5)
CHLORIDE: 112 meq/L (ref 96–112)
CHLORIDE: 114 meq/L — AB (ref 96–112)
CO2: 21 mEq/L (ref 19–32)
CO2: 21 mEq/L (ref 19–32)
CO2: 23 meq/L (ref 19–32)
CO2: 27 meq/L (ref 19–32)
CREATININE: 2.96 mg/dL — AB (ref 0.50–1.35)
CREATININE: 2.67 mg/dL — AB (ref 0.50–1.35)
Calcium: 7.7 mg/dL — ABNORMAL LOW (ref 8.4–10.5)
Calcium: 7.9 mg/dL — ABNORMAL LOW (ref 8.4–10.5)
Calcium: 7.9 mg/dL — ABNORMAL LOW (ref 8.4–10.5)
Chloride: 110 mEq/L (ref 96–112)
Chloride: 112 mEq/L (ref 96–112)
Creatinine, Ser: 3.05 mg/dL — ABNORMAL HIGH (ref 0.50–1.35)
Creatinine, Ser: 3.08 mg/dL — ABNORMAL HIGH (ref 0.50–1.35)
GFR calc Af Amer: 21 mL/min — ABNORMAL LOW (ref >90)
GFR calc Af Amer: 20 mL/min — ABNORMAL LOW (ref >90)
GFR calc Af Amer: 22 mL/min — ABNORMAL LOW (ref >90)
GFR calc Af Amer: 24 mL/min — ABNORMAL LOW (ref >90)
GFR, EST NON AFRICAN AMERICAN: 18 mL/min — AB (ref >90)
GFR, EST NON AFRICAN AMERICAN: 18 mL/min — AB (ref >90)
GFR, EST NON AFRICAN AMERICAN: 19 mL/min — AB (ref >90)
GFR, EST NON AFRICAN AMERICAN: 21 mL/min — AB (ref >90)
GLUCOSE: 258 mg/dL — AB (ref 70–99)
GLUCOSE: 255 mg/dL — AB (ref 70–99)
GLUCOSE: 142 mg/dL — AB (ref 70–99)
Glucose, Bld: 271 mg/dL — ABNORMAL HIGH (ref 70–99)
POTASSIUM: 5.6 meq/L — AB (ref 3.7–5.3)
POTASSIUM: 3.4 meq/L — AB (ref 3.7–5.3)
Potassium: 3.1 mEq/L — ABNORMAL LOW (ref 3.7–5.3)
Potassium: 3.2 mEq/L — ABNORMAL LOW (ref 3.7–5.3)
SODIUM: 148 meq/L — AB (ref 137–147)
SODIUM: 152 meq/L — AB (ref 137–147)
Sodium: 150 mEq/L — ABNORMAL HIGH (ref 137–147)
Sodium: 153 mEq/L — ABNORMAL HIGH (ref 137–147)

## 2013-11-22 LAB — PHOSPHORUS: PHOSPHORUS: 3.9 mg/dL (ref 2.3–4.6)

## 2013-11-22 LAB — MAGNESIUM: MAGNESIUM: 1.5 mg/dL (ref 1.5–2.5)

## 2013-11-22 LAB — CLOSTRIDIUM DIFFICILE BY PCR: Toxigenic C. Difficile by PCR: NEGATIVE

## 2013-11-22 MED ORDER — INSULIN ASPART 100 UNIT/ML ~~LOC~~ SOLN
0.0000 [IU] | Freq: Three times a day (TID) | SUBCUTANEOUS | Status: DC
  Administered 2013-11-22: 2 [IU] via SUBCUTANEOUS
  Administered 2013-11-22: 3 [IU] via SUBCUTANEOUS

## 2013-11-22 MED ORDER — INSULIN DETEMIR 100 UNIT/ML ~~LOC~~ SOLN
25.0000 [IU] | Freq: Two times a day (BID) | SUBCUTANEOUS | Status: DC
  Administered 2013-11-22 (×2): 25 [IU] via SUBCUTANEOUS
  Filled 2013-11-22 (×4): qty 0.25

## 2013-11-22 MED ORDER — LINEZOLID 2 MG/ML IV SOLN
600.0000 mg | Freq: Two times a day (BID) | INTRAVENOUS | Status: DC
  Administered 2013-11-22: 600 mg via INTRAVENOUS
  Filled 2013-11-22 (×3): qty 300

## 2013-11-22 MED ORDER — LINEZOLID 2 MG/ML IV SOLN
600.0000 mg | Freq: Two times a day (BID) | INTRAVENOUS | Status: DC
  Administered 2013-11-23 – 2013-11-25 (×6): 600 mg via INTRAVENOUS
  Filled 2013-11-22 (×6): qty 300

## 2013-11-22 MED ORDER — RESOURCE THICKENUP CLEAR PO POWD
ORAL | Status: DC | PRN
Start: 2013-11-22 — End: 2013-11-25
  Filled 2013-11-22: qty 125

## 2013-11-22 MED ORDER — PIPERACILLIN-TAZOBACTAM IN DEX 2-0.25 GM/50ML IV SOLN
2.2500 g | Freq: Four times a day (QID) | INTRAVENOUS | Status: DC
  Administered 2013-11-22 – 2013-11-23 (×2): 2.25 g via INTRAVENOUS
  Filled 2013-11-22 (×3): qty 50

## 2013-11-22 MED ORDER — CAMPHOR-MENTHOL 0.5-0.5 % EX LOTN
1.0000 "application " | TOPICAL_LOTION | Freq: Two times a day (BID) | CUTANEOUS | Status: DC
  Administered 2013-11-22 – 2013-11-25 (×7): 1 via TOPICAL
  Filled 2013-11-22: qty 222

## 2013-11-22 NOTE — Progress Notes (Signed)
[facility] PROGRESS NOTE  TUAN TIPPIN ZOX:[id number] DOB: 1931/03/01 DOA: 11/19/2013 PCP: No primary provider on file.  Assessment/Plan: 1. DKA type 2: with uncontrolled diabetes and previous admissions for DKA -A. Gap was still 15-17; and Bicarb 21; CBG's around 200 range -concern for starvation ketosis contributing to slight elevation of his gap; now that Bicarb is WNL and renal function has improved; will transition off insulin drip and follow electrolytes and CBG's -continue bicarb and IVF's -will advance diet to carb modified  2-Sepsis: due to PNA as seen on CXR; with or without contribution from skin infection (decubitus ulcers) -continue zosyn and zyvox now (given renal failure), discussed with ID -follow response -will continue pulmicort -continue as possible flutter valve -PRN oxygen supplementation  3-lactic acidosis: due to metformin, sepsis from PNA and renal failure -continue IVF's -recheck lactic acid in am -bicarb 21 and acidosis pretty much resolved now  4-acute on chronic renal failure: at baseline stage 4. Improved -metformin discontinue -avoid/minimize nephrotoxic agents -follow renal function  -vancomycin discontinued -continue IVF's -Cr 2.9 on 10/23  5-dementia/toxic encephalopathy: oriented X2 today -answer simple question and was able to follow commands -most likely close to baseline -will monitor -imagine mentation worse due to toxic encephalopathy -continue abx's therapy  6-demand ischemia: troponin slightly elevated -no ischemic changes on EKG -most likely from renal failure' patient denies CP -continue IVF's and supportive care.  -continue B-blocker  7-dysphagia: concerns for aspiration -will ask Speech therapy to evaluate -meanwhile will use dysphagia 2 diet  Code Status: DNR Family Communication: no family at bedside Disposition Plan: to be determine    Consultants:  PCCM  ID curbside (for zyvox authorization; dr.  Ninetta Lights)  Procedures:  See below for x-ray reports  Antibiotics:  Zosyn 10/20  zyvox 10/23  HPI/Subjective: No CP, no SOB and no fever; patien denies abd pain and is hungry  Objective: Filed Vitals:   11/22/13 0900  BP: 129/81  Pulse: 70  Temp:   Resp:     Intake/Output Summary (Last 24 hours) at 11/22/13 0956 Last data filed at 11/22/13 0600  Gross per 24 hour  Intake 2602.65 ml  Output      0 ml  Net 2602.65 ml   Filed Weights   11/19/13 0323  Weight: 65 kg (143 lb 4.8 oz)    Exam:   General:  Mentation continue to improve; no CP, no fever; able to follow commands and denies SOB or abd pain  Cardiovascular: S1 and S2, no rubs or gallops  Respiratory: scattered rhonchi, no crackles, slight exp wheezing appreciated  Abdomen: soft, NT, ND, positive BS  Musculoskeletal: no edema, no cyanosis  Data Reviewed: Basic Metabolic Panel:  Recent Labs Lab 11/19/13 0419 11/19/13 0503  11/20/13 0630  11/21/13 0327  11/21/13 1543 11/21/13 1846 11/21/13 2246 11/22/13 0305 11/22/13 0711  NA 149* 149*  < > 151*  < > 152*  < > 155* 153* 148* 152* 150*  K 4.4 3.3*  < > 4.6  < > 3.3*  < > 4.0 3.8 5.6* 3.4* 3.1*  CL 115* 122*  < > 119*  < > 116*  < > 119* 116* 112 114* 110  CO2 11* 10*  < > 13*  < > 19  < > 18* 21 21 21 23   GLUCOSE 418* 284*  < > 169*  < > 205*  < > 95 178* 258* 255* 271*  BUN 74* 60*  < > 70*  < > 62*  < >  61* 59* 55* 54* 52*  CREATININE 4.24* 3.41*  < > 4.56*  < > 3.77*  < > 3.53* 3.35* 3.05* 3.08* 2.96*  CALCIUM 8.2* 6.2*  < > 7.8*  < > 7.9*  < > 8.1* 7.9* 7.7* 7.9* 7.9*  MG  --  1.3*  --  1.5  --  1.6  --   --   --   --  1.5  --   PHOS  --  3.0  --  4.9*  --  4.2  --   --   --   --  3.9  --   < > = values in this interval not displayed. Liver Function Tests:  Recent Labs Lab 11/19/13 0120 11/20/13 0630  AST 20 20  ALT 9 12  ALKPHOS 61 52  BILITOT 0.3 0.2*  PROT 6.5 5.6*  ALBUMIN 2.4* 2.0*   CBC:  Recent Labs Lab 11/19/13 0120  11/19/13 0503 11/19/13 0725 11/20/13 0630 11/21/13 0327 11/22/13 0305  WBC 18.3* 13.2* 14.6* 16.8* 11.9* 7.1  NEUTROABS 16.5* 11.8*  --  15.3* 10.6* 5.8  HGB 11.5* 8.0* 9.2* 9.7* 9.3* 9.7*  HCT 34.0* 23.5* 27.1* 29.0* 28.1* 28.5*  MCV 89.0 89.7 88.0 88.1 87.8 87.4  PLT 313 PLATELET CLUMPS NOTED ON SMEAR, COUNT APPEARS ADEQUATE 243 247 225 226   Cardiac Enzymes:  Recent Labs Lab 11/19/13 0503 11/19/13 1108 11/19/13 1542 11/19/13 2245  TROPONINI <0.30 <0.30 0.35* 0.31*   CBG:  Recent Labs Lab 11/21/13 2256 11/22/13 0111 11/22/13 0251 11/22/13 0508 11/22/13 0642  GLUCAP 225* 252* 262* 219* 227*    Recent Results (from the past 240 hour(s))  URINE CULTURE     Status: None   Collection Time    11/19/13  1:09 AM      Result Value Ref Range Status   Specimen Description URINE, RANDOM   Final   Special Requests NONE   Final   Culture  Setup Time     Final   Value: 11/19/2013 08:12     Performed at [facility]   Colony Count     Final   Value: NO GROWTH     [facility]formed at [facility]   Culture     Final   Value: NO GROWTH     Performed at [facility]   Report Status 11/20/2013 FINAL   Final  CULTURE, BLOOD (ROUTINE X 2)     Status: None   Collection Time    11/19/13  1:20 AM      Result Value Ref Range Status   Specimen Description BLOOD BLOOD LEFT FOREARM   Final   Special Requests BOTTLES DRAWN AEROBIC AND ANAEROBIC 5CC   Final   Culture  Setup Time     Final   Value: 11/19/2013 04:21     Performed at [facility]   Culture     Final   Value:        BLOOD CULTURE RECEIVED NO GROWTH TO DATE CULTURE WILL BE HELD FOR 5 DAYS BEFORE ISSUING A FINAL NEGATIVE REPORT     Performed at [facility]   Report Status PENDING   Incomplete  CULTURE, BLOOD (ROUTINE X 2)     Status: None   Collection Time    11/19/13  1:20 AM      Result Value Ref Range Status   Specimen Description BLOOD BLOOD RIGHT FOREARM   Final   Special  Requests BOTTLES DRAWN AEROBIC AND ANAEROBIC  1CC   Final   Culture  Setup Time     Final   Value: 11/19/2013 04:20     Performed at [facility]   Culture     Final   Value:        BLOOD CULTURE RECEIVED NO GROWTH TO DATE CULTURE WILL BE HELD FOR 5 DAYS BEFORE ISSUING A FINAL NEGATIVE REPORT     [facility]formed at [facility]   Report Status PENDING   Incomplete  MRSA PCR SCREENING     Status: Abnormal   Collection Time    11/19/13  6:30 AM      Result Value Ref Range Status   MRSA by PCR POSITIVE (*) NEGATIVE Final   Comment:            The GeneXpert MRSA Assay (FDA     approved for NASAL specimens     only), is one component of a     comprehensive MRSA colonization     surveillance program. It is not     intended to diagnose MRSA     infection nor to guide or     monitor treatment for     MRSA infections.     RESULT CALLED TO, READ BACK BY AND VERIFIED WITH:     Wardell HonourJ. CONRAD RN 11:10 11/19/13 ([email or web address])     [facility]  CLOSTRIDIUM DIFFICILE BY PCR     Status: None   Collection Time    11/21/13  5:05 PM      Result Value Ref Range Status   C difficile by pcr NEGATIVE  NEGATIVE Final   Comment: [facility]formed at [facility]     Studies: No results found.  Scheduled Meds: . antiseptic oral rinse  7 mL Mouth Rinse q12n4p  . budesonide (PULMICORT) nebulizer solution  0.25 mg Nebulization BID  . chlorhexidine  15 mL Mouth Rinse BID  . Chlorhexidine Gluconate Cloth  6 each Topical Q0600  . clopidogrel  75 mg Oral Q breakfast  . divalproex  375 mg Oral QHS  . donepezil  10 mg Oral QHS  . escitalopram  20 mg Oral q morning - 10a  . heparin subcutaneous  5,000 Units Subcutaneous Q12H  . insulin detemir  25 Units Subcutaneous BID  . ipratropium-albuterol  3 mL Nebulization Q6H  . levETIRAcetam  500 mg Intravenous Q24H  . linezolid  600 mg Intravenous Q12H  . metoprolol  5 mg Intravenous 4 times per day  . montelukast  10 mg Oral q morning -  10a  . mupirocin ointment  1 application Nasal BID  . pantoprazole (PROTONIX) IV  40 mg Intravenous Q24H  . piperacillin-tazobactam (ZOSYN)  IV  2.25 g Intravenous 4 times per day   Continuous Infusions: . insulin (NOVOLIN-R) infusion 0.6 Units/hr (11/21/13 1815)  . sterile water 830 mL with potassium chloride 40 mEq, sodium bicarbonate 150 mEq infusion 100 mL/hr at 11/22/13 [id number]    Principal Problem:   Sepsis Active Problems:   UTI (lower urinary tract infection)   Acute renal failure   Lactic acidosis   Severe sepsis with acute organ dysfunction   Acute encephalopathy    Time spent: 30 minutes     Vassie LollMadera, Yechiel Erny  [facility] Pager [phone]. If 7PM-7AM, please contact night-coverage at [email or web address], password [email or web address] 11/22/2013, 9:56 AM  LOS: 3 days

## 2013-11-22 NOTE — Progress Notes (Signed)
CSW continuing to follow.  [facility] and plans is for pt to return upon discharge.  CSW noted that pt transferred to 1426.   [facility]  CSW to continue to follow and facilitate pt discharge needs when pt medically stable for discharge.  Loletta SpecterSuzanna Katlen Seyer, MSW, LCSW Clinical Social Work [phone]

## 2013-11-22 NOTE — Progress Notes (Addendum)
Inpatient Diabetes Program Recommendations  AACE/ADA: New Consensus Statement on Inpatient Glycemic Control (2013)  Target Ranges:  Prepandial:   less than 140 mg/dL      Peak postprandial:   less than 180 mg/dL (1-2 hours)      Critically ill patients:  140 - 180 mg/dL     Results for [name] Walker, [name] Walker (MRN [id number]) as of 11/22/2013 08:41  Ref. Range 11/22/2013 07:11  Sodium Latest Range: 137-147 mEq/L 150 (H)  Potassium Latest Range: 3.7-5.3 mEq/L 3.1 (L)  Chloride Latest Range: 96-112 mEq/L 110  CO2 Latest Range: 19-32 mEq/L 23  BUN Latest Range: 6-23 mg/dL 52 (H)  Creatinine Latest Range: 0.50-1.35 mg/dL 9.142.96 (H)  Calcium Latest Range: 8.4-10.5 mg/dL 7.9 (L)  GFR calc non Af Amer Latest Range: >90 mL/min 19 (L)  GFR calc Af Amer Latest Range: >90 mL/min 22 (L)  Glucose Latest Range: 70-99 mg/dL 782271 (H)  Anion gap Latest Range: 5-15  17 (H)     **Patient remains on IV insulin drip.  Discontinued GlucoStablizer and maintaining patient on set dose of 0.6 units/hour on IV insulin drip.  CBGs being checked Q2 hours.  **CO2 has improved to 23.  Anion gap still elevated, however, question if this is likely due to patient's renal function??    MD- When patient ready to transition off IV insulin drip, please make sure patient receives Levemir 1-2 hours before IV insulin is stopped.    Per records, patient takes large amount of insulin at SNF: Levemir 45 units bid + Novolog 25 units tid with meals.  Would start with at least 50% of patient's home dose of Levemir when drip is stopped- Levemir 22 units bid     Will follow Ambrose FinlandJeannine Johnston Madissen Wyse RN, MSN, CDE Diabetes Coordinator Inpatient Diabetes Program Team Pager: [phone] (8a-10p)

## 2013-11-22 NOTE — Evaluation (Signed)
Clinical/Bedside Swallow Evaluation Patient Details  Name: [name] Walker MRN: [id number] Date of Birth: 04/27/1931  Today's Date: 11/22/2013 Time: [id number] SLP Time Calculation (min): 37 min  Past Medical History:  Past Medical History  Diagnosis Date  . Diabetes mellitus   . Hypertension   . Hyperlipidemia   . Headache(784.0)   . Seizure   . Arthritis   . Anemia   . Blind left eye   . Subdural hematoma     hx recurrent right subdural hematoma  . Coronary artery disease    Past Surgical History:  Past Surgical History  Procedure Laterality Date  . Appendectomy      AGE 21  . Rhino-septoplasty  1976  . Knee arthroscopy  1981    left knee  . Retinal detachment surgery  1989    left  . Cataract extraction  1995    right eye  . Knee arthroscopy  1998    right knee  . Heart catherization  2001  . Knee surgery  2003    revision of left knee  . Penile prosthesis implant  2005  . Shoulder arthroscopy  2005, 2006    right shoulder  . Knee surgery  1998    left knee lateral release  . Replacement total knee bilateral     HPI:  80 [facility].  Pt resides in memory unit and has h/o dementia, basal ganglia and high right frontal cva, SDH, hiatal hernia, presbyesophagus and stricture diagnosed and dilated in 2008.  CXR showed ? pna and left lung base.  RN reported pt coughing with intake and swallow evaluation ordered by MD.     Assessment / Plan / Recommendation Clinical Impression  Pt presents with suspected sensorimotor oropharyngeal dysphagia due to h/o dementia and cvas.   Overt coughing noted immediately after swallow of water via cup/straw with poor pt awareness.  Suspect premature spillage of liquids into pharynx of liquids and cracker particles with resultant aspiration and/or penetration.  Much improved tolerance of moistened soft foods and nectar thick liquids.    There are no indications of significant residuals nor pt complaint of esophageal  symptoms.  Do not suspect pt's primary dysphagia currently is due to known h/o esophageal deficits.    Educated pt to findings, recommendations and he was agreeable to plan - however given dementia, he may not retain information.  Recommend modify diet currenlty to maximize pt's comfort with intake.  SLP to follow up for readiness for dietary advancement.      Aspiration Risk  Mild (with diet modification)    Diet Recommendation Dysphagia 3 (Mechanical Soft);Nectar-thick liquid   Liquid Administration via: Cup;Straw Medication Administration: Whole meds with puree Supervision: Patient able to self feed;Full supervision/cueing for compensatory strategies Compensations: Slow rate;Small sips/bites Postural Changes and/or Swallow Maneuvers: Seated upright 90 degrees;Upright 30-60 min after meal    Other  Recommendations Oral Care Recommendations: Oral care BID   Follow Up Recommendations    memory unit follow up   Frequency and Duration min 2x/week  2 weeks   Pertinent Vitals/Pain Low grade fever, decreased     Swallow Study Prior Functional Status   on regular/thin (carb mod) at SNF    General Date of Onset: 11/22/13 HPI: 80 [facility].  Pt resides in memory unit and has h/o dementia, basal ganglia and high right frontal cva, SDH, hiatal hernia, presbyesophagus and stricture diagnosed and dilated in 2008.  CXR  showed ? pna and left lung base.  RN reported pt coughing with intake and swallow evaluation ordered by MD.   Type of Study: Bedside swallow evaluation Diet Prior to this Study: Regular;Thin liquids (carb mod) Temperature Spikes Noted: Yes (low grade) Respiratory Status: Room air History of Recent Intubation: No Behavior/Cognition: Alert;Cooperative;Decreased sustained attention;Requires cueing (delayed direction following due to cognitive deficit) Oral Cavity - Dentition: Dentures, bottom;Dentures, top Self-Feeding Abilities: Able to feed self;Needs  assist Patient Positioning: Upright in bed Baseline Vocal Quality: Clear Volitional Cough: Strong Volitional Swallow: Able to elicit    Oral/Motor/Sensory Function Overall Oral Motor/Sensory Function:  (generalized weakness, no focal cn deficits )   Ice Chips Ice chips: Impaired Presentation: Spoon Oral Phase Impairments: Reduced lingual movement/coordination;Impaired anterior to posterior transit Oral Phase Functional Implications: Prolonged oral transit Pharyngeal Phase Impairments: Cough - Immediate   Thin Liquid Thin Liquid: Impaired Presentation: Cup;Straw;Spoon Oral Phase Impairments: Reduced lingual movement/coordination Pharyngeal  Phase Impairments: Cough - Immediate    Nectar Thick Nectar Thick Liquid: Within functional limits Presentation: Cup;Straw;Self Fed   Honey Thick Honey Thick Liquid: Not tested   Puree Puree: Within functional limits Presentation: Self Fed;Spoon   Solid   GO    Solid: Impaired Presentation: Self Fed Oral Phase Impairments: Reduced lingual movement/coordination;Impaired anterior to posterior transit Oral Phase Functional Implications: Other (comment) (slow mastication) Pharyngeal Phase Impairments: Cough - Delayed       [name], MS [facility] SLP [phone]

## 2013-11-22 NOTE — Progress Notes (Signed)
Full report given to 4E RN Marissa. VS and pt stable for transport.

## 2013-11-23 DIAGNOSIS — E43 Unspecified severe protein-calorie malnutrition: Secondary | ICD-10-CM

## 2013-11-23 DIAGNOSIS — E11649 Type 2 diabetes mellitus with hypoglycemia without coma: Secondary | ICD-10-CM

## 2013-11-23 LAB — BASIC METABOLIC PANEL
ANION GAP: 13 (ref 5–15)
Anion gap: 11 (ref 5–15)
BUN: 37 mg/dL — ABNORMAL HIGH (ref 6–23)
BUN: 30 mg/dL — AB (ref 6–23)
CHLORIDE: 109 meq/L (ref 96–112)
CO2: 29 mEq/L (ref 19–32)
CO2: 30 meq/L (ref 19–32)
CREATININE: 2.35 mg/dL — AB (ref 0.50–1.35)
Calcium: 7.9 mg/dL — ABNORMAL LOW (ref 8.4–10.5)
Calcium: 7.4 mg/dL — ABNORMAL LOW (ref 8.4–10.5)
Chloride: 112 mEq/L (ref 96–112)
Creatinine, Ser: 2.43 mg/dL — ABNORMAL HIGH (ref 0.50–1.35)
GFR calc Af Amer: 28 mL/min — ABNORMAL LOW (ref >90)
GFR calc non Af Amer: 24 mL/min — ABNORMAL LOW (ref >90)
GFR calc non Af Amer: 25 mL/min — ABNORMAL LOW (ref >90)
GFR, EST AFRICAN AMERICAN: 27 mL/min — AB (ref >90)
Glucose, Bld: 22 mg/dL — CL (ref 70–99)
Glucose, Bld: 77 mg/dL (ref 70–99)
POTASSIUM: 2.5 meq/L — AB (ref 3.7–5.3)
Potassium: 3 mEq/L — ABNORMAL LOW (ref 3.7–5.3)
Sodium: 154 mEq/L — ABNORMAL HIGH (ref 137–147)
Sodium: 150 mEq/L — ABNORMAL HIGH (ref 137–147)

## 2013-11-23 LAB — GLUCOSE, CAPILLARY
GLUCOSE-CAPILLARY: 90 mg/dL (ref 70–99)
GLUCOSE-CAPILLARY: 79 mg/dL (ref 70–99)
Glucose-Capillary: 145 mg/dL — ABNORMAL HIGH (ref 70–99)
Glucose-Capillary: 83 mg/dL (ref 70–99)
Glucose-Capillary: 31 mg/dL — CL (ref 70–99)
Glucose-Capillary: 54 mg/dL — ABNORMAL LOW (ref 70–99)
Glucose-Capillary: 104 mg/dL — ABNORMAL HIGH (ref 70–99)

## 2013-11-23 MED ORDER — POTASSIUM CHLORIDE 10 MEQ/100ML IV SOLN
10.0000 meq | INTRAVENOUS | Status: AC
  Administered 2013-11-23 (×4): 10 meq via INTRAVENOUS
  Filled 2013-11-23 (×4): qty 100

## 2013-11-23 MED ORDER — GLUCERNA SHAKE PO LIQD
237.0000 mL | Freq: Two times a day (BID) | ORAL | Status: DC
  Administered 2013-11-23 – 2013-11-25 (×4): 237 mL via ORAL
  Filled 2013-11-23 (×5): qty 237

## 2013-11-23 MED ORDER — SODIUM BICARBONATE 650 MG PO TABS
650.0000 mg | ORAL_TABLET | Freq: Every day | ORAL | Status: DC
  Administered 2013-11-23 – 2013-11-25 (×3): 650 mg via ORAL
  Filled 2013-11-23 (×3): qty 1

## 2013-11-23 MED ORDER — DEXTROSE 50 % IV SOLN
INTRAVENOUS | Status: AC
  Administered 2013-11-23: 25 mL
  Filled 2013-11-23: qty 50

## 2013-11-23 MED ORDER — DEXTROSE 50 % IV SOLN
INTRAVENOUS | Status: AC
  Administered 2013-11-23: 50 mL via INTRAVENOUS
  Filled 2013-11-23: qty 50

## 2013-11-23 MED ORDER — DEXTROSE 50 % IV SOLN
25.0000 mL | Freq: Once | INTRAVENOUS | Status: AC | PRN
  Administered 2013-11-23: 25 mL via INTRAVENOUS

## 2013-11-23 MED ORDER — SODIUM BICARBONATE 8.4 % IV SOLN
INTRAVENOUS | Status: DC
  Administered 2013-11-23: 07:00:00 via INTRAVENOUS
  Filled 2013-11-23: qty 1000

## 2013-11-23 MED ORDER — INSULIN DETEMIR 100 UNIT/ML ~~LOC~~ SOLN
7.0000 [IU] | Freq: Every day | SUBCUTANEOUS | Status: DC
  Administered 2013-11-23: 7 [IU] via SUBCUTANEOUS
  Filled 2013-11-23: qty 0.07

## 2013-11-23 MED ORDER — DEXTROSE 50 % IV SOLN
50.0000 mL | Freq: Once | INTRAVENOUS | Status: AC | PRN
  Administered 2013-11-23: 50 mL via INTRAVENOUS

## 2013-11-23 MED ORDER — INSULIN ASPART 100 UNIT/ML ~~LOC~~ SOLN
0.0000 [IU] | Freq: Three times a day (TID) | SUBCUTANEOUS | Status: DC
Start: 2013-11-23 — End: 2013-11-25
  Administered 2013-11-24: 5 [IU] via SUBCUTANEOUS
  Administered 2013-11-24: 7 [IU] via SUBCUTANEOUS
  Administered 2013-11-25: 3 [IU] via SUBCUTANEOUS

## 2013-11-23 MED ORDER — POTASSIUM CHLORIDE CRYS ER 20 MEQ PO TBCR
40.0000 meq | EXTENDED_RELEASE_TABLET | Freq: Once | ORAL | Status: AC
  Administered 2013-11-23: 40 meq via ORAL
  Filled 2013-11-23: qty 2

## 2013-11-23 MED ORDER — PIPERACILLIN-TAZOBACTAM 3.375 G IVPB
3.3750 g | Freq: Three times a day (TID) | INTRAVENOUS | Status: DC
  Administered 2013-11-23 – 2013-11-25 (×7): 3.375 g via INTRAVENOUS
  Filled 2013-11-23 (×8): qty 50

## 2013-11-23 MED ORDER — POTASSIUM CHLORIDE 2 MEQ/ML IV SOLN
INTRAVENOUS | Status: DC
  Administered 2013-11-23 – 2013-11-24 (×2): via INTRAVENOUS
  Filled 2013-11-23 (×3): qty 1000

## 2013-11-23 NOTE — Progress Notes (Signed)
Hypoglycemic Event  CBG: 31  Treatment: D50 IV 25 mL  Symptoms: Pale  Follow-up CBG: Time:0818 CBG Result:90  Possible Reasons for Event: Unknown  Comments/MD notified:pt more responsive, will make [place] aware    Genia HaroldRoth, Chestine Belknap Watkins  Remember to initiate Hypoglycemia Order Set & complet

## 2013-11-23 NOTE — Progress Notes (Signed)
Hypoglycemic Event  Lab glucose critical low: 22   Treatment: D50 IV 50 mL  Symptoms: Pale  Follow-up CBG: Time:0556 CBG Result:145 Possible Reasons for Event: Medication regimen: Just taken off insulin drip 11/22/13  Comments/MD notified:York, on call provider notified of critical lab value glucose and recheck CBG of 145 after 50mL of D50.  Rayfield CitizenLabbate, Alie Hardgrove L  Remember to initiate Hypoglycemia Order Set & complete

## 2013-11-23 NOTE — Progress Notes (Signed)
ANTIBIOTIC CONSULT NOTE - FOLLOW UP  Pharmacy Consult for Zosyn Indication: sepsis   No Known Allergies  Patient Measurements: Height: 5\' 7"  (170.2 cm) Weight: 139 lb 15.9 oz (63.5 kg) IBW/kg (Calculated) : 66.1  Vital Signs: Temp: 97.9 F (36.6 C) (10/24 0503) Temp Source: Axillary (10/24 0503) BP: 153/83 mmHg (10/24 0503) Pulse Rate: 66 (10/24 0503) Intake/Output from previous day: 10/23 0701 - 10/24 0700 In: 2951.7 [P.O.:120; I.V.:2231.7; IV Piggyback:600] Out: -  Intake/Output from this shift:    Labs:  Recent Labs  11/21/13 0327  11/22/13 0305 11/22/13 0711 11/22/13 1704 11/23/13 0433  WBC 11.9*  --  7.1  --   --   --   HGB 9.3*  --  9.7*  --   --   --   PLT 225  --  226  --   --   --   CREATININE 3.77*  < > 3.08* 2.96* 2.67* 2.43*  < > = values in this interval not displayed. Estimated Creatinine Clearance: 21.8 ml/min (by C-G formula based on Cr of 2.43).  Recent Labs  11/21/13 0200  VANCORANDOM 6.7      Assessment: 82 yoM from SNF presented with severe sepsis, FTT with recent back excoriation due to MRSA and MDR Klebsiella sensitive only to Bactrim per MD note. Was on Rx with cephelaxin and bactrim through approx 11/10/13. Recently diagnosed with left sided PNA and given Rocephin on 10/19. PNA on CXR 10/20.  Pharmacy was consulted to dose vancomycin and Zosyn for sepsis.  Vancomycin was changed to Zyvox on 10/23 in setting of renal insufficiency by attending MD with approval from ID physician.  10/20 >> vanc >>10/23 10/20 >> Zosyn >> 10/23 >> Zyvox >>  Tmax: AF  WBC: improved to WNL Renal: AKI, SCr steadily improving. Estimated CrCl now > 20 mL/min  10/20 MRSA screen: positive 10/20 urine cx: NGF 10/20 blood x 2: ngtd  Today is day #5 Zosyn 2.25 grams IV q6h and day #2 Zyvox 600 mg IV q12h for sepsis secondary to HCAP with or without contribution from skin infection.  Goal of Therapy:  Appropriate antibiotic dosing for renal function;  eradication of infection.   Plan:  1. Change Zosyn to extended-infusion regimen (3.375 grams IV q8h, each dose over 4 hours) for CrCl > 20 mL/min. 2. Zyvox as per MD orders. 3. Follow clinical course.  Elie Goodyandy Mykelti Goldenstein, PharmD, BCPS Pager: [phone] 11/23/2013  10:15 AM

## 2013-11-23 NOTE — Progress Notes (Signed)
Pt's blood sugar fluctuating, very agitated at this time, doesn't want to eat.  Discussed with MD, will continue to closely monitor pt.

## 2013-11-23 NOTE — Progress Notes (Signed)

## 2013-11-23 NOTE — Progress Notes (Signed)
[facility] PROGRESS NOTE  [name] MostMichael F Walker ZOX:[id number] DOB: 10/07/1931 DOA: 11/19/2013 PCP: No primary provider on file.  Assessment/Plan: 1. DKA type 2: with uncontrolled diabetes and previous admissions for DKA -A. Gap 13; and Bicarb 29; CBG's demonstrating hypoglycemia now -encourage PO intake -lantus adjusted to 7 units QHS and SSI -continue bicarb tablets now and IVF's (d5 with potassium) -will follow CBG Q4h  2-Sepsis: due to PNA as seen on CXR; with or without contribution from skin infection (decubitus ulcers) -continue zosyn and zyvox now (given renal failure), discussed with ID -follow response -will continue pulmicort -continue as possible flutter valve -PRN oxygen supplementation  3-lactic acidosis: due to metformin, sepsis from PNA and renal failure -continue IVF's -recheck lactic acid in am -bicarb 29 and anion gap 13  4-acute on chronic renal failure: at baseline stage 4. Improved -metformin discontinue -avoid/minimize nephrotoxic agents -follow renal function  -vancomycin discontinued -continue IVF's -Cr 2.43 on 10/24  5-dementia/toxic/metabolic encephalopathy: oriented X1 today -Walker likely from hypoglycemia; on top of dementia and current infection -will monitor -continue abx's therapy -will adjust hypoglycemic regimen  6-demand ischemia: troponin slightly elevated -no ischemic changes on EKG -Walker likely from renal failure' patient denies CP -continue IVF's and supportive care.  -continue B-blocker  7-dysphagia: concerns for aspiration -following SPT rec's, will start dysphagia 3 with nectar thick liquids   Code Status: DNR Family Communication: no family at bedside Disposition Plan: to be determine    Consultants:  PCCM  ID curbside (for zyvox authorization; dr. Ninetta LightsHatcher)  Procedures:  See below for x-ray reports  Antibiotics:  Zosyn 10/20  zyvox 10/23  HPI/Subjective: Patient oriented X 1; mildly agitated and  confused. Several episodes of hypoglycemia.  Objective: Filed Vitals:   11/23/13 1431  BP: 100/40  Pulse: 67  Temp: 97.6 F (36.4 C)  Resp: 18    Intake/Output Summary (Last 24 hours) at 11/23/13 1438 Last data filed at 11/23/13 1147  Gross per 24 hour  Intake 3316.67 ml  Output      0 ml  Net 3316.67 ml   Filed Weights   11/19/13 0323 11/22/13 1114  Weight: 65 kg (143 lb 4.8 oz) 63.5 kg (139 lb 15.9 oz)    Exam:   General:  Mentation slightly worse this morning; Walker likely from hypoglycemia; no CP, no fever; able to follow commands and denies SOB or abd pain  Cardiovascular: S1 and S2, no rubs or gallops  Respiratory: scattered rhonchi, no crackles, no exp wheezing appreciated  Abdomen: soft, NT, ND, positive BS  Musculoskeletal: no edema, no cyanosis  Data Reviewed: Basic Metabolic Panel:  Recent Labs Lab 11/19/13 0419 11/19/13 0503  11/20/13 0630  11/21/13 0327  11/21/13 2246 11/22/13 0305 11/22/13 0711 11/22/13 1704 11/23/13 0433  NA 149* 149*  < > 151*  < > 152*  < > 148* 152* 150* 153* 154*  K 4.4 3.3*  < > 4.6  < > 3.3*  < > 5.6* 3.4* 3.1* 3.2* 2.5*  CL 115* 122*  < > 119*  < > 116*  < > 112 114* 110 112 112  CO2 11* 10*  < > 13*  < > 19  < > 21 21 23 27 29   GLUCOSE 418* 284*  < > 169*  < > 205*  < > 258* 255* 271* 142* 22*  BUN 74* 60*  < > 70*  < > 62*  < > 55* 54* 52* 45* 37*  CREATININE 4.24* 3.41*  < > 4.56*  < >  3.77*  < > 3.05* 3.08* 2.96* 2.67* 2.43*  CALCIUM 8.2* 6.2*  < > 7.8*  < > 7.9*  < > 7.7* 7.9* 7.9* 7.8* 7.9*  MG  --  1.3*  --  1.5  --  1.6  --   --  1.5  --   --   --   PHOS  --  3.0  --  4.9*  --  4.2  --   --  3.9  --   --   --   < > = values in this interval not displayed. Liver Function Tests:  Recent Labs Lab 11/19/13 0120 11/20/13 0630  AST 20 20  ALT 9 12  ALKPHOS 61 52  BILITOT 0.3 0.2*  PROT 6.5 5.6*  ALBUMIN 2.4* 2.0*   CBC:  Recent Labs Lab 11/19/13 0120 11/19/13 0503 11/19/13 0725 11/20/13 0630  11/21/13 0327 11/22/13 0305  WBC 18.3* 13.2* 14.6* 16.8* 11.9* 7.1  NEUTROABS 16.5* 11.8*  --  15.3* 10.6* 5.8  HGB 11.5* 8.0* 9.2* 9.7* 9.3* 9.7*  HCT 34.0* 23.5* 27.1* 29.0* 28.1* 28.5*  MCV 89.0 89.7 88.0 88.1 87.8 87.4  PLT 313 PLATELET CLUMPS NOTED ON SMEAR, COUNT APPEARS ADEQUATE 243 247 225 226   Cardiac Enzymes:  Recent Labs Lab 11/19/13 0503 11/19/13 1108 11/19/13 1542 11/19/13 2245  TROPONINI <0.30 <0.30 0.35* 0.31*   CBG:  Recent Labs Lab 11/23/13 0620 11/23/13 0746 11/23/13 0816 11/23/13 0901 11/23/13 1127  GLUCAP 83 31* 90 54* 104*    Recent Results (from the past 240 hour(s))  URINE CULTURE     Status: None   Collection Time    11/19/13  1:09 AM      Result Value Ref Range Status   Specimen Description URINE, RANDOM   Final   Special Requests NONE   Final   Culture  Setup Time     Final   Value: 11/19/2013 08:12     Performed at [facility] Count     Final   Value: NO GROWTH     Performed at [facility]   Culture     Final   Value: NO GROWTH     Performed at [facility]   Report Status 11/20/2013 FINAL   Final  CULTURE, BLOOD (ROUTINE X 2)     Status: None   Collection Time    11/19/13  1:20 AM      Result Value Ref Range Status   Specimen Description BLOOD BLOOD LEFT FOREARM   Final   Special Requests BOTTLES DRAWN AEROBIC AND ANAEROBIC 5CC   Final   Culture  Setup Time     Final   Value: 11/19/2013 04:21     Performed at [facility]   Culture     Final   Value:        BLOOD CULTURE RECEIVED NO GROWTH TO DATE CULTURE WILL BE HELD FOR 5 DAYS BEFORE ISSUING A FINAL NEGATIVE REPORT     Performed at [facility]   Report Status PENDING   Incomplete  CULTURE, BLOOD (ROUTINE X 2)     Status: None   Collection Time    11/19/13  1:20 AM      Result Value Ref Range Status   Specimen Description BLOOD BLOOD RIGHT FOREARM   Final   Special Requests BOTTLES DRAWN AEROBIC AND ANAEROBIC 1CC    Final   Culture  Setup Time     Final  Value: 11/19/2013 04:20     [facility]   Culture     Final   Value:        BLOOD CULTURE RECEIVED NO GROWTH TO DATE CULTURE WILL BE HELD FOR 5 DAYS BEFORE ISSUING A FINAL NEGATIVE REPORT     [facility]formed at [facility]   Report Status PENDING   Incomplete  MRSA PCR SCREENING     Status: Abnormal   Collection Time    11/19/13  6:30 AM      Result Value Ref Range Status   MRSA by PCR POSITIVE (*) NEGATIVE Final   Comment:            The GeneXpert MRSA Assay (FDA     approved for NASAL specimens     only), is one component of a     comprehensive MRSA colonization     surveillance program. It is not     intended to diagnose MRSA     infection nor to guide or     monitor treatment for     MRSA infections.     RESULT CALLED TO, READ BACK BY AND VERIFIED WITH:     Wardell HonourJ. CONRAD RN 11:10 11/19/13 ([email or web address])     [facility]  CLOSTRIDIUM DIFFICILE BY PCR     Status: None   Collection Time    11/21/13  5:05 PM      Result Value Ref Range Status   C difficile by pcr NEGATIVE  NEGATIVE Final   Comment: [facility]      Studies: No results found.  Scheduled Meds: . antiseptic oral rinse  7 mL Mouth Rinse q12n4p  . budesonide (PULMICORT) nebulizer solution  0.25 mg Nebulization BID  . camphor-menthol  1 application Topical BID  . chlorhexidine  15 mL Mouth Rinse BID  . Chlorhexidine Gluconate Cloth  6 each Topical Q0600  . clopidogrel  75 mg Oral Q breakfast  . divalproex  375 mg Oral QHS  . donepezil  10 mg Oral QHS  . heparin subcutaneous  5,000 Units Subcutaneous Q12H  . insulin aspart  0-9 Units Subcutaneous TID WC  . insulin detemir  7 Units Subcutaneous QHS  . ipratropium-albuterol  3 mL Nebulization Q6H  . levETIRAcetam  500 mg Intravenous Q24H  . linezolid  600 mg Intravenous Q12H  . metoprolol  5 mg Intravenous 4 times per day  . montelukast  10 mg Oral q morning -  10a  . mupirocin ointment  1 application Nasal BID  . pantoprazole (PROTONIX) IV  40 mg Intravenous Q24H  . piperacillin-tazobactam (ZOSYN)  IV  3.375 g Intravenous Q8H  . sodium bicarbonate  650 mg Oral Daily   Continuous Infusions: . dextrose 5 % 1,000 mL with potassium chloride 40 mEq infusion 50 mL/hr at 11/23/13 [id number]    Principal Problem:   Sepsis Active Problems:   UTI (lower urinary tract infection)   Acute renal failure   Lactic acidosis   Severe sepsis with acute organ dysfunction   Acute encephalopathy    Time spent: 30 minutes     Vassie LollMadera, Aadhira Heffernan  [facility] Pager [phone]. If 7PM-7AM, please contact night-coverage at [email or web address], password [email or web address] 11/23/2013, 2:38 PM  LOS: 4 days

## 2013-11-23 NOTE — Progress Notes (Signed)
Adalberto IllM. York, On call provider paged regarding patient recheck CBG 83. Order placed for D5 to be added to fluids. Awaiting pharmacy to deliver.

## 2013-11-23 NOTE — Progress Notes (Signed)
CRITICAL VALUE ALERT  Critical value received:  Potassium 2.5, Glucose 22 Date of notification:  11/23/2013 Time of notification:  0544  Critical value read back:Yes.    Nurse who received alert:  Karlyne GreenspanJ. Labbate, RN  MD notified (1st page):  York, on call  Time of first page:  [phone]  MD notified (2nd page): N/A  Time of second page: N/A  Responding MD:  Orders placed by on call provider. Patient treated per hypoglycemia protocol. Time MD responded:  [phone]

## 2013-11-24 LAB — BASIC METABOLIC PANEL
ANION GAP: 12 (ref 5–15)
ANION GAP: 15 (ref 5–15)
BUN: 24 mg/dL — ABNORMAL HIGH (ref 6–23)
BUN: 25 mg/dL — ABNORMAL HIGH (ref 6–23)
CHLORIDE: 109 meq/L (ref 96–112)
CHLORIDE: 106 meq/L (ref 96–112)
CO2: 28 meq/L (ref 19–32)
CO2: 24 mEq/L (ref 19–32)
Calcium: 7.4 mg/dL — ABNORMAL LOW (ref 8.4–10.5)
Calcium: 7.3 mg/dL — ABNORMAL LOW (ref 8.4–10.5)
Creatinine, Ser: 2.27 mg/dL — ABNORMAL HIGH (ref 0.50–1.35)
Creatinine, Ser: 2.12 mg/dL — ABNORMAL HIGH (ref 0.50–1.35)
GFR calc Af Amer: 30 mL/min — ABNORMAL LOW (ref >90)
GFR calc non Af Amer: 26 mL/min — ABNORMAL LOW (ref >90)
GFR calc non Af Amer: 28 mL/min — ABNORMAL LOW (ref >90)
GFR, EST AFRICAN AMERICAN: 32 mL/min — AB (ref >90)
GLUCOSE: 142 mg/dL — AB (ref 70–99)
Glucose, Bld: 272 mg/dL — ABNORMAL HIGH (ref 70–99)
POTASSIUM: 3.6 meq/L — AB (ref 3.7–5.3)
Potassium: 3.3 mEq/L — ABNORMAL LOW (ref 3.7–5.3)
SODIUM: 149 meq/L — AB (ref 137–147)
Sodium: 145 mEq/L (ref 137–147)

## 2013-11-24 LAB — GLUCOSE, CAPILLARY
GLUCOSE-CAPILLARY: 140 mg/dL — AB (ref 70–99)
GLUCOSE-CAPILLARY: 185 mg/dL — AB (ref 70–99)
Glucose-Capillary: 159 mg/dL — ABNORMAL HIGH (ref 70–99)
Glucose-Capillary: 191 mg/dL — ABNORMAL HIGH (ref 70–99)
Glucose-Capillary: 273 mg/dL — ABNORMAL HIGH (ref 70–99)
Glucose-Capillary: 346 mg/dL — ABNORMAL HIGH (ref 70–99)
Glucose-Capillary: 93 mg/dL (ref 70–99)

## 2013-11-24 MED ORDER — METOPROLOL SUCCINATE ER 50 MG PO TB24
50.0000 mg | ORAL_TABLET | Freq: Every day | ORAL | Status: DC
  Administered 2013-11-24 – 2013-11-25 (×2): 50 mg via ORAL
  Filled 2013-11-24 (×2): qty 1

## 2013-11-24 MED ORDER — PANTOPRAZOLE SODIUM 40 MG PO TBEC
40.0000 mg | DELAYED_RELEASE_TABLET | Freq: Every day | ORAL | Status: DC
  Administered 2013-11-25: 40 mg via ORAL
  Filled 2013-11-24: qty 1

## 2013-11-24 MED ORDER — INSULIN DETEMIR 100 UNIT/ML ~~LOC~~ SOLN
10.0000 [IU] | Freq: Every day | SUBCUTANEOUS | Status: DC
  Administered 2013-11-24: 10 [IU] via SUBCUTANEOUS
  Filled 2013-11-24 (×2): qty 0.1

## 2013-11-24 MED ORDER — LEVETIRACETAM ER 500 MG PO TB24
500.0000 mg | ORAL_TABLET | Freq: Every day | ORAL | Status: DC
  Administered 2013-11-25: 500 mg via ORAL
  Filled 2013-11-24: qty 1

## 2013-11-24 NOTE — Progress Notes (Signed)
[facility] PROGRESS NOTE  HARSHAN KEARLEY ZOX:[id number] DOB: [birth year] DOA: 11/19/2013 PCP: No primary provider on file.  Assessment/Plan: 1. DKA type 2: with uncontrolled diabetes and previous admissions for DKA -A. Gap 12; and Bicarb 28; CBG's started to climb again -lantus adjusted to 10 units QHS and SSI -continue bicarb tablets for now (given CKD) and will stop IVF's and monitor electrolytes. -patient encourage to eat and drink -will follow CBG Q12h  2-Sepsis: due to PNA as seen on CXR; with or without contribution from skin infection (decubitus ulcers) -continue zosyn X 1 more day and zyvox now (given renal failure), discussed with ID; if remains stable will switch zyvox to PO and complete a total of 10 days -follow response -will continue pulmicort -continue as possible flutter valve -PRN oxygen supplementation  3-lactic acidosis: due to metformin, sepsis from PNA and renal failure -will discontinue IVF's -recheck lactic acid in am -bicarb 28 and anion gap 12  4-acute on chronic renal failure: at baseline stage 4. Improved -metformin discontinue -avoid/minimize nephrotoxic agents -renal function continue improving -vancomycin discontinued -continue IVF's -Cr 2.27 on 10/25  5-dementia/toxic/metabolic encephalopathy: oriented X1 today -most likely from hypoglycemia; on top of dementia and current infection -will monitor -continue abx's therapy -will adjust hypoglycemic regimen  6-demand ischemia: troponin slightly elevated -no ischemic changes on EKG -most likely from renal failure' patient denies CP -continue IVF's and supportive care.  -continue B-blocker  7-Dysphagia: concerns for aspiration -following SPT rec's, will start dysphagia 3 with nectar thick liquids   Code Status: DNR Family Communication: no family at bedside Disposition Plan: to be determine    Consultants:  PCCM  ID curbside (for zyvox authorization; Dr.  Ninetta Lights)  Procedures:  See below for x-ray reports  Antibiotics:  Zosyn 10/20  zyvox 10/23  HPI/Subjective: Patient more oriented, no CP, no SOB. Electrolytes and renal function continue improving.   Objective: Filed Vitals:   11/24/13 1031  BP: 112/53  Pulse: 66  Temp: 97.6 F (36.4 C)  Resp: 18    Intake/Output Summary (Last 24 hours) at 11/24/13 1409 Last data filed at 11/24/13 1400  Gross per 24 hour  Intake 2841.67 ml  Output      0 ml  Net 2841.67 ml   Filed Weights   11/19/13 0323 11/22/13 1114  Weight: 65 kg (143 lb 4.8 oz) 63.5 kg (139 lb 15.9 oz)    Exam:   General:  Mentation better today; patient eating more. CBG's in 300 range. no CP, no fever; able to follow commands and denies SOB or abd pain  Cardiovascular: S1 and S2, no rubs or gallops  Respiratory: scattered rhonchi, no crackles, no exp wheezing appreciated  Abdomen: soft, NT, ND, positive BS  Musculoskeletal: no edema, no cyanosis  Data Reviewed: Basic Metabolic Panel:  Recent Labs Lab 11/19/13 0419 11/19/13 0503  11/20/13 0630  11/21/13 0327  11/22/13 0305 11/22/13 8119 11/22/13 1704 11/23/13 0433 11/23/13 1649 11/24/13 0525  NA 149* 149*  < > 151*  < > 152*  < > 152* 150* 153* 154* 150* 149*  K 4.4 3.3*  < > 4.6  < > 3.3*  < > 3.4* 3.1* 3.2* 2.5* 3.0* 3.3*  CL 115* 122*  < > 119*  < > 116*  < > 114* 110 112 112 109 109  CO2 11* 10*  < > 13*  < > 19  < > 21 23 27 29 30 28   GLUCOSE 418* 284*  < > 169*  < >  205*  < > 255* 271* 142* 22* 77 142*  BUN 74* 60*  < > 70*  < > 62*  < > 54* 52* 45* 37* 30* 24*  CREATININE 4.24* 3.41*  < > 4.56*  < > 3.77*  < > 3.08* 2.96* 2.67* 2.43* 2.35* 2.27*  CALCIUM 8.2* 6.2*  < > 7.8*  < > 7.9*  < > 7.9* 7.9* 7.8* 7.9* 7.4* 7.4*  MG  --  1.3*  --  1.5  --  1.6  --  1.5  --   --   --   --   --   PHOS  --  3.0  --  4.9*  --  4.2  --  3.9  --   --   --   --   --   < > = values in this interval not displayed. Liver Function Tests:  Recent  Labs Lab 11/19/13 0120 11/20/13 0630  AST 20 20  ALT 9 12  ALKPHOS 61 52  BILITOT 0.3 0.2*  PROT 6.5 5.6*  ALBUMIN 2.4* 2.0*   CBC:  Recent Labs Lab 11/19/13 0120 11/19/13 0503 11/19/13 0725 11/20/13 0630 11/21/13 0327 11/22/13 0305  WBC 18.3* 13.2* 14.6* 16.8* 11.9* 7.1  NEUTROABS 16.5* 11.8*  --  15.3* 10.6* 5.8  HGB 11.5* 8.0* 9.2* 9.7* 9.3* 9.7*  HCT 34.0* 23.5* 27.1* 29.0* 28.1* 28.5*  MCV 89.0 89.7 88.0 88.1 87.8 87.4  PLT 313 PLATELET CLUMPS NOTED ON SMEAR, COUNT APPEARS ADEQUATE 243 247 225 226   Cardiac Enzymes:  Recent Labs Lab 11/19/13 0503 11/19/13 1108 11/19/13 1542 11/19/13 2245  TROPONINI <0.30 <0.30 0.35* 0.31*   CBG:  Recent Labs Lab 11/23/13 2151 11/24/13 0009 11/24/13 0431 11/24/13 0830 11/24/13 1221  GLUCAP 140* 159* 191* 93 346*    Recent Results (from the past 240 hour(s))  URINE CULTURE     Status: None   Collection Time    11/19/13  1:09 AM      Result Value Ref Range Status   Specimen Description URINE, RANDOM   Final   Special Requests NONE   Final   Culture  Setup Time     Final   Value: 11/19/2013 08:12     Performed at [facility] Count     Final   Value: NO GROWTH     Performed at [facility]   Culture     Final   Value: NO GROWTH     Performed at [facility]   Report Status 11/20/2013 FINAL   Final  CULTURE, BLOOD (ROUTINE X 2)     Status: None   Collection Time    11/19/13  1:20 AM      Result Value Ref Range Status   Specimen Description BLOOD BLOOD LEFT FOREARM   Final   Special Requests BOTTLES DRAWN AEROBIC AND ANAEROBIC 5CC   Final   Culture  Setup Time     Final   Value: 11/19/2013 04:21     Performed at [facility]   Culture     Final   Value:        BLOOD CULTURE RECEIVED NO GROWTH TO DATE CULTURE WILL BE HELD FOR 5 DAYS BEFORE ISSUING A FINAL NEGATIVE REPORT     Performed at [facility]   Report Status PENDING   Incomplete  CULTURE, BLOOD  (ROUTINE X 2)     Status: None   Collection Time  11/19/13  1:20 AM      Result Value Ref Range Status   Specimen Description BLOOD BLOOD RIGHT FOREARM   Final   Special Requests BOTTLES DRAWN AEROBIC AND ANAEROBIC 1CC   Final   Culture  Setup Time     Final   Value: 11/19/2013 04:20     Performed at [facility]   Culture     Final   Value:        BLOOD CULTURE RECEIVED NO GROWTH TO DATE CULTURE WILL BE HELD FOR 5 DAYS BEFORE ISSUING A FINAL NEGATIVE REPORT     Performed at [facility]   Report Status PENDING   Incomplete  MRSA PCR SCREENING     Status: Abnormal   Collection Time    11/19/13  6:30 AM      Result Value Ref Range Status   MRSA by PCR POSITIVE (*) NEGATIVE Final   Comment:            The GeneXpert MRSA Assay (FDA     approved for NASAL specimens     only), is one component of a     comprehensive MRSA colonization     surveillance program. It is not     intended to diagnose MRSA     infection nor to guide or     monitor treatment for     MRSA infections.     RESULT CALLED TO, READ BACK BY AND VERIFIED WITH:     Wardell HonourJ. CONRAD RN 11:10 11/19/13 ([email or web address])     Performed [facility]  CLOSTRIDIUM DIFFICILE BY PCR     Status: None   Collection Time    11/21/13  5:05 PM      Result Value Ref Range Status   C difficile by pcr NEGATIVE  NEGATIVE Final   Comment: Performed at [facility]     Studies: No results found.  Scheduled Meds: . antiseptic oral rinse  7 mL Mouth Rinse q12n4p  . budesonide (PULMICORT) nebulizer solution  0.25 mg Nebulization BID  . camphor-menthol  1 application Topical BID  . chlorhexidine  15 mL Mouth Rinse BID  . Chlorhexidine Gluconate Cloth  6 each Topical Q0600  . clopidogrel  75 mg Oral Q breakfast  . divalproex  375 mg Oral QHS  . donepezil  10 mg Oral QHS  . feeding supplement (GLUCERNA SHAKE)  237 mL Oral BID BM  . heparin subcutaneous  5,000 Units Subcutaneous Q12H  . insulin aspart  0-9 Units  Subcutaneous TID WC  . insulin detemir  10 Units Subcutaneous QHS  . ipratropium-albuterol  3 mL Nebulization Q6H  . [START ON 11/25/2013] levETIRAcetam  500 mg Oral Daily  . linezolid  600 mg Intravenous Q12H  . metoprolol succinate  50 mg Oral Daily  . montelukast  10 mg Oral q morning - 10a  . mupirocin ointment  1 application Nasal BID  . [START ON 11/25/2013] pantoprazole  40 mg Oral Q1200  . piperacillin-tazobactam (ZOSYN)  IV  3.375 g Intravenous Q8H  . sodium bicarbonate  650 mg Oral Daily   Continuous Infusions:    Principal Problem:   Sepsis Active Problems:   UTI (lower urinary tract infection)   Acute renal failure   Lactic acidosis   Severe sepsis with acute organ dysfunction   Acute encephalopathy    Time spent: 30 minutes     Vassie LollMadera, Elany Felix  [facility] Pager [phone]. If 7PM-7AM, please contact night-coverage  at [email or web address], password [email or web address] 11/24/2013, 2:09 PM  LOS: 5 days

## 2013-11-25 LAB — CULTURE, BLOOD (ROUTINE X 2)
CULTURE: NO GROWTH
Culture: NO GROWTH

## 2013-11-25 LAB — GLUCOSE, CAPILLARY
GLUCOSE-CAPILLARY: 215 mg/dL — AB (ref 70–99)
GLUCOSE-CAPILLARY: 244 mg/dL — AB (ref 70–99)
GLUCOSE-CAPILLARY: 82 mg/dL (ref 70–99)
Glucose-Capillary: 163 mg/dL — ABNORMAL HIGH (ref 70–99)

## 2013-11-25 LAB — CLOSTRIDIUM DIFFICILE BY PCR: Toxigenic C. Difficile by PCR: NEGATIVE

## 2013-11-25 MED ORDER — GLUCERNA SHAKE PO LIQD: 237.0000 mL | Freq: Two times a day (BID) | ORAL | Status: AC

## 2013-11-25 MED ORDER — ESCITALOPRAM OXALATE 20 MG PO TABS: 20.0000 mg | ORAL_TABLET | Freq: Every morning | ORAL | Status: AC

## 2013-11-25 MED ORDER — HYDROXYZINE HCL 10 MG PO TABS: 10.0000 mg | ORAL_TABLET | Freq: Three times a day (TID) | ORAL | Status: AC | PRN

## 2013-11-25 MED ORDER — LINEZOLID 600 MG PO TABS: 600.0000 mg | ORAL_TABLET | Freq: Two times a day (BID) | ORAL | Status: AC

## 2013-11-25 MED ORDER — LORAZEPAM 0.5 MG PO TABS: 0.5000 mg | ORAL_TABLET | Freq: Every day | ORAL | Status: AC

## 2013-11-25 MED ORDER — SODIUM BICARBONATE 650 MG PO TABS: 650.0000 mg | ORAL_TABLET | Freq: Every day | ORAL | Status: AC

## 2013-11-25 MED ORDER — INSULIN ASPART 100 UNIT/ML ~~LOC~~ SOLN: 15.0000 [IU] | SUBCUTANEOUS | Status: AC

## 2013-11-25 MED ORDER — INSULIN DETEMIR 100 UNIT/ML ~~LOC~~ SOLN: 18.0000 [IU] | Freq: Two times a day (BID) | SUBCUTANEOUS | Status: AC

## 2013-11-25 MED ORDER — RESOURCE THICKENUP CLEAR PO POWD: ORAL | Status: AC

## 2013-11-25 NOTE — Progress Notes (Signed)
Pt for discharge [place] [facility] [place].  CSW facilitated pt discharge needs including contacting facility, faxing pt discharge paperwork via [facility], discussing with pt son, Ree KidaJack via telephone, providing RN phone number to call report, and arranging ambulance transport for pt back [place] [facility] [place] [facility].   Pt son appreciative of CSW communication re: transition back to [facility] [place]. Pt son was concerned if pt was medically ready for discharge back to SNF and CSW discussed that per MD, pt medically ready and MD recommended SNF MD to consult palliative care to have further discussions re: pt family goals of care with pt. Pt son expressed understanding.  No further social work needs identified at this time.  CSW signing off.   Loletta SpecterSuzanna Kidd, MSW, LCSW Clinical Social Work Coverage for [employer], [place] [phone]

## 2013-11-25 NOTE — Progress Notes (Signed)
Inpatient Diabetes Program Recommendations  AACE/ADA: New Consensus Statement on Inpatient Glycemic Control (2013)  Target Ranges:  Prepandial:   less than 140 mg/dL      Peak postprandial:   less than 180 mg/dL (1-2 hours)      Critically ill patients:  140 - 180 mg/dL   Reason for Visit: Hyperglycemia  Eating approx 25% meals today. Drinking Ensure supplement. Blood sugars elevated. Needs meal coverage insulin to cover CHOs in food and supplements.  Recommendations: Add Novolog 3 units tidwc if pt eats half of meal tray or 25% meal + supplement. Will need to titrate Lantus if FBS > 180 mg/dL.  Will continue to follow. Thank you. Ailene Ardshonda Serenna Deroy, RD, LDN, CDE Inpatient Diabetes Coordinator [phone]

## 2013-11-25 NOTE — Progress Notes (Signed)
Discharge [facility], [facility]. PIV removed by NT no s/s of infiltration or swelling noted on insertion site. P/u by [facility].

## 2013-11-25 NOTE — Discharge Summary (Addendum)
Physician Discharge Summary  SHREYAS PIATKOWSKI ZOX:[id number] DOB: [birth year] DOA: 11/19/2013  PCP: No primary provider on file.  Admit date: 11/19/2013 Discharge date: 11/25/2013  Time spent: >30 minutes  Recommendations for Outpatient Follow-up:  Check BMET in 1 week to follow electrolytes and renal function Patient very labile and will required close follow up on his hypoglycemic regimen to prevent low sugars or further hyperglycemic states. Patient condition is steadily declining and would benefit of Palliative care to redefine GOC and advance directives (including further hospitalization and treatments)   Discharge Diagnoses:  Principal Problem:   Sepsis Active Problems:   UTI (lower urinary tract infection)   Acute renal failure   Lactic acidosis   Severe sepsis with acute organ dysfunction   Acute encephalopathy   Discharge Condition: will discharge back to SNF for further care and treatment.   Diet recommendation: low sodium and low carb diet. Dysphagia 3 diet with nectar thick liquids  Filed Weights   11/19/13 0323 11/22/13 1114 11/25/13 0606  Weight: 65 kg (143 lb 4.8 oz) 63.5 kg (139 lb 15.9 oz) 67.2 kg (148 lb 2.4 oz)    History of present illness:  80 y/o ? h/o Dm ty 2, prior DKA, COPD, Htn, CAD-STEMI 09/27/07, H/o Penile implant 2005, h/o isolated episode P afib, prior diverticular disease + polyps, Subdural hematoma 2/2 effient s/p craniotomy 03/2008, came to emergency room 11/18/12 because of hyperglycemia and fever. Noted derm visit recently excoriation on the back with cultures growing MRSA/s and was treated with Keflex and Bactrim through 11/10/48. Apparently patient was diagnosed at [facility] center with pneumonia 10/19 and given 1 g of Rocephin. On admission had oral MAXIMUM TEMPERATURE 102 and CBC 18000.    Hospital Course:  1- DKA type 2: with uncontrolled diabetes and previous admissions for DKA -DKA corrected at discharge -lantus adjusted to 18  units BID and will continue using novolog TID -patient diabetes is very labile and will required close follow up on his hypoglycemic regimen to prevent low sugars or further hyperglycemic states. -continue bicarb tablets for now (given CKD)  -patient encourage not to skip any meals  2-Sepsis: due to PNA as seen on CXR; with or without contribution from skin infection (decubitus ulcers)  -treated with zosyn and zyvox. Once stable and given renal failure; decision was to treat with zyvox (which would cover both sources of infection w/o affecting his renal function -case discussed with ID (which agree with treatment) -will discharge on zyvox x 9 more days -continue as possible flutter valve   3-lactic acidosis: due to metformin, sepsis from PNA and renal failure  -encourage to keep himself hydrated -rechecked lactic acid WNL  -bicarb 24 at discharge -will continue daily sodium bicarb tablets  4-acute on chronic renal failure: at baseline stage 4. Improved  -metformin discontinue due to renal failure -avoid/minimize nephrotoxic agents  -renal function back to baseline now  -vancomycin discontinued  -encourage to keep himself well hydrated -Cr 2.12 on 10/26   5-dementia/toxic/metabolic encephalopathy: oriented to self and place (which his baseline) -most likely from hypoglycemia; on top of underlying dementia and current infection  -improving and pretty much back to baseline at discharge -continue abx's therapy  -will adjust hypoglycemic regimen to prevent low sugars and/or hyperglycemia   6-demand ischemia: troponin slightly elevated  -no ischemic changes on EKG  -most likely from renal failure; as patient denies CP  -continue B-blocker and plavix  7-Dysphagia: with concerns for aspiration  -following SPT rec's, will discharge on  dysphagia 3 diet with nectar thick liquids   8-depression: plan is to resume lexapro in 9 days after zyvox course competed  *rest of medical problems  remains stable and the plan is to continue current medication regimen.  Procedures:    Consultations:  None   Discharge Exam: Filed Vitals:   11/25/13 0606  BP: 111/49  Pulse: 71  Temp: 98.1 F (36.7 C)  Resp: 20   General: Mentation back to baseline; cooperative and oriented to self and place. CBG's in 200's range. no CP, no fever; able to follow commands and denies SOB or abd pain  Cardiovascular: S1 and S2, no rubs or gallops  Respiratory: scattered rhonchi, no crackles, no exp wheezing appreciated  Abdomen: soft, NT, ND, positive BS  Musculoskeletal: no edema, no cyanosis   Discharge Instructions You were cared for by a hospitalist during your hospital stay. If you have any questions about your discharge medications or the care you received while you were in the hospital after you are discharged, you can call the unit and asked to speak with the hospitalist on call if the hospitalist that took care of you is not available. Once you are discharged, your primary care physician will handle any further medical issues. Please note that NO REFILLS for any discharge medications will be authorized once you are discharged, as it is imperative that you return to your primary care physician (or establish a relationship with a primary care physician if you do not have one) for your aftercare needs so that they can reassess your need for medications and monitor your lab values.  Discharge Instructions   Diet - low sodium heart healthy    Complete by:  As directed      Discharge instructions    Complete by:  As directed   Encourage PO intake, good hydration and medication compliance Zyvox To be taken for 9 more days until 12/04/13 Lexapro to be on hold until Zyvox treatment completed Patient overall condition/prognosis is poor and would benefit of delineation of GOC and advance directive          Current Discharge Medication List    START taking these medications   Details  feeding  supplement, GLUCERNA SHAKE, (GLUCERNA SHAKE) LIQD Take 237 mLs by mouth 2 (two) times daily between meals. Refills: 0    linezolid (ZYVOX) 600 MG tablet Take 1 tablet (600 mg total) by mouth 2 (two) times daily.    Maltodextrin-Xanthan Gum (RESOURCE THICKENUP CLEAR) POWD To use as needed to make liquids nectar thick    sodium bicarbonate 650 MG tablet Take 1 tablet (650 mg total) by mouth daily.      CONTINUE these medications which have CHANGED   Details  escitalopram (LEXAPRO) 20 MG tablet Take 1 tablet (20 mg total) by mouth every morning. Hold until treatment with zyvox completed    !! hydrOXYzine (ATARAX/VISTARIL) 10 MG tablet Take 1 tablet (10 mg total) by mouth every 8 (eight) hours as needed for itching. Qty: 20 tablet, Refills: 0    insulin aspart (NOVOLOG) 100 UNIT/ML injection Inject 15 Units into the skin as directed. 3 times daily after meals if >50% meal consumed Qty: 10 mL, Refills: 11    insulin detemir (LEVEMIR) 100 UNIT/ML injection Inject 0.18 mLs (18 Units total) into the skin 2 (two) times daily. Qty: 10 mL, Refills: 11    LORazepam (ATIVAN) 0.5 MG tablet Take 1 tablet (0.5 mg total) by mouth at bedtime. Qty: 30 tablet, Refills: 0     !! -  Potential duplicate medications found. Please discuss with provider.    CONTINUE these medications which have NOT CHANGED   Details  acetaminophen (TYLENOL) 325 MG tablet Take 650 mg by mouth at bedtime.    camphor-menthol (SARNA) lotion Apply 1 application topically 2 (two) times daily.    cholecalciferol (VITAMIN D) 1000 UNITS tablet Take 1,000 Units by mouth every morning.     clopidogrel (PLAVIX) 75 MG tablet Take 75 mg by mouth daily with breakfast.    divalproex (DEPAKOTE SPRINKLE) 125 MG capsule Take 375 mg by mouth at bedtime.    donepezil (ARICEPT) 10 MG tablet Take 10 mg by mouth at bedtime.    !! hydrOXYzine (ATARAX/VISTARIL) 10 MG tablet Take 10 mg by mouth daily. AT 9 am.    levETIRAcetam (KEPPRA) 500 MG  tablet Take 500 mg by mouth every morning.     metoprolol succinate (TOPROL-XL) 50 MG 24 hr tablet Take 50 mg by mouth every morning. Take with or immediately following a meal.    montelukast (SINGULAIR) 10 MG tablet Take 10 mg by mouth every morning.    Multiple Vitamin (MULTIVITAMIN WITH MINERALS) TABS tablet Take 1 tablet by mouth every morning.    ipratropium-albuterol (DUONEB) 0.5-2.5 (3) MG/3ML SOLN Take 3 mLs by nebulization as directed. 4 times daily for 5 days patient to begin on 11/19/2013.     !! - Potential duplicate medications found. Please discuss with provider.    STOP taking these medications     cefTRIAXone (ROCEPHIN) 1 G injection      metFORMIN (GLUCOPHAGE) 1000 MG tablet      levofloxacin (LEVAQUIN) 250 MG tablet        No Known Allergies    The results of significant diagnostics from this hospitalization (including imaging, microbiology, ancillary and laboratory) are listed below for reference.    Significant Diagnostic Studies: Dg Chest 2 View  11/19/2013   CLINICAL DATA:  Hyperglycemia. Fever for 48 hr. Diagnosed with pneumonia on 10/18. Increased weakness and lethargy.  EXAM: CHEST  2 VIEW  COMPARISON:  02/23/2013  FINDINGS: Normal heart size and pulmonary vascularity. Shallow inspiration. Infiltration or atelectasis in the left lung base may be compatible with pneumonia. No blunting of costophrenic angles. No pneumothorax. Tortuous and ectatic aorta.  IMPRESSION: Infiltration or atelectasis in the left lung base may be compatible with pneumonia.   Electronically Signed   By: Burman NievesWilliam  Stevens M.D.   On: 11/19/2013 01:42    Microbiology: Recent Results (from the past 240 hour(s))  URINE CULTURE     Status: None   Collection Time    11/19/13  1:09 AM      Result Value Ref Range Status   Specimen Description URINE, RANDOM   Final   Special Requests NONE   Final   Culture  Setup Time     Final   Value: 11/19/2013 08:12     Performed at [facility]   Colony Count     Final   Value: NO GROWTH     [facility]formed at [facility]   Culture     Final   Value: NO GROWTH     Performed at [facility]   Report Status 11/20/2013 FINAL   Final  CULTURE, BLOOD (ROUTINE X 2)     Status: None   Collection Time    11/19/13  1:20 AM      Result Value Ref Range Status   Specimen Description BLOOD BLOOD LEFT FOREARM  Final   Special Requests BOTTLES DRAWN AEROBIC AND ANAEROBIC 5CC   Final   Culture  Setup Time     Final   Value: 11/19/2013 04:21     Performed at [facility]   Culture     Final   Value: NO GROWTH 5 DAYS     Performed at [facility]   Report Status 11/25/2013 FINAL   Final  CULTURE, BLOOD (ROUTINE X 2)     Status: None   Collection Time    11/19/13  1:20 AM      Result Value Ref Range Status   Specimen Description BLOOD BLOOD RIGHT FOREARM   Final   Special Requests BOTTLES DRAWN AEROBIC AND ANAEROBIC 1CC   Final   Culture  Setup Time     Final   Value: 11/19/2013 04:20     Performed at [facility]   Culture     Final   Value: NO GROWTH 5 DAYS     Performed at [facility]   Report Status 11/25/2013 FINAL   Final  MRSA PCR SCREENING     Status: Abnormal   Collection Time    11/19/13  6:30 AM      Result Value Ref Range Status   MRSA by PCR POSITIVE (*) NEGATIVE Final   Comment:            The GeneXpert MRSA Assay (FDA     approved for NASAL specimens     only), is one component of a     comprehensive MRSA colonization     surveillance program. It is not     intended to diagnose MRSA     infection nor to guide or     monitor treatment for     MRSA infections.     RESULT CALLED TO, READ BACK BY AND VERIFIED WITH:     Wardell Honour RN 11:10 11/19/13 ([email or web address])     Performed at [facility]  CLOSTRIDIUM DIFFICILE BY PCR     Status: None   Collection Time    11/21/13  5:05 PM      Result Value Ref Range Status   C difficile by pcr NEGATIVE  NEGATIVE Final    Comment: Performed at [facility]  CLOSTRIDIUM DIFFICILE BY PCR     Status: None   Collection Time    11/25/13  1:29 AM      Result Value Ref Range Status   C difficile by pcr NEGATIVE  NEGATIVE Final   Comment: Performed at [facility]     Labs: Basic Metabolic Panel:  Recent Labs Lab 11/19/13 0419 11/19/13 0503  11/20/13 0630  11/21/13 0327  11/22/13 6962  11/22/13 1704 11/23/13 0433 11/23/13 1649 11/24/13 0525 11/24/13 1655  NA 149* 149*  < > 151*  < > 152*  < > 152*  < > 153* 154* 150* 149* 145  K 4.4 3.3*  < > 4.6  < > 3.3*  < > 3.4*  < > 3.2* 2.5* 3.0* 3.3* 3.6*  CL 115* 122*  < > 119*  < > 116*  < > 114*  < > 112 112 109 109 106  CO2 11* 10*  < > 13*  < > 19  < > 21  < > 27 29 30 28 24   GLUCOSE 418* 284*  < > 169*  < > 205*  < > 255*  < > 142* 22* 77 142* 272*  BUN 74* 60*  < > 70*  < > 62*  < > 54*  < > 45* 37* 30* 24* 25*  CREATININE 4.24* 3.41*  < > 4.56*  < > 3.77*  < > 3.08*  < > 2.67* 2.43* 2.35* 2.27* 2.12*  CALCIUM 8.2* 6.2*  < > 7.8*  < > 7.9*  < > 7.9*  < > 7.8* 7.9* 7.4* 7.4* 7.3*  MG  --  1.3*  --  1.5  --  1.6  --  1.5  --   --   --   --   --   --   PHOS  --  3.0  --  4.9*  --  4.2  --  3.9  --   --   --   --   --   --   < > = values in this interval not displayed. Liver Function Tests:  Recent Labs Lab 11/19/13 0120 11/20/13 0630  AST 20 20  ALT 9 12  ALKPHOS 61 52  BILITOT 0.3 0.2*  PROT 6.5 5.6*  ALBUMIN 2.4* 2.0*   CBC:  Recent Labs Lab 11/19/13 0120 11/19/13 0503 11/19/13 0725 11/20/13 0630 11/21/13 0327 11/22/13 0305  WBC 18.3* 13.2* 14.6* 16.8* 11.9* 7.1  NEUTROABS 16.5* 11.8*  --  15.3* 10.6* 5.8  HGB 11.5* 8.0* 9.2* 9.7* 9.3* 9.7*  HCT 34.0* 23.5* 27.1* 29.0* 28.1* 28.5*  MCV 89.0 89.7 88.0 88.1 87.8 87.4  PLT 313 PLATELET CLUMPS NOTED ON SMEAR, COUNT APPEARS ADEQUATE 243 247 225 226   Cardiac Enzymes:  Recent Labs Lab 11/19/13 0503 11/19/13 1108 11/19/13 1542 11/19/13 2245  TROPONINI <0.30 <0.30  0.35* 0.31*   CBG:  Recent Labs Lab 11/24/13 2020 11/25/13 0002 11/25/13 0410 11/25/13 0743 11/25/13 1158  GLUCAP 185* 215* 163* 82 244*    Signed:  Vassie Loll  [facility] 11/25/2013, 1:13 PM

## 2013-12-31 DEATH — deceased
